# Patient Record
Sex: Female | Born: 1955
Health system: Southern US, Community
[De-identification: ages and names within clinical notes are randomized; demographics above are authoritative.]

## PROBLEM LIST (undated history)

## (undated) DIAGNOSIS — F1910 Other psychoactive substance abuse, uncomplicated: Secondary | ICD-10-CM

## (undated) DIAGNOSIS — K449 Diaphragmatic hernia without obstruction or gangrene: Secondary | ICD-10-CM

## (undated) DIAGNOSIS — M797 Fibromyalgia: Secondary | ICD-10-CM

## (undated) DIAGNOSIS — K828 Other specified diseases of gallbladder: Secondary | ICD-10-CM

## (undated) DIAGNOSIS — B182 Chronic viral hepatitis C: Secondary | ICD-10-CM

## (undated) DIAGNOSIS — G43909 Migraine, unspecified, not intractable, without status migrainosus: Secondary | ICD-10-CM

## (undated) DIAGNOSIS — M1990 Unspecified osteoarthritis, unspecified site: Secondary | ICD-10-CM

## (undated) DIAGNOSIS — G119 Hereditary ataxia, unspecified: Secondary | ICD-10-CM

## (undated) DIAGNOSIS — K7460 Unspecified cirrhosis of liver: Secondary | ICD-10-CM

## (undated) DIAGNOSIS — D472 Monoclonal gammopathy: Secondary | ICD-10-CM

## (undated) DIAGNOSIS — F418 Other specified anxiety disorders: Secondary | ICD-10-CM

## (undated) DIAGNOSIS — R270 Ataxia, unspecified: Secondary | ICD-10-CM

## (undated) DIAGNOSIS — K766 Portal hypertension: Secondary | ICD-10-CM

## (undated) HISTORY — DX: Fibromyalgia: M79.7

## (undated) HISTORY — DX: Monoclonal gammopathy: D47.2

## (undated) HISTORY — DX: Other psychoactive substance abuse, uncomplicated: F19.10

## (undated) HISTORY — DX: Diaphragmatic hernia without obstruction or gangrene: K44.9

## (undated) HISTORY — DX: Unspecified cirrhosis of liver: K74.60

## (undated) HISTORY — DX: Chronic viral hepatitis C: B18.2

## (undated) HISTORY — DX: Other specified anxiety disorders: F41.8

## (undated) HISTORY — DX: Other specified diseases of gallbladder: K82.8

## (undated) HISTORY — DX: Portal hypertension: K76.6

## (undated) HISTORY — DX: Migraine, unspecified, not intractable, without status migrainosus: G43.909

## (undated) HISTORY — DX: Hereditary ataxia, unspecified: G11.9

## (undated) HISTORY — DX: Unspecified osteoarthritis, unspecified site: M19.90

---

## 1977-10-06 HISTORY — PX: TUBAL LIGATION: SHX77

## 1994-10-06 HISTORY — PX: BREAST ENHANCEMENT SURGERY: SHX7

## 2009-10-06 HISTORY — PX: ANKLE SURGERY: SHX546

## 2014-10-06 HISTORY — PX: WRIST SURGERY: SHX841

## 2019-08-06 ENCOUNTER — Emergency Department (HOSPITAL_COMMUNITY)
Admission: EM | Admit: 2019-08-06 | Discharge: 2019-08-06 | Disposition: A | Discharge status: Home / Self Care | Payer: Medicare HMO | Attending: Emergency Medicine | Admitting: Emergency Medicine

## 2019-08-06 ENCOUNTER — Emergency Department (HOSPITAL_COMMUNITY): Payer: Medicare HMO

## 2019-08-06 ENCOUNTER — Encounter (HOSPITAL_COMMUNITY): Payer: Self-pay | Admitting: Emergency Medicine

## 2019-08-06 ENCOUNTER — Other Ambulatory Visit: Payer: Self-pay

## 2019-08-06 DIAGNOSIS — Y9389 Activity, other specified: Secondary | ICD-10-CM | POA: Insufficient documentation

## 2019-08-06 DIAGNOSIS — R519 Headache, unspecified: Secondary | ICD-10-CM | POA: Insufficient documentation

## 2019-08-06 DIAGNOSIS — R260 Ataxic gait: Secondary | ICD-10-CM | POA: Diagnosis not present

## 2019-08-06 DIAGNOSIS — R42 Dizziness and giddiness: Secondary | ICD-10-CM | POA: Insufficient documentation

## 2019-08-06 DIAGNOSIS — Z79899 Other long term (current) drug therapy: Secondary | ICD-10-CM | POA: Diagnosis not present

## 2019-08-06 DIAGNOSIS — Z91040 Latex allergy status: Secondary | ICD-10-CM | POA: Insufficient documentation

## 2019-08-06 DIAGNOSIS — R0789 Other chest pain: Secondary | ICD-10-CM | POA: Insufficient documentation

## 2019-08-06 DIAGNOSIS — Z043 Encounter for examination and observation following other accident: Secondary | ICD-10-CM | POA: Diagnosis not present

## 2019-08-06 DIAGNOSIS — Y999 Unspecified external cause status: Secondary | ICD-10-CM | POA: Insufficient documentation

## 2019-08-06 DIAGNOSIS — W010XXA Fall on same level from slipping, tripping and stumbling without subsequent striking against object, initial encounter: Secondary | ICD-10-CM | POA: Diagnosis not present

## 2019-08-06 DIAGNOSIS — Y92039 Unspecified place in apartment as the place of occurrence of the external cause: Secondary | ICD-10-CM | POA: Diagnosis not present

## 2019-08-06 DIAGNOSIS — W19XXXA Unspecified fall, initial encounter: Secondary | ICD-10-CM

## 2019-08-06 DIAGNOSIS — T148XXA Other injury of unspecified body region, initial encounter: Secondary | ICD-10-CM | POA: Insufficient documentation

## 2019-08-06 HISTORY — DX: Ataxia, unspecified: R27.0

## 2019-08-06 NOTE — ED Triage Notes (Signed)
Pt was carrying a laundry basket in the rain, tripped and fell 10/29. Denies LOC, dizziness, blurry vision. Pt has facial edema, redness, and lac to R face and forehead. Pt also has bruising on R rib cage. Endorses headache and neck pain. Pt says she had 1 emesis episode on same day she fell, has not had another since.

## 2019-08-06 NOTE — ED Provider Notes (Signed)
[facility] EMERGENCY DEPARTMENT Provider Note   CSN: [id number] Arrival date & time: 08/06/19  1639     History   Chief Complaint Chief Complaint  Patient presents with   Fall    HPI Kelly Singh is a 63 y.o. female with past medical history significant for ataxia who presents to the ED after sustaining an unwitnessed mechanical fall on 08/04/2019.  She reports that she was carrying a laundry basket into her condo 2 days ago when she tripped and fell.  She admits that she frequently sustains these mechanical falls due to her diagnosed ataxic neurologic condition.  She had been seeing a neurologist in [place], but has not been established with a neurologist since moving to [place] earlier this year.  She also does not have a PCP at the moment.  She endorses worsening dizziness, one episode of nausea and vomiting, and an unrelenting headache, but denies any visual changes, loss of consciousness, memory disturbance, seizure activity, incontinence, tongue biting, confusion, altered mental status, or any new or worsening neurologic deficits.  She sustained abrasions to her right cheek, right forehead, and right forearm.  She also complains of right-sided rib discomfort.  She denies any difficulty breathing or pain with inspiration.  She is accompanied by her daughter who is most concerned for a brain bleed due to her worsening dizziness and persistent headache.  Daughter insisted that her mother be evaluated on the day of her injury, but she has been reluctant.  Patient reports that her pain is a 5 out of 10 and she has not taken anything for her symptoms.  She reports that she had irrigated and even debrided her abrasions.     HPI  Past Medical History:  Diagnosis Date   Ataxia     There are no active problems to display for this patient.   History reviewed. No pertinent surgical history.   OB History   No obstetric history on file.      Home  Medications    Prior to Admission medications   Medication Sig Start Date End Date Taking? Authorizing Provider  Apoaequorin (PREVAGEN PO) Take 1 tablet by mouth daily.   Yes [provider]  HYDROcodone-acetaminophen (NORCO) 10-325 MG tablet Take 1 tablet by mouth every 4 (four) hours as needed (pain).   Yes [provider]  Multiple Vitamin (MULTIVITAMIN WITH MINERALS) TABS tablet Take 1 tablet by mouth daily.   Yes [provider]  sertraline (ZOLOFT) 50 MG tablet Take 50 mg by mouth every morning.   Yes [provider]  vitamin E 1000 UNIT capsule Take 1,000 Units by mouth daily.   Yes [provider]    Family History History reviewed. No pertinent family history.  Social History Social History   Tobacco Use   Smoking status: Never Smoker   Smokeless tobacco: Never Used  Substance Use Topics   Alcohol use: Never    Frequency: Never   Drug use: Not on file     Allergies   Adhesive [tape] and Latex   Review of Systems Review of Systems  All other systems reviewed and are negative.    Physical Exam Updated Vital Signs BP 122/72    Pulse 73    Temp (!) 97.5 F (36.4 C) (Oral)    Resp 16    Ht 5\' 6"  (1.676 m)    Wt 61.2 kg    SpO2 97%    BMI 21.79 kg/m   Physical Exam Vitals signs  and nursing note reviewed. Exam conducted with a chaperone present.  Constitutional:      Appearance: Normal appearance.  HENT:     Head: Normocephalic.     Comments: No palpable skull defect, battle sign, hemotympanum, or other evidence of basilar skull fracture.  No CSF otorrhea or rhinorrhea.    Nose: Nose normal.     Mouth/Throat:     Pharynx: Oropharynx is clear.  Eyes:     Comments: Right eye: Significant ecchymoses and surrounding abrasions.  No nystagmus.  EOMs intact.  PERRL.  Neck:     Comments: Range of motion fully intact.  No significant midline tenderness to palpation, but mild TTP over trapezial regions  bilaterally. Pulmonary:     Effort: Pulmonary effort is normal.  Musculoskeletal:     Comments: Torso: Tenderness to palpation focally over lateral aspect 6 through 8 ribs on right side of chest wall.  Right forearm: 10 cm well-healing abrasion.  No evidence of surrounding erythema, swelling, induration, or other evidence of infection.  Skin:    General: Skin is dry.  Neurological:     General: No focal deficit present.     Mental Status: She is alert.     GCS: GCS eye subscore is 4. GCS verbal subscore is 5. GCS motor subscore is 6.     Cranial Nerves: No cranial nerve deficit.     Sensory: No sensory deficit.     Motor: No weakness.     Coordination: Coordination normal.     Comments: Ataxic gait.  Psychiatric:        Mood and Affect: Mood normal.        Behavior: Behavior normal.        Thought Content: Thought content normal.      ED Treatments / Results  Labs (all labs ordered are listed, but only abnormal results are displayed) Labs Reviewed - No data to display  EKG None  Radiology Ct Head Wo Contrast  Result Date: 08/06/2019 CLINICAL DATA:  Acute pain due to trauma. Facial edema with laceration to right face and forehead. EXAM: CT HEAD WITHOUT CONTRAST CT MAXILLOFACIAL WITHOUT CONTRAST CT CERVICAL SPINE WITHOUT CONTRAST TECHNIQUE: Multidetector CT imaging of the head, cervical spine, and maxillofacial structures were performed using the standard protocol without intravenous contrast. Multiplanar CT image reconstructions of the cervical spine and maxillofacial structures were also generated. COMPARISON:  None. FINDINGS: CT HEAD FINDINGS Brain: No evidence of acute infarction, hemorrhage, hydrocephalus, extra-axial collection or mass lesion/mass effect. Vascular: No hyperdense vessel or unexpected calcification. Skull: Normal. Negative for fracture or focal lesion. Other: None. CT MAXILLOFACIAL FINDINGS Osseous: No fracture or mandibular dislocation. No destructive process.  Orbits: Negative. No traumatic or inflammatory finding. Sinuses: Clear. Soft tissues: There is right facial soft tissue swelling without evidence for a radiopaque foreign body. There is right periorbital soft tissue swelling without evidence for an underlying fracture. CT CERVICAL SPINE FINDINGS Alignment: Normal. Skull base and vertebrae: No acute fracture. No primary bone lesion or focal pathologic process. Soft tissues and spinal canal: No prevertebral fluid or swelling. No visible canal hematoma. Disc levels: Multilevel disc height loss is noted throughout the cervical spine. Upper chest: Negative. Other: None IMPRESSION: 1. No acute intracranial abnormality detected. 2. No acute cervical spine fracture. 3. Right facial soft tissue swelling without evidence for an associated fracture or radiopaque foreign body. Electronically Signed   By: Constance Holster M.D.   On: 08/06/2019 20:48   Ct Chest Wo Contrast  Result  Date: 08/06/2019 CLINICAL DATA:  Pain after fall.  Suspected rib fractures. EXAM: CT CHEST WITHOUT CONTRAST TECHNIQUE: Multidetector CT imaging of the chest was performed following the standard protocol without IV contrast. COMPARISON:  None. FINDINGS: Cardiovascular: The thoracic aorta is nonaneurysmal without significant atherosclerotic change. The heart size is normal. No obvious coronary artery calcifications. Central pulmonary arteries are normal in caliber. Mediastinum/Nodes: The thyroid and esophagus are normal. No adenopathy seen in the chest. No effusions. The patient is status post bilateral implant placements. Lungs/Pleura: Central airways are normal. No pneumothorax. Several ground-glass nodules are seen in the lungs. A representative ground-glass nodule in the left apex measures 6 mm on series 4, image 23. A few other smaller ground-glass nodules are seen in the right upper lobe. No solid nodules are seen on today's study. No focal infiltrates are noted. Upper Abdomen: A cyst in the  upper left abdomen is probably in the superior pole of the left kidney is seen on series 3, image 143, incompletely characterized. No other abnormalities in the upper abdomen identified. Musculoskeletal: There appears to be a healed posterior eleventh left rib fracture as seen on series 4, image 114. There may also be a healed left posterior tenth rib fracture with callus formation. No acute fractures are seen. Degenerative changes are seen in the thoracic spine without an identified fracture. IMPRESSION: 1. No acute rib fractures are noted. There appear to be healed rib fractures with callus formation in the posterior left tenth and eleventh ribs. No pneumothorax. 2. Several ground-glass nodules are seen in the lung apices with a representative nodule in the left measuring 6 mm. Non-contrast chest CT at 3-6 months is recommended. If nodules persist, subsequent management will be based upon the most suspicious nodule(s). This recommendation follows the consensus statement: Guidelines for Management of Incidental Pulmonary Nodules Detected on CT Images: From the Fleischner Society 2017; Radiology 2017; 284:228-243. 3. Probable cyst in the upper pole the left kidney, incompletely characterized. 4. No other acute abnormalities. Electronically Signed   By: Dorise Bullion III M.D   On: 08/06/2019 20:48   Ct Cervical Spine Wo Contrast  Result Date: 08/06/2019 CLINICAL DATA:  Acute pain due to trauma. Facial edema with laceration to right face and forehead. EXAM: CT HEAD WITHOUT CONTRAST CT MAXILLOFACIAL WITHOUT CONTRAST CT CERVICAL SPINE WITHOUT CONTRAST TECHNIQUE: Multidetector CT imaging of the head, cervical spine, and maxillofacial structures were performed using the standard protocol without intravenous contrast. Multiplanar CT image reconstructions of the cervical spine and maxillofacial structures were also generated. COMPARISON:  None. FINDINGS: CT HEAD FINDINGS Brain: No evidence of acute infarction,  hemorrhage, hydrocephalus, extra-axial collection or mass lesion/mass effect. Vascular: No hyperdense vessel or unexpected calcification. Skull: Normal. Negative for fracture or focal lesion. Other: None. CT MAXILLOFACIAL FINDINGS Osseous: No fracture or mandibular dislocation. No destructive process. Orbits: Negative. No traumatic or inflammatory finding. Sinuses: Clear. Soft tissues: There is right facial soft tissue swelling without evidence for a radiopaque foreign body. There is right periorbital soft tissue swelling without evidence for an underlying fracture. CT CERVICAL SPINE FINDINGS Alignment: Normal. Skull base and vertebrae: No acute fracture. No primary bone lesion or focal pathologic process. Soft tissues and spinal canal: No prevertebral fluid or swelling. No visible canal hematoma. Disc levels: Multilevel disc height loss is noted throughout the cervical spine. Upper chest: Negative. Other: None IMPRESSION: 1. No acute intracranial abnormality detected. 2. No acute cervical spine fracture. 3. Right facial soft tissue swelling without evidence for an associated fracture  or radiopaque foreign body. Electronically Signed   By: Constance Holster M.D.   On: 08/06/2019 20:48   Ct Maxillofacial Wo Contrast  Result Date: 08/06/2019 CLINICAL DATA:  Acute pain due to trauma. Facial edema with laceration to right face and forehead. EXAM: CT HEAD WITHOUT CONTRAST CT MAXILLOFACIAL WITHOUT CONTRAST CT CERVICAL SPINE WITHOUT CONTRAST TECHNIQUE: Multidetector CT imaging of the head, cervical spine, and maxillofacial structures were performed using the standard protocol without intravenous contrast. Multiplanar CT image reconstructions of the cervical spine and maxillofacial structures were also generated. COMPARISON:  None. FINDINGS: CT HEAD FINDINGS Brain: No evidence of acute infarction, hemorrhage, hydrocephalus, extra-axial collection or mass lesion/mass effect. Vascular: No hyperdense vessel or unexpected  calcification. Skull: Normal. Negative for fracture or focal lesion. Other: None. CT MAXILLOFACIAL FINDINGS Osseous: No fracture or mandibular dislocation. No destructive process. Orbits: Negative. No traumatic or inflammatory finding. Sinuses: Clear. Soft tissues: There is right facial soft tissue swelling without evidence for a radiopaque foreign body. There is right periorbital soft tissue swelling without evidence for an underlying fracture. CT CERVICAL SPINE FINDINGS Alignment: Normal. Skull base and vertebrae: No acute fracture. No primary bone lesion or focal pathologic process. Soft tissues and spinal canal: No prevertebral fluid or swelling. No visible canal hematoma. Disc levels: Multilevel disc height loss is noted throughout the cervical spine. Upper chest: Negative. Other: None IMPRESSION: 1. No acute intracranial abnormality detected. 2. No acute cervical spine fracture. 3. Right facial soft tissue swelling without evidence for an associated fracture or radiopaque foreign body. Electronically Signed   By: Constance Holster M.D.   On: 08/06/2019 20:48    Procedures Procedures (including critical care time)  Medications Ordered in ED Medications - No data to display   Initial Impression / Assessment and Plan / ED Course  I have reviewed the triage vital signs and the nursing notes.  Pertinent labs & imaging results that were available during my care of the patient were reviewed by me and considered in my medical decision making (see chart for details).        While a muscle relaxer such as Robaxin would help her with her trapezial discomfort, discussed why I did not feel would be a wise decision based on her frequent falls and ataxia.  Also would like to avoid narcotics given their sedating nature.  Instead, will prescribe Tylenol 500 mg as needed for any continued pain and discomfort.  Patient had not yet taken anything for her pain and was agreeable to this plan.  Patient's  abrasions are healing well and demonstrate no evidence of erythema, swelling, induration, or other signs of infections.  Will provide information for continued wound management and provided them with return precautions.  Do not feel as though prophylactic antibiotics are warranted at this time.  Reviewed and interpreted interpreted CT imaging obtained at the head, maxillofacial, C-spine, and chest which were reviewed no acute bleeds, fractures, subluxation, or dislocation. Her CT chest did however-year-old several groundglass nodules in the lung apices with a nodule measuring 6 mm and that radiology recommends repeat noncontrast CT chest in 3 to 6 months.  Informed the patient and her daughter and they voiced understanding and agreement to the plan.  Her daughter plans to get the patient established with a neurologist for her ongoing ataxic neurologic disorder and patient agrees to get established with a PCP here in [place].  Patient was relieved that there were no acute abnormalities.  She declined any PO pain medications for discharge.  Instructed patient to return to the ED or seek medical attention for any new or worsening symptoms.  Final Clinical Impressions(s) / ED Diagnoses   Final diagnoses:  Fall, initial encounter    ED Discharge Orders    None       Reita Chard 08/06/19 2113    Milton Ferguson, MD 08/07/19 1253

## 2019-08-06 NOTE — Discharge Instructions (Addendum)
Return to ED or seek medical attention for any new or worsening symptoms.  Please have a repeat noncontrast chest CT in 3 to 6 months to reevaluate the incidental 6 mm lung nodule.  Be sure to get established with a PCP as well as a neurologist for your ongoing ataxic neurologic disorder as soon as possible.

## 2019-08-29 ENCOUNTER — Other Ambulatory Visit: Payer: Self-pay

## 2019-08-29 DIAGNOSIS — Z20822 Contact with and (suspected) exposure to covid-19: Secondary | ICD-10-CM

## 2019-08-30 LAB — NOVEL CORONAVIRUS, NAA: SARS-CoV-2, NAA: NOT DETECTED

## 2019-09-02 ENCOUNTER — Telehealth: Payer: Self-pay | Admitting: *Deleted

## 2019-09-02 NOTE — Telephone Encounter (Signed)
Patient called and given negative covid results .

## 2019-09-23 ENCOUNTER — Encounter: Payer: Self-pay | Admitting: Oncology

## 2019-09-23 ENCOUNTER — Telehealth: Payer: Self-pay | Admitting: Oncology

## 2019-09-23 NOTE — Telephone Encounter (Signed)
A new hem appt has been scheduled for Kelly Singh to see Dr. Alen Blew on 1/7 at 11am. Pt aware to arrive 15 minutes early. Letter mailed.

## 2019-10-13 ENCOUNTER — Other Ambulatory Visit: Payer: Self-pay

## 2019-10-13 ENCOUNTER — Inpatient Hospital Stay: Payer: Medicare Other | Attending: Oncology | Admitting: Oncology

## 2019-10-13 ENCOUNTER — Inpatient Hospital Stay: Payer: Medicare Other

## 2019-10-13 VITALS — BP 152/79 | HR 70 | Temp 97.7°F | Resp 16 | Ht 66.0 in | Wt 135.1 lb

## 2019-10-13 DIAGNOSIS — F329 Major depressive disorder, single episode, unspecified: Secondary | ICD-10-CM | POA: Insufficient documentation

## 2019-10-13 DIAGNOSIS — D472 Monoclonal gammopathy: Secondary | ICD-10-CM | POA: Diagnosis not present

## 2019-10-13 DIAGNOSIS — Z79899 Other long term (current) drug therapy: Secondary | ICD-10-CM | POA: Diagnosis not present

## 2019-10-13 DIAGNOSIS — F419 Anxiety disorder, unspecified: Secondary | ICD-10-CM | POA: Diagnosis not present

## 2019-10-13 DIAGNOSIS — R270 Ataxia, unspecified: Secondary | ICD-10-CM | POA: Diagnosis not present

## 2019-10-13 DIAGNOSIS — R911 Solitary pulmonary nodule: Secondary | ICD-10-CM | POA: Diagnosis not present

## 2019-10-13 LAB — CBC WITH DIFFERENTIAL (CANCER CENTER ONLY)
Abs Immature Granulocytes: 0.01 10*3/uL (ref 0.00–0.07)
Basophils Absolute: 0 10*3/uL (ref 0.0–0.1)
Basophils Relative: 1 %
Eosinophils Absolute: 0.1 10*3/uL (ref 0.0–0.5)
Eosinophils Relative: 3 %
HCT: 43.7 % (ref 36.0–46.0)
Hemoglobin: 14.7 g/dL (ref 12.0–15.0)
Immature Granulocytes: 0 %
Lymphocytes Relative: 37 %
Lymphs Abs: 1.1 10*3/uL (ref 0.7–4.0)
MCH: 32.8 pg (ref 26.0–34.0)
MCHC: 33.6 g/dL (ref 30.0–36.0)
MCV: 97.5 fL (ref 80.0–100.0)
Monocytes Absolute: 0.2 10*3/uL (ref 0.1–1.0)
Monocytes Relative: 7 %
Neutro Abs: 1.5 10*3/uL — ABNORMAL LOW (ref 1.7–7.7)
Neutrophils Relative %: 52 %
Platelet Count: 99 10*3/uL — ABNORMAL LOW (ref 150–400)
RBC: 4.48 MIL/uL (ref 3.87–5.11)
RDW: 13.8 % (ref 11.5–15.5)
WBC Count: 3 10*3/uL — ABNORMAL LOW (ref 4.0–10.5)
nRBC: 0 % (ref 0.0–0.2)

## 2019-10-13 LAB — CMP (CANCER CENTER ONLY)
ALT: 63 U/L — ABNORMAL HIGH (ref 0–44)
AST: 68 U/L — ABNORMAL HIGH (ref 15–41)
Albumin: 3.3 g/dL — ABNORMAL LOW (ref 3.5–5.0)
Alkaline Phosphatase: 146 U/L — ABNORMAL HIGH (ref 38–126)
Anion gap: 7 (ref 5–15)
BUN: 11 mg/dL (ref 8–23)
CO2: 28 mmol/L (ref 22–32)
Calcium: 8 mg/dL — ABNORMAL LOW (ref 8.9–10.3)
Chloride: 104 mmol/L (ref 98–111)
Creatinine: 0.73 mg/dL (ref 0.44–1.00)
GFR, Est AFR Am: >60 mL/min (ref >60)
GFR, Estimated: >60 mL/min (ref >60)
Glucose, Bld: 98 mg/dL (ref 70–99)
Potassium: 4 mmol/L (ref 3.5–5.1)
Sodium: 139 mmol/L (ref 135–145)
Total Bilirubin: 2.1 mg/dL — ABNORMAL HIGH (ref 0.3–1.2)
Total Protein: 7.1 g/dL (ref 6.5–8.1)

## 2019-10-13 NOTE — Progress Notes (Signed)
Reason for the request:   Plasma cell disorder  HPI: I was asked by Dr. Dr. Arline Asp to evaluate Ms. Hansley for the evaluation of MGUS.  She is 64 year old woman with history of anxiety and depression as well as ataxia who established care here recently.  She was also with seen in the emergency department on August 06, 2019 after she sustained a fall related to her ataxia.  Imaging studies including a CT scan of the cervical spine, chest, and  maxillofacial CT scan showed no evidence of fractures.  She did have 6 mm nodule in the left lung associated with groundglass opacities.  No evidence of any malignancy noted at this time.  She reports has a history of fall MGUS that is a biclonal dating back to 2011.  She has relocated from [place] but also lived in [place] where she was diagnosed with this condition.  She has not required any treatment and has been on observation and surveillance.  She does not have all her records at this time but does indicate that she has had a biclonal gammopathy at that time.  She does not report any bone pain or pathological fractures at this time.  She denies any recurrent infections or worsening neuropathy.  She still have chronic instability related to her ataxia and neurological condition.   She does not report any headaches, blurry vision, syncope or seizures. Does not report any fevers, chills or sweats.  Does not report any cough, wheezing or hemoptysis.  Does not report any chest pain, palpitation, orthopnea or leg edema.  Does not report any nausea, vomiting or abdominal pain.  Does not report any constipation or diarrhea.  Does not report any skeletal complaints.    Does not report frequency, urgency or hematuria.  Does not report any skin rashes or lesions. Does not report any heat or cold intolerance.  Does not report any lymphadenopathy or petechiae.  Does not report any anxiety or depression.  Remaining review of systems is negative.    Past Medical History:   Diagnosis Date  . Ataxia   :  No past surgical history on file.:   Current Outpatient Medications:  .  Apoaequorin (PREVAGEN PO), Take 1 tablet by mouth daily., Disp: , Rfl:  .  HYDROcodone-acetaminophen (NORCO) 10-325 MG tablet, Take 1 tablet by mouth every 4 (four) hours as needed (pain)., Disp: , Rfl:  .  Multiple Vitamin (MULTIVITAMIN WITH MINERALS) TABS tablet, Take 1 tablet by mouth daily., Disp: , Rfl:  .  sertraline (ZOLOFT) 50 MG tablet, Take 50 mg by mouth every morning., Disp: , Rfl:  .  vitamin E 1000 UNIT capsule, Take 1,000 Units by mouth daily., Disp: , Rfl: :  Allergies  Allergen Reactions  . Adhesive [Tape] Other (See Comments)    Plastic tape eats through skin - pls use paper tape  . Latex Other (See Comments)    Eats through skin  :  No family history on file.:  Social History   Socioeconomic History  . Marital status: Single    Spouse name: Not on file  . Number of children: Not on file  . Years of education: Not on file  . Highest education level: Not on file  Occupational History  . Not on file  Tobacco Use  . Smoking status: Never Smoker  . Smokeless tobacco: Never Used  Substance and Sexual Activity  . Alcohol use: Never  . Drug use: Not on file  . Sexual activity: Not on  file  Other Topics Concern  . Not on file  Social History Narrative  . Not on file   Social Determinants of Health   Financial Resource Strain:   . Difficulty of Paying Living Expenses: Not on file  Food Insecurity:   . Worried About Charity fundraiser in the Last Year: Not on file  . Ran Out of Food in the Last Year: Not on file  Transportation Needs:   . Lack of Transportation (Medical): Not on file  . Lack of Transportation (Non-Medical): Not on file  Physical Activity:   . Days of Exercise per Week: Not on file  . Minutes of Exercise per Session: Not on file  Stress:   . Feeling of Stress : Not on file  Social Connections:   . Frequency of Communication  with Friends and Family: Not on file  . Frequency of Social Gatherings with Friends and Family: Not on file  . Attends Religious Services: Not on file  . Active Member of Clubs or Organizations: Not on file  . Attends Archivist Meetings: Not on file  . Marital Status: Not on file  Intimate Partner Violence:   . Fear of Current or Ex-Partner: Not on file  . Emotionally Abused: Not on file  . Physically Abused: Not on file  . Sexually Abused: Not on file  :  Pertinent items are noted in HPI.  Exam: Blood pressure (!) 152/79, pulse 70, temperature 97.7 F (36.5 C), temperature source Temporal, resp. rate 16, height _0  (1.676 m), weight 135 lb 1.6 oz (61.3 kg), SpO2 99 %.  ECOG 1  General appearance: alert and cooperative appeared without distress. Head: atraumatic without any abnormalities. Eyes: conjunctivae/corneas clear. PERRL.  Sclera anicteric. Throat: lips, mucosa, and tongue normal; without oral thrush or ulcers. Resp: clear to auscultation bilaterally without rhonchi, wheezes or dullness to percussion. Cardio: regular rate and rhythm, S1, S2 normal, no murmur, click, rub or gallop GI: soft, non-tender; bowel sounds normal; no masses,  no organomegaly Skin: Skin color, texture, turgor normal. No rashes or lesions Lymph nodes: Cervical, supraclavicular, and axillary nodes normal. Neurologic: Grossly normal without any motor, sensory or deep tendon reflexes. Musculoskeletal: No joint deformity or effusion.  IMPRESSION: 1. No acute rib fractures are noted. There appear to be healed rib fractures with callus formation in the posterior left tenth and eleventh ribs. No pneumothorax. 2. Several ground-glass nodules are seen in the lung apices with a representative nodule in the left measuring 6 mm. Non-contrast chest CT at 3-6 months is recommended. If nodules persist, subsequent management will be based upon the most suspicious nodule(s). This recommendation  follows the consensus statement: Guidelines for Management of Incidental Pulmonary Nodules Detected on CT Images: From the Fleischner Society 2017; Radiology 2017; 284:228-243. 3. Probable cyst in the upper pole the left kidney, incompletely characterized. 4. No other acute abnormalities.   Assessment and Plan:   64 year old woman with:  1.   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  She has been on active surveillance and has not required any treatment or any indication that she has developed multiple myeloma or symptomatic plasma cell disorder.  She relocated from [facility] and currently here to establish care.  The natural course of this disease as well as differential diagnosis was reviewed today with the patient and her daughter.  Biclonal gammopathy of undetermined significance is a possibility.  Reactive elevation in her immunoglobulins related to autoimmune disease  could also be a consideration.  Multiple myeloma as well as amyloidosis are considered less likely at this time.  I have recommended continued active surveillance with repeat serum protein electrophoresis as well as quantitative immunoglobulins and serum light chains in addition to a CBC and chemistries.  The interventional surveillance can range between 3 to 12 months.  We will start with 3 months and increase the frequency pending the stability of her counts.  She reports that she had a bone marrow biopsy in 2016 I see no indication to repeat it at this time.  2.  Lung nodules noted on a CT scan in October 2020.  She had a 6 mm left lung which could represent malignancy at this time.  The plan is to repeat imaging studies in 3 months from now which would be 6 months from her previous one to ensure stability.  3.  Follow-up: Will be in 3 months for repeat evaluation.  45  minutes was spent with on this encounter.  The time was dedicated to reviewing imaging studies, discussing  differential diagnosis and management options for the future.   Thank you for the referral. A copy of this consult has been forwarded to the requesting physician.

## 2019-10-14 ENCOUNTER — Telehealth: Payer: Self-pay | Admitting: Oncology

## 2019-10-14 LAB — KAPPA/LAMBDA LIGHT CHAINS
Kappa free light chain: 29.7 mg/L — ABNORMAL HIGH (ref 3.3–19.4)
Kappa, lambda light chain ratio: 1.87 — ABNORMAL HIGH (ref 0.26–1.65)
Lambda free light chains: 15.9 mg/L (ref 5.7–26.3)

## 2019-10-14 NOTE — Telephone Encounter (Signed)
Scheduled appt per 1/7 los.  Sent a message to HIM pool to get a calendar mailed out. 

## 2019-10-17 LAB — MULTIPLE MYELOMA PANEL, SERUM
Albumin SerPl Elph-Mcnc: 3.2 g/dL (ref 2.9–4.4)
Albumin/Glob SerPl: 0.9 (ref 0.7–1.7)
Alpha 1: 0.2 g/dL (ref 0.0–0.4)
Alpha2 Glob SerPl Elph-Mcnc: 0.5 g/dL (ref 0.4–1.0)
B-Globulin SerPl Elph-Mcnc: 0.6 g/dL — ABNORMAL LOW (ref 0.7–1.3)
Gamma Glob SerPl Elph-Mcnc: 2.5 g/dL — ABNORMAL HIGH (ref 0.4–1.8)
Globulin, Total: 3.8 g/dL (ref 2.2–3.9)
IgA: 194 mg/dL (ref 87–352)
IgG (Immunoglobin G), Serum: 2459 mg/dL — ABNORMAL HIGH (ref 586–1602)
IgM (Immunoglobulin M), Srm: 166 mg/dL (ref 26–217)
M Protein SerPl Elph-Mcnc: 1 g/dL — ABNORMAL HIGH
Total Protein ELP: 7 g/dL (ref 6.0–8.5)

## 2019-10-18 ENCOUNTER — Other Ambulatory Visit: Payer: Self-pay | Admitting: Internal Medicine

## 2019-10-18 ENCOUNTER — Other Ambulatory Visit (HOSPITAL_COMMUNITY): Payer: Self-pay | Admitting: Internal Medicine

## 2019-10-18 DIAGNOSIS — R1310 Dysphagia, unspecified: Secondary | ICD-10-CM

## 2019-10-25 ENCOUNTER — Other Ambulatory Visit: Payer: Self-pay

## 2019-10-25 ENCOUNTER — Ambulatory Visit (HOSPITAL_COMMUNITY)
Admission: RE | Admit: 2019-10-25 | Discharge: 2019-10-25 | Disposition: A | Discharge status: Home / Self Care | Payer: Medicare Other | Source: Ambulatory Visit | Attending: Internal Medicine | Admitting: Internal Medicine

## 2019-10-25 DIAGNOSIS — R1310 Dysphagia, unspecified: Secondary | ICD-10-CM | POA: Insufficient documentation

## 2019-11-01 ENCOUNTER — Encounter: Payer: Self-pay | Admitting: Family

## 2019-11-01 ENCOUNTER — Other Ambulatory Visit: Payer: Self-pay

## 2019-11-01 ENCOUNTER — Telehealth: Payer: Self-pay | Admitting: Pharmacy Technician

## 2019-11-01 ENCOUNTER — Ambulatory Visit (INDEPENDENT_AMBULATORY_CARE_PROVIDER_SITE_OTHER): Payer: Medicare Other | Admitting: Family

## 2019-11-01 VITALS — BP 130/87 | HR 68 | Temp 98.2°F | Ht 66.0 in | Wt 134.0 lb

## 2019-11-01 DIAGNOSIS — B182 Chronic viral hepatitis C: Secondary | ICD-10-CM | POA: Diagnosis not present

## 2019-11-01 NOTE — Assessment & Plan Note (Signed)
Kelly Singh is a 64 y/o female with chronic hepatitis C without hepatic coma with initial viral load of 2 million. She is treatment naive and is currently asymptomatic with no previous personal or family history of liver disease. Largest risk factor appears to be age although does have tattoos and has shared razors in the past. Discussed the pathophysiology, transmission, prevention, risks if left untreated and treatment options for Hepatitis C. We discussed the plan of care with time for questions. Will check blood work today. She met with our pharmacy staff for medication assistance. Will plan for treatment with Epclusa or Mavyret. Plan for follow up in 1 month after starting medication.

## 2019-11-01 NOTE — Progress Notes (Signed)
Subjective:    Patient ID: Kelly Singh, female    DOB: 07-27-1956, 64 y.o.   MRN: [id number]  Chief Complaint  Patient presents with  . Hepatitis C    HPI:  Kelly Singh is a 64 y.o. female with previous medical history significant for anxiety, depression, cerebellar ataxia, lung nodule, and monoclonal gammopathy presenting today for evaluation and treatment of hepatitis C.  Kelly Singh was recently seen by her primary care team for routine medical care with screening for hepatitis C as she was born between [birth year] and 1965.  Her hepatitis C antibody returned positive with additional testing showing a viral load of 2 million.  This was the first that she has been diagnosed with hepatitis C.  Risk factors include tattoos and sharing of razors in the past.  Denies any previous IV drug use, blood transfusion prior to 1992, or sexual contact with a known positive partner. She has had botox injections in the past as well.  She has not received treatment for hepatitis C since initial diagnosis.  No personal or family history of liver disease.  Currently asymptomatic and denies any previous jaundice, scleral icterus, or other gastrointestinal symptoms.  No recreational or illicit drug use or tobacco use.  Does drink alcohol on occasion.  Her current hepatitis B status is unknown.  Allergies  Allergen Reactions  . Adhesive [Tape] Other (See Comments)    Plastic tape eats through skin - pls use paper tape  . Latex Other (See Comments)    Eats through skin      Outpatient Medications Prior to Visit  Medication Sig Dispense Refill  . baclofen (LIORESAL) 20 MG tablet Take 20 mg by mouth 3 (three) times daily.    Marland Kitchen HYDROcodone-acetaminophen (NORCO) 10-325 MG tablet Take 1 tablet by mouth every 4 (four) hours as needed (pain).    . Multiple Vitamin (MULTIVITAMIN WITH MINERALS) TABS tablet Take 1 tablet by mouth daily.    . sertraline (ZOLOFT) 50 MG tablet Take 50 mg by mouth every morning.     Marland Kitchen Apoaequorin (PREVAGEN PO) Take 1 tablet by mouth daily.    . vitamin E 1000 UNIT capsule Take 1,000 Units by mouth daily.     No facility-administered medications prior to visit.     Past Medical History:  Diagnosis Date  . Ataxia       History reviewed. No pertinent surgical history.    Family History  Problem Relation Age of Onset  . Cancer Mother   . Cancer Father       Social History   Socioeconomic History  . Marital status: Single    Spouse name: Not on file  . Number of children: Not on file  . Years of education: Not on file  . Highest education level: Not on file  Occupational History  . Not on file  Tobacco Use  . Smoking status: Never Smoker  . Smokeless tobacco: Never Used  Substance and Sexual Activity  . Alcohol use: Yes    Comment: occasion  . Drug use: Never  . Sexual activity: Not on file  Other Topics Concern  . Not on file  Social History Narrative  . Not on file   Social Determinants of Health   Financial Resource Strain:   . Difficulty of Paying Living Expenses: Not on file  Food Insecurity:   . Worried About Charity fundraiser in the Last Year: Not on file  . Ran Out of Food in the Last  Year: Not on file  Transportation Needs:   . Lack of Transportation (Medical): Not on file  . Lack of Transportation (Non-Medical): Not on file  Physical Activity:   . Days of Exercise per Week: Not on file  . Minutes of Exercise per Session: Not on file  Stress:   . Feeling of Stress : Not on file  Social Connections:   . Frequency of Communication with Friends and Family: Not on file  . Frequency of Social Gatherings with Friends and Family: Not on file  . Attends Religious Services: Not on file  . Active Member of Clubs or Organizations: Not on file  . Attends Archivist Meetings: Not on file  . Marital Status: Not on file  Intimate Partner Violence:   . Fear of Current or Ex-Partner: Not on file  . Emotionally Abused:  Not on file  . Physically Abused: Not on file  . Sexually Abused: Not on file    Review of Systems  Constitutional: Negative for chills, fatigue, fever and unexpected weight change.  Respiratory: Negative for cough, chest tightness, shortness of breath and wheezing.   Cardiovascular: Negative for chest pain and leg swelling.  Gastrointestinal: Negative for abdominal distention, constipation, diarrhea, nausea and vomiting.  Neurological: Negative for dizziness, weakness, light-headedness and headaches.  Hematological: Does not bruise/bleed easily.       Objective:    BP 130/87   Pulse 68   Temp 98.2 F (36.8 C)   Ht 5' 6"  (1.676 m)   Wt 134 lb (60.8 kg)   SpO2 98%   BMI 21.63 kg/m  Nursing note and vital signs reviewed.  Physical Exam Constitutional:      General: She is not in acute distress.    Appearance: She is well-developed.  Cardiovascular:     Rate and Rhythm: Normal rate and regular rhythm.     Heart sounds: Normal heart sounds. No murmur. No friction rub. No gallop.   Pulmonary:     Effort: Pulmonary effort is normal. No respiratory distress.     Breath sounds: Normal breath sounds. No wheezing or rales.  Chest:     Chest wall: No tenderness.  Abdominal:     General: Bowel sounds are normal. There is no distension.     Palpations: Abdomen is soft. There is no mass.     Tenderness: There is no abdominal tenderness. There is no guarding or rebound.  Skin:    General: Skin is warm and dry.  Neurological:     Mental Status: She is alert and oriented to person, place, and time.  Psychiatric:        Behavior: Behavior normal.        Thought Content: Thought content normal.        Judgment: Judgment normal.         Assessment & Plan:   Patient Active Problem List   Diagnosis Date Noted  . Chronic hepatitis C without hepatic coma ([place]) 11/01/2019     Problem List Items Addressed This Visit      Digestive   Chronic hepatitis C without hepatic coma  ([place]) - Primary    Kelly Singh is a 64 y/o female with chronic hepatitis C without hepatic coma with initial viral load of 2 million. She is treatment naive and is currently asymptomatic with no previous personal or family history of liver disease. Largest risk factor appears to be age although does have tattoos and has shared razors in the past. Discussed  the pathophysiology, transmission, prevention, risks if left untreated and treatment options for Hepatitis C. We discussed the plan of care with time for questions. Will check blood work today. She met with our pharmacy staff for medication assistance. Will plan for treatment with Epclusa or Mavyret. Plan for follow up in 1 month after starting medication.       Relevant Orders   Hepatitis B surface antibody,qualitative   Hepatitis B surface antigen   Liver Fibrosis, FibroTest-ActiTest   Hepatitis C genotype   CBC       I am having [name] "[name]" maintain her sertraline, HYDROcodone-acetaminophen, vitamin E, multivitamin with minerals, Apoaequorin (PREVAGEN PO), and baclofen.   Follow-up: Return in about 1 month (around 12/02/2019), or if symptoms worsen or fail to improve.    [name], MSN, FNP-C Nurse Practitioner [facility] [place] number: [phone]

## 2019-11-01 NOTE — Patient Instructions (Signed)
Nice to meet you!  We will check your blood work today and call you when the results are available.   We will plan to follow up 1 month after you start medication.  Limit acetaminophen (Tylenol) usage to no more than 2 grams (2,000 mg) per day.  Avoid alcohol.  Do not share toothbrushes or razors.  Practice safe sex to protect against transmission as well as sexually transmitted disease.    Hepatitis C Hepatitis C is a viral infection of the liver. It can lead to scarring of the liver (cirrhosis), liver failure, or liver cancer. Hepatitis C may go undetected for months or years because people with the infection may not have symptoms, or they may have only mild symptoms. What are the causes? This condition is caused by the hepatitis C virus (HCV). The virus can spread from person to person (is contagious) through:  Blood.  Childbirth. A woman who has hepatitis C can pass it to her baby during birth.  Bodily fluids, such as breast milk, tears, semen, vaginal fluids, and saliva.  Blood transfusions or organ transplants done in the [place] before 1992.  What increases the risk? The following factors may make you more likely to develop this condition:  Having contact with unclean (contaminated) needles or syringes. This may result from: ? Acupuncture. ? Tattoing. ? Body piercing. ? Injecting drugs.  Having unprotected sex with someone who is infected.  Needing treatment to filter your blood (kidney dialysis).  Having HIV (human immunodeficiency virus) or AIDS (acquired immunodeficiency syndrome).  Working in a job that involves contact with blood or bodily fluids, such as health care.  What are the signs or symptoms? Symptoms of this condition include:  Fatigue.  Loss of appetite.  Nausea.  Vomiting.  Abdominal pain.  Dark yellow urine.  Yellowish skin and eyes (jaundice).  Itchy skin.  Clay-colored bowel movements.  Joint pain.  Bleeding and  bruising easily.  Fluid building up in your stomach (ascites).  In some cases, you may not have any symptoms. How is this diagnosed? This condition is diagnosed with:  Blood tests.  Other tests to check how well your liver is functioning. They may include: ? Magnetic resonance elastography (MRE). This imaging test uses MRIs and sound waves to measure liver stiffness. ? Transient elastography. This imaging test uses ultrasounds to measure liver stiffness. ? Liver biopsy. This test requires taking a small tissue sample from your liver to examine it under a microscope.  How is this treated? Your health care provider may perform noninvasive tests or a liver biopsy to help decide the best course of treatment. Treatment may include:  Antiviral medicines and other medicines.  Follow-up treatments every 6-12 months for infections or other liver conditions.  Receiving a donated liver (liver transplant).  Follow these instructions at home: Medicines  Take over-the-counter and prescription medicines only as told by your health care provider.  Take your antiviral medicine as told by your health care provider. Do not stop taking the antiviral even if you start to feel better.  Do not take any medicines unless approved by your health care provider, including over-the-counter medicines and birth control pills. Activity  Rest as needed.  Do not have sex unless approved by your health care provider.  Ask your health care provider when you may return to school or work. Eating and drinking  Eat a balanced diet with plenty of fruits and vegetables, whole grains, and lowfat (lean) meats or non-meat proteins (such as  beans or tofu).  Drink enough fluids to keep your urine clear or pale yellow.  Do not drink alcohol. General instructions  Do not share toothbrushes, nail clippers, or razors.  Wash your hands frequently with soap and water. If soap and water are not available, use hand  sanitizer.  Cover any cuts or open sores on your skin to prevent spreading the virus.  Keep all follow-up visits as told by your health care provider. This is important. You may need follow-up visits every 6-12 months. How is this prevented? There is no vaccine for hepatitis C. The only way to prevent the disease is to reduce the risk of exposure to the virus. Make sure you:  Wash your hands frequently with soap and water. If soap and water are not available, use hand sanitizer.  Do not share needles or syringes.  Practice safe sex and use condoms.  Avoid handling blood or bodily fluids without gloves or other protection.  Avoid getting tattoos or piercings in shops or other locations that are not clean.  Contact a health care provider if:  You have a fever.  You develop abdominal pain.  You pass dark urine.  You pass clay-colored stools.  You develop joint pain. Get help right away if:  You have increasing fatigue or weakness.  You lose your appetite.  You cannot eat or drink without vomiting.  You develop jaundice or your jaundice gets worse.  You bruise or bleed easily. Summary  Hepatitis C is a viral infection of the liver. It can lead to scarring of the liver (cirrhosis), liver failure, or liver cancer.  The hepatitis C virus (HCV) causes this condition. The virus can pass from person to person (is contagious).  You should not take any medicines unless approved by your health care provider. This includes over-the-counter medicines and birth control pills. This information is not intended to replace advice given to you by your health care provider. Make sure you discuss any questions you have with your health care provider. Document Released: 09/19/2000 Document Revised: 10/28/2016 Document Reviewed: 10/28/2016 Elsevier Interactive Patient Education  [employer].

## 2019-11-01 NOTE — Telephone Encounter (Signed)
[facility] Patient Advocate Encounter    Findings of the benefits investigation:   Insurance: AARP- active medicare  Prior Authorization: will begin insurance process once medication is prescribed  I obtained her income information and household size so we can apply with [employer] to get her copay amount covered. She is household size 2 (her and 64 year old granddaughter) and income from ssi is $1100 per month. She would like to pick up the medication in person at [facility].   Bartholomew Crews, CPhT Specialty Pharmacy Patient [facility] for Infectious Disease Phone: [phone] Fax: [phone] 11/01/2019 10:00 AM

## 2019-11-03 ENCOUNTER — Other Ambulatory Visit: Payer: Self-pay

## 2019-11-03 ENCOUNTER — Encounter: Payer: Self-pay | Admitting: Neurology

## 2019-11-03 ENCOUNTER — Ambulatory Visit: Payer: Medicare Other | Admitting: Neurology

## 2019-11-03 VITALS — BP 132/74 | HR 70 | Temp 97.8°F | Ht 66.0 in | Wt 135.5 lb

## 2019-11-03 DIAGNOSIS — G3281 Cerebellar ataxia in diseases classified elsewhere: Secondary | ICD-10-CM | POA: Diagnosis not present

## 2019-11-03 DIAGNOSIS — R269 Unspecified abnormalities of gait and mobility: Secondary | ICD-10-CM | POA: Diagnosis not present

## 2019-11-03 NOTE — Progress Notes (Signed)
PATIENT: Kelly Singh DOB: Oct 05, 1956  Chief Complaint  Patient presents with  . Cerebellar Ataxia    Kelly Singh previously saw Dr. Charise Carwin at [facility]. Kelly Singh is here to establish new neurology care. Reports difficulty with speech, voice, gait and neuropathy. Kelly Singh uses a walking stick to assist with ambulation.  Marland Kitchen PCP    Rocco Serene, MD     HISTORICAL  Kelly Singh is a 64 year old female, seen in request by her primary care physician Dr. Arline Asp, Mitzi Hansen for evaluation of speech difficulty, gait abnormality.  Initial evaluation was on November 03, 2019.  I have reviewed and summarized the referring note from the referring physician.  Kelly Singh has past medical history of depression, migraine headache, chronic low back pain  Around 2001, Kelly Singh first noticed problem with walking, Kelly Singh did not previously have any balance problem, was still riding a motorcycle, then began to notice unsteady gait, getting off for path while walking, then began to stumble, and fall, Kelly Singh often tripped on obstacles, in 2000 01, Kelly Singh had a fall, resulting ankle fracture, her balance then progressively worsened, then noticed difficulty even standing still,  Since 2018, Kelly Singh noticed mild slurred speech, denies trouble swallowing, mild trouble with hand coordination, Kelly Singh denies tremor  Kelly Singh previously lived in [place], had extensive evaluation at that time, then moved to [place] for couple years, was evaluated by movement disorder center Dr, Thermon Leyland on November 02, 2017, Kelly Singh was giving the 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, and slow progression of motor symptoms, Kelly Singh was giving a trial of carbidopa-levodopa 25/100 mg to 1 tablet 3 times a day, Kelly Singh developed nausea, there was no significant improvement, Kelly Singh was later referred to physical therapy,  Kelly Singh moved to [facility] since October 2020, is referred here to establish neurological care,  Kelly Singh denied a family history of gait problem,   Laboratory evaluations in January 2021 showed mild elevated alkaline phosphate 146, total bilirubin of 2.1, albumin was decreased 3.3, mild elevated AST 68, ALT 63, calcium was decreased 8.0  Nonreactive hepatitis B surface antibody, surface antigen, negative hepatitis C, elevated kappa free light chain 29.7, lambda light chain ratio 1.87   REVIEW OF SYSTEMS: Full 14 system review of systems performed and notable only for as above All other review of systems were negative.  ALLERGIES: Allergies  Allergen Reactions  . Adhesive [Tape] Other (See Comments)    Plastic tape eats through skin - pls use paper tape  . Latex Other (See Comments)    Eats through skin    HOME MEDICATIONS: Current Outpatient Medications  Medication Sig Dispense Refill  . Apoaequorin (PREVAGEN PO) Take 1 tablet by mouth daily.    . baclofen (LIORESAL) 20 MG tablet Take 20 mg by mouth 3 (three) times daily.    Marland Kitchen HYDROcodone-acetaminophen (NORCO) 10-325 MG tablet Take 1 tablet by mouth every 4 (four) hours as needed (pain).    . Multiple Vitamin (MULTIVITAMIN WITH MINERALS) TABS tablet Take 1 tablet by mouth daily.    . sertraline (ZOLOFT) 50 MG tablet Take 50 mg by mouth every morning.    . Vitamin D, Cholecalciferol, 25 MCG (1000 UT) TABS Take by mouth.    . vitamin E 1000 UNIT capsule Take 1,000 Units by mouth daily.     No current facility-administered medications for this visit.    PAST MEDICAL HISTORY: Past Medical History:  Diagnosis Date  . Ataxia   . Depression with anxiety   . Fibromyalgia   .  Migraine     PAST SURGICAL HISTORY: Past Surgical History:  Procedure Laterality Date  . ANKLE SURGERY Right 2011  . BREAST ENHANCEMENT SURGERY  1996  . TUBAL LIGATION  1979  . WRIST SURGERY Right 2016    FAMILY HISTORY: Family History  Problem Relation Age of Onset  . Bone cancer Mother   . Esophageal cancer Father     SOCIAL HISTORY: Social History   Socioeconomic History  . Marital  status: Single    Spouse name: Not on file  . Number of children: 1  . Years of education: college  . Highest education level: Associate degree: occupational, Hotel manager, or vocational program  Occupational History  . Occupation: prep food  Tobacco Use  . Smoking status: Former Smoker    Quit date: 1998    Years since quitting: 23.0  . Smokeless tobacco: Never Used  Substance and Sexual Activity  . Alcohol use: Yes    Comment: occasional  . Drug use: Never  . Sexual activity: Not on file  Other Topics Concern  . Not on file  Social History Narrative   Lives at home with her granddaughter.   Left-handed.   Caffeine use:c 1-2 cups per day.   Social Determinants of Health   Financial Resource Strain:   . Difficulty of Paying Living Expenses: Not on file  Food Insecurity:   . Worried About Charity fundraiser in the Last Year: Not on file  . Ran Out of Food in the Last Year: Not on file  Transportation Needs:   . Lack of Transportation (Medical): Not on file  . Lack of Transportation (Non-Medical): Not on file  Physical Activity:   . Days of Exercise per Week: Not on file  . Minutes of Exercise per Session: Not on file  Stress:   . Feeling of Stress : Not on file  Social Connections:   . Frequency of Communication with Friends and Family: Not on file  . Frequency of Social Gatherings with Friends and Family: Not on file  . Attends Religious Services: Not on file  . Active Member of Clubs or Organizations: Not on file  . Attends Archivist Meetings: Not on file  . Marital Status: Not on file  Intimate Partner Violence:   . Fear of Current or Ex-Partner: Not on file  . Emotionally Abused: Not on file  . Physically Abused: Not on file  . Sexually Abused: Not on file     PHYSICAL EXAM   Vitals:   11/03/19 1116  BP: 132/74  Pulse: 70  Temp: 97.8 F (36.6 C)  Weight: 135 lb 8 oz (61.5 kg)  Height: 5\' 6"  (1.676 m)    Not recorded      Body mass index  is 21.87 kg/m.  PHYSICAL EXAMNIATION:  Gen: NAD, conversant, well nourised, well groomed                     Cardiovascular: Regular rate rhythm, no peripheral edema, warm, nontender. Eyes: Conjunctivae clear without exudates or hemorrhage Neck: Supple, no carotid bruits. Pulmonary: Clear to auscultation bilaterally   NEUROLOGICAL EXAM:  MENTAL STATUS: Speech:    Mildly slurred scanned speech; fluent and spontaneous with normal comprehension.  Cognition:     Orientation to time, place and person     Normal recent and remote memory     Normal Attention span and concentration     Normal Language, naming, repeating,spontaneous speech     [employer]  of knowledge   CRANIAL NERVES: CN II: Visual fields are full to confrontation. Pupils are round equal and briskly reactive to light. CN III, IV, VI: extraocular movement are normal. No ptosis. CN V: Facial sensation is intact to light touch CN VII: Face is symmetric with normal eye closure  CN VIII: Hearing is normal to causal conversation. CN IX, X: Phonation is normal. CN XI: Head turning and shoulder shrug are intact  MOTOR: There is no pronator drift of out-stretched arms. Muscle bulk and tone are normal. Muscle strength is normal.  REFLEXES: Reflexes are 2+ and symmetric at the biceps, triceps, knees, and ankles. Plantar responses are flexor.  SENSORY: Intact to light touch, pinprick and vibratory sensation are intact in fingers and toes.  COORDINATION: Kelly Singh has mild heel-to-shin dysmetria, mild truncal ataxia, no significant finger-to-nose dysmetria noted,  GAIT/STANCE: Kelly Singh needs push-up to get up from seated position, mildly truncal ataxia, wide-based, unsteady gait,   DIAGNOSTIC DATA (LABS, IMAGING, TESTING) - I reviewed patient records, labs, notes, testing and imaging myself where available.   ASSESSMENT AND PLAN  Silver Willems is a 64 y.o. 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 Ph.D.  [facility] [address] [place], [place] [address] Ph: [phone] Fax: [phone]  CC: Rocco Serene, MD

## 2019-11-04 LAB — HEPATITIS B SURFACE ANTIBODY,QUALITATIVE: Hep B S Ab: NONREACTIVE

## 2019-11-04 LAB — LIVER FIBROSIS, FIBROTEST-ACTITEST
ALT: 63 U/L — ABNORMAL HIGH (ref 6–29)
Alpha-2-Macroglobulin: 234 mg/dL (ref 106–279)
Apolipoprotein A1: 121 mg/dL (ref 101–198)
Bilirubin: 0.9 mg/dL (ref 0.2–1.2)
Fibrosis Score: 0.8
GGT: 83 U/L — ABNORMAL HIGH (ref 3–65)
Haptoglobin: 22 mg/dL — ABNORMAL LOW (ref 43–212)
Necroinflammat ACT Score: 0.56
Reference ID: 3247032

## 2019-11-04 LAB — CBC
HCT: 41.4 % (ref 35.0–45.0)
Hemoglobin: 14.2 g/dL (ref 11.7–15.5)
MCH: 32.6 pg (ref 27.0–33.0)
MCHC: 34.3 g/dL (ref 32.0–36.0)
MCV: 95 fL (ref 80.0–100.0)
MPV: 12.9 fL — ABNORMAL HIGH (ref 7.5–12.5)
Platelets: 94 10*3/uL — ABNORMAL LOW (ref 140–400)
RBC: 4.36 10*6/uL (ref 3.80–5.10)
RDW: 13 % (ref 11.0–15.0)
WBC: 2.5 10*3/uL — ABNORMAL LOW (ref 3.8–10.8)

## 2019-11-04 LAB — ANA W/REFLEX IF POSITIVE
Anti JO-1: <0.2 AI (ref 0.0–0.9)
Anti Nuclear Antibody (ANA): POSITIVE — AB
Centromere Ab Screen: <0.2 AI (ref 0.0–0.9)
Chromatin Ab SerPl-aCnc: 0.2 AI (ref 0.0–0.9)
ENA RNP Ab: <0.2 AI (ref 0.0–0.9)
ENA SM Ab Ser-aCnc: <0.2 AI (ref 0.0–0.9)
ENA SSA (RO) Ab: <0.2 AI (ref 0.0–0.9)
ENA SSB (LA) Ab: <0.2 AI (ref 0.0–0.9)
Scleroderma (Scl-70) (ENA) Antibody, IgG: <0.2 AI (ref 0.0–0.9)
dsDNA Ab: 12 IU/mL — ABNORMAL HIGH (ref 0–9)

## 2019-11-04 LAB — HIV ANTIBODY (ROUTINE TESTING W REFLEX): HIV Screen 4th Generation wRfx: NONREACTIVE

## 2019-11-04 LAB — HEPATITIS C GENOTYPE

## 2019-11-04 LAB — THYROID PANEL WITH TSH
Free Thyroxine Index: 1.3 (ref 1.2–4.9)
T3 Uptake Ratio: 21 % — ABNORMAL LOW (ref 24–39)
T4, Total: 6 ug/dL (ref 4.5–12.0)
TSH: 2.3 u[IU]/mL (ref 0.450–4.500)

## 2019-11-04 LAB — SEDIMENTATION RATE: Sed Rate: 2 mm/hr (ref 0–40)

## 2019-11-04 LAB — FOLATE: Folate: >20 ng/mL (ref >3.0)

## 2019-11-04 LAB — VITAMIN D 25 HYDROXY (VIT D DEFICIENCY, FRACTURES): Vit D, 25-Hydroxy: 48.6 ng/mL (ref 30.0–100.0)

## 2019-11-04 LAB — CK: Total CK: 96 U/L (ref 32–182)

## 2019-11-04 LAB — RPR: RPR Ser Ql: NONREACTIVE

## 2019-11-04 LAB — HEPATITIS B SURFACE ANTIGEN: Hepatitis B Surface Ag: NONREACTIVE

## 2019-11-04 LAB — C-REACTIVE PROTEIN: CRP: <1 mg/L (ref 0–10)

## 2019-11-04 LAB — VITAMIN B12: Vitamin B-12: 896 pg/mL (ref 232–1245)

## 2019-11-07 ENCOUNTER — Telehealth: Payer: Self-pay | Admitting: Neurology

## 2019-11-07 NOTE — Telephone Encounter (Signed)
I was able to speak to the patient and she verbalized understanding of the results and the follow up plan.

## 2019-11-07 NOTE — Telephone Encounter (Signed)
Left message requesting a return call.

## 2019-11-07 NOTE — Telephone Encounter (Signed)
Please call patient, laboratory evaluations showed positive ANA, with mild elevated double-stranded DNA antibody, which has unknown clinical significance, above findings would not explain her progressive gait abnormality,  There are also continued evidence of decreased WBC, with decreased platelet, she should continue to work with her primary care/hematologist,  Rest of the laboratory evaluation showed no significant abnormalities

## 2019-11-08 ENCOUNTER — Other Ambulatory Visit: Payer: Self-pay

## 2019-11-08 ENCOUNTER — Observation Stay (HOSPITAL_COMMUNITY)
Admission: EM | Admit: 2019-11-08 | Discharge: 2019-11-09 | Disposition: A | Discharge status: Home / Self Care | Payer: Medicare Other | Attending: Family Medicine | Admitting: Family Medicine

## 2019-11-08 ENCOUNTER — Emergency Department (HOSPITAL_COMMUNITY): Payer: Medicare Other

## 2019-11-08 DIAGNOSIS — Z87891 Personal history of nicotine dependence: Secondary | ICD-10-CM | POA: Diagnosis not present

## 2019-11-08 DIAGNOSIS — Z9181 History of falling: Secondary | ICD-10-CM | POA: Diagnosis not present

## 2019-11-08 DIAGNOSIS — Z20822 Contact with and (suspected) exposure to covid-19: Secondary | ICD-10-CM | POA: Diagnosis not present

## 2019-11-08 DIAGNOSIS — M797 Fibromyalgia: Secondary | ICD-10-CM | POA: Insufficient documentation

## 2019-11-08 DIAGNOSIS — R2681 Unsteadiness on feet: Secondary | ICD-10-CM | POA: Insufficient documentation

## 2019-11-08 DIAGNOSIS — E876 Hypokalemia: Secondary | ICD-10-CM | POA: Diagnosis present

## 2019-11-08 DIAGNOSIS — Z79899 Other long term (current) drug therapy: Secondary | ICD-10-CM | POA: Insufficient documentation

## 2019-11-08 DIAGNOSIS — F10129 Alcohol abuse with intoxication, unspecified: Secondary | ICD-10-CM | POA: Insufficient documentation

## 2019-11-08 DIAGNOSIS — Z91040 Latex allergy status: Secondary | ICD-10-CM | POA: Diagnosis not present

## 2019-11-08 DIAGNOSIS — Z888 Allergy status to other drugs, medicaments and biological substances status: Secondary | ICD-10-CM | POA: Insufficient documentation

## 2019-11-08 DIAGNOSIS — R2689 Other abnormalities of gait and mobility: Secondary | ICD-10-CM | POA: Diagnosis not present

## 2019-11-08 DIAGNOSIS — F419 Anxiety disorder, unspecified: Secondary | ICD-10-CM | POA: Diagnosis not present

## 2019-11-08 DIAGNOSIS — F10929 Alcohol use, unspecified with intoxication, unspecified: Secondary | ICD-10-CM

## 2019-11-08 DIAGNOSIS — R42 Dizziness and giddiness: Secondary | ICD-10-CM | POA: Diagnosis present

## 2019-11-08 DIAGNOSIS — F329 Major depressive disorder, single episode, unspecified: Secondary | ICD-10-CM | POA: Diagnosis not present

## 2019-11-08 DIAGNOSIS — W06XXXA Fall from bed, initial encounter: Secondary | ICD-10-CM | POA: Insufficient documentation

## 2019-11-08 DIAGNOSIS — S0101XA Laceration without foreign body of scalp, initial encounter: Secondary | ICD-10-CM

## 2019-11-08 DIAGNOSIS — R269 Unspecified abnormalities of gait and mobility: Secondary | ICD-10-CM

## 2019-11-08 DIAGNOSIS — W19XXXA Unspecified fall, initial encounter: Secondary | ICD-10-CM | POA: Diagnosis present

## 2019-11-08 DIAGNOSIS — Z23 Encounter for immunization: Secondary | ICD-10-CM | POA: Insufficient documentation

## 2019-11-08 DIAGNOSIS — B182 Chronic viral hepatitis C: Secondary | ICD-10-CM | POA: Diagnosis not present

## 2019-11-08 DIAGNOSIS — S0990XA Unspecified injury of head, initial encounter: Secondary | ICD-10-CM

## 2019-11-08 LAB — CBC WITH DIFFERENTIAL/PLATELET
Abs Immature Granulocytes: 0.01 10*3/uL (ref 0.00–0.07)
Basophils Absolute: 0.1 10*3/uL (ref 0.0–0.1)
Basophils Relative: 1 %
Eosinophils Absolute: 0.1 10*3/uL (ref 0.0–0.5)
Eosinophils Relative: 2 %
HCT: 34.6 % — ABNORMAL LOW (ref 36.0–46.0)
Hemoglobin: 11.3 g/dL — ABNORMAL LOW (ref 12.0–15.0)
Immature Granulocytes: 0 %
Lymphocytes Relative: 37 %
Lymphs Abs: 3 10*3/uL (ref 0.7–4.0)
MCH: 32.8 pg (ref 26.0–34.0)
MCHC: 32.7 g/dL (ref 30.0–36.0)
MCV: 100.3 fL — ABNORMAL HIGH (ref 80.0–100.0)
Monocytes Absolute: 0.5 10*3/uL (ref 0.1–1.0)
Monocytes Relative: 6 %
Neutro Abs: 4.4 10*3/uL (ref 1.7–7.7)
Neutrophils Relative %: 54 %
Platelets: 139 10*3/uL — ABNORMAL LOW (ref 150–400)
RBC: 3.45 MIL/uL — ABNORMAL LOW (ref 3.87–5.11)
RDW: 13.8 % (ref 11.5–15.5)
WBC: 8 10*3/uL (ref 4.0–10.5)
nRBC: 0 % (ref 0.0–0.2)

## 2019-11-08 LAB — BASIC METABOLIC PANEL
Anion gap: 10 (ref 5–15)
BUN: 8 mg/dL (ref 8–23)
CO2: 23 mmol/L (ref 22–32)
Calcium: 7.6 mg/dL — ABNORMAL LOW (ref 8.9–10.3)
Chloride: 107 mmol/L (ref 98–111)
Creatinine, Ser: 0.81 mg/dL (ref 0.44–1.00)
GFR calc Af Amer: >60 mL/min (ref >60)
GFR calc non Af Amer: >60 mL/min (ref >60)
Glucose, Bld: 164 mg/dL — ABNORMAL HIGH (ref 70–99)
Potassium: 2.7 mmol/L — CL (ref 3.5–5.1)
Sodium: 140 mmol/L (ref 135–145)

## 2019-11-08 MED ORDER — POTASSIUM CHLORIDE 10 MEQ/100ML IV SOLN
10.0000 meq | Freq: Once | INTRAVENOUS | Status: DC
  Filled 2019-11-08: qty 100

## 2019-11-08 MED ORDER — LIDOCAINE-EPINEPHRINE (PF) 2 %-1:200000 IJ SOLN
20.0000 mL | Freq: Once | INTRAMUSCULAR | Status: AC
  Administered 2019-11-08: 20 mL
  Filled 2019-11-08: qty 20

## 2019-11-08 MED ORDER — ONDANSETRON HCL 4 MG/2ML IJ SOLN
4.0000 mg | Freq: Once | INTRAMUSCULAR | Status: AC
  Administered 2019-11-08: 4 mg via INTRAVENOUS
  Filled 2019-11-08: qty 2

## 2019-11-08 MED ORDER — POTASSIUM CHLORIDE CRYS ER 20 MEQ PO TBCR
40.0000 meq | EXTENDED_RELEASE_TABLET | Freq: Once | ORAL | Status: AC
  Administered 2019-11-08: 40 meq via ORAL
  Filled 2019-11-08: qty 2

## 2019-11-08 MED ORDER — SODIUM CHLORIDE 0.9 % IV BOLUS
1000.0000 mL | Freq: Once | INTRAVENOUS | Status: AC
  Administered 2019-11-08: 1000 mL via INTRAVENOUS

## 2019-11-08 MED ORDER — POTASSIUM CHLORIDE 10 MEQ/100ML IV SOLN
10.0000 meq | INTRAVENOUS | Status: AC
  Administered 2019-11-08 – 2019-11-09 (×2): 10 meq via INTRAVENOUS

## 2019-11-08 NOTE — ED Notes (Signed)
Kelly Singh  [phone] daughter

## 2019-11-08 NOTE — ED Notes (Signed)
Suture Cart at bedside.  

## 2019-11-08 NOTE — ED Triage Notes (Signed)
Pt bib ems has a hxt of  Spinal ataxia. Slurred speech is her baseline. Pt has been falling more lately. Pt fell and hit head on a dresser drawer knob. Large head lac on the top left side of the head. Pt aox4. No blood thinners.  90/48 98hr 98%ra 118cbg

## 2019-11-08 NOTE — ED Provider Notes (Signed)
Kelly Singh   CSN: [id number] Arrival date & time: 11/08/19  2118     History Chief Complaint  Patient presents with  . Head Injury    Kelly Singh is a 64 y.o. female.  HPI      Kelly Singh is a 64 y.o. female, with a history of ataxia,, presenting to the ED with head injury that occurred today around 6:30 PM. Patient states she tripped getting out of bed, fell, and hit her head on a dresser drawer handle. Complains of nausea. Admits to alcohol use tonight, but will not detail how much. Denies anticoagulation. Denies LOC, neck/back pain, chest pain, abdominal pain, vomiting, shortness of breath, recent illness, extremity injuries, or any other injuries or complaints.    Past Medical History:  Diagnosis Date  . Ataxia   . Depression with anxiety   . Fibromyalgia   . Migraine     Patient Active Problem List   Diagnosis Date Noted  . Gait abnormality 11/03/2019  . Chronic hepatitis C without hepatic coma ([place]) 11/01/2019    Past Surgical History:  Procedure Laterality Date  . ANKLE SURGERY Right 2011  . BREAST ENHANCEMENT SURGERY  1996  . TUBAL LIGATION  1979  . WRIST SURGERY Right 2016     OB History   No obstetric history on file.     Family History  Problem Relation Age of Onset  . Bone cancer Mother   . Esophageal cancer Father     Social History   Tobacco Use  . Smoking status: Former Smoker    Quit date: 1998    Years since quitting: 23.1  . Smokeless tobacco: Never Used  Substance Use Topics  . Alcohol use: Yes    Comment: occasional  . Drug use: Never    Home Medications Prior to Admission medications   Medication Sig Start Date End Date Taking? Authorizing Provider  Apoaequorin (PREVAGEN PO) Take 1 tablet by mouth daily.    [provider]  baclofen (LIORESAL) 20 MG tablet Take 20 mg by mouth 3 (three) times daily.    [provider]    HYDROcodone-acetaminophen (NORCO) 10-325 MG tablet Take 1 tablet by mouth every 4 (four) hours as needed (pain).    [provider]  Multiple Vitamin (MULTIVITAMIN WITH MINERALS) TABS tablet Take 1 tablet by mouth daily.    [provider]  sertraline (ZOLOFT) 50 MG tablet Take 50 mg by mouth every morning.    [provider]  Vitamin D, Cholecalciferol, 25 MCG (1000 UT) TABS Take by mouth.    [provider]  vitamin E 1000 UNIT capsule Take 1,000 Units by mouth daily.    [provider]    Allergies    Adhesive [tape] and Latex  Review of Systems   Review of Systems  Constitutional: Negative for chills, diaphoresis and fever.  HENT:       Scalp laceration  Respiratory: Negative for cough and shortness of breath.   Cardiovascular: Negative for chest pain.  Gastrointestinal: Positive for nausea. Negative for abdominal pain and vomiting.  Neurological: Negative for dizziness, syncope, weakness, numbness and headaches.  All other systems reviewed and are negative.   Physical Exam Updated Vital Signs BP 118/83   Pulse (!) 104   Temp 97.7 F (36.5 C) (Oral)   Resp 16   Ht 5\' 6"  (1.676 m)   Wt 61.2 kg   SpO2 99%   BMI 21.79 kg/m  Physical Exam Vitals and nursing Singh reviewed.  Constitutional:      General: She is not in acute distress.    Appearance: She is well-developed. She is not diaphoretic.  HENT:     Head: Normocephalic.     Comments: Approximately 14 cm laceration to the left temporal and parietal scalp.  Steady, oozing hemorrhage.  Large amount of clot within the wound. No noted deformity or instability.  No other noted wounds to the scalp or face.    Mouth/Throat:     Mouth: Mucous membranes are moist.     Pharynx: Oropharynx is clear.  Eyes:     Conjunctiva/sclera: Conjunctivae normal.  Cardiovascular:     Rate and Rhythm: Normal rate and regular rhythm.     Pulses: Normal pulses.          Radial pulses are 2+  on the right side and 2+ on the left side.       Posterior tibial pulses are 2+ on the right side and 2+ on the left side.     Heart sounds: Normal heart sounds.     Comments: Tactile temperature in the extremities appropriate and equal bilaterally. Pulmonary:     Effort: Pulmonary effort is normal. No respiratory distress.     Breath sounds: Normal breath sounds.  Abdominal:     Palpations: Abdomen is soft.     Tenderness: There is no abdominal tenderness. There is no guarding.  Musculoskeletal:     Cervical back: Neck supple.     Right lower leg: No edema.     Left lower leg: No edema.     Comments: Normal motor function intact in all extremities. No midline spinal tenderness.   Overall trauma exam performed without any abnormalities noted other than those mentioned.  Skin:    General: Skin is warm and dry.  Neurological:     Mental Status: She is alert and oriented to person, place, and time.     Comments: No noted acute cognitive deficit. Sensation grossly intact to light touch in the extremities.   Grip strengths equal bilaterally.   Strength 5/5 in all extremities.  Cranial nerves III-XII grossly intact.  Handles oral secretions without noted difficulty.  Some slurred speech noted.  Patient states this is baseline for her. No facial droop.   Patient was quite insistent about trying to stand up in order to get to the commode to urinate.  When she stood up and took a few steps, she was noted to have a wide gait, which is described as her baseline.  She was assisted by 2 persons. She then began to feel lightheaded, stop walking, and slowly had a syncopal episode.  She did not fall.  She was supported the entire time.  She was carried back to the bed and shortly later regained consciousness.  No seizure activity.  No vomiting.  No incontinence.  Psychiatric:        Mood and Affect: Mood and affect normal.        Speech: Speech normal.        Behavior: Behavior normal.     ED  Results / Procedures / Treatments   Labs (all labs ordered are listed, but only abnormal results are displayed) Labs Reviewed  BASIC METABOLIC PANEL - Abnormal; Notable for the following components:      Result Value   Potassium 2.7 (*)    Glucose, Bld 164 (*)    Calcium 7.6 (*)    All other  components within normal limits  CBC WITH DIFFERENTIAL/PLATELET - Abnormal; Notable for the following components:   RBC 3.45 (*)    Hemoglobin 11.3 (*)    HCT 34.6 (*)    MCV 100.3 (*)    Platelets 139 (*)    All other components within normal limits  ETHANOL   Hemoglobin  Date Value Ref Range Status  11/08/2019 11.3 (L) 12.0 - 15.0 g/dL Final  11/01/2019 14.2 11.7 - 15.5 g/dL Final  10/13/2019 14.7 12.0 - 15.0 g/dL Final    EKG None  Radiology No results found.  Procedures .Marland KitchenLaceration Repair  Date/Time: 11/08/2019 11:00 PM Performed by: Lorayne Bender, PA-C Authorized by: Lorayne Bender, PA-C   Consent:    Consent obtained:  Verbal   Consent given by:  Patient   Risks discussed:  Infection, pain, poor cosmetic result, poor wound healing and need for additional repair Anesthesia (see MAR for exact dosages):    Anesthesia method:  Local infiltration   Local anesthetic:  Lidocaine 2% WITH epi Laceration details:    Location:  Scalp   Scalp location:  L parietal   Length (cm):  14 Repair type:    Repair type:  Intermediate Pre-procedure details:    Preparation:  Patient was prepped and draped in usual sterile fashion and imaging obtained to evaluate for foreign bodies Exploration:    Hemostasis achieved with:  Epinephrine   Wound exploration: wound explored through full range of motion and entire depth of wound probed and visualized   Treatment:    Area cleansed with:  Betadine and saline   Amount of cleaning:  Extensive   Irrigation method:  Syringe Skin repair:    Repair method:  Staples   Number of staples:  13 Approximation:    Approximation:  Close Post-procedure  details:    Dressing:  Sterile dressing   Patient tolerance of procedure:  Tolerated well, no immediate complications   (including critical care time)  Medications Ordered in ED Medications  potassium chloride 10 mEq in 100 mL IVPB (10 mEq Intravenous New Bag/Given 11/08/19 2349)  Tdap (BOOSTRIX) injection 0.5 mL (has no administration in time range)  ondansetron (ZOFRAN) injection 4 mg (4 mg Intravenous Given 11/08/19 2342)  sodium chloride 0.9 % bolus 1,000 mL (0 mLs Intravenous Stopped 11/08/19 2324)  lidocaine-EPINEPHrine (XYLOCAINE W/EPI) 2 %-1:200000 (PF) injection 20 mL (20 mLs Infiltration Given 11/08/19 2344)  potassium chloride SA (KLOR-CON) CR tablet 40 mEq (40 mEq Oral Given 11/08/19 2353)  sodium chloride 0.9 % bolus 1,000 mL (1,000 mLs Intravenous New Bag/Given 11/08/19 2342)    ED Course  I have reviewed the triage vital signs and the nursing notes.  Pertinent labs & imaging results that were available during my care of the patient were reviewed by me and considered in my medical decision making (see chart for details).    MDM Rules/Calculators/A&P                      Patient presents after what she describes as a mechanical fall resulting in head injury. Chart review notes patient has a long history of ataxia, previously managed in [place], [place], and she has just established care with a neurologist here in [place].  She was noted to have drop in her hemoglobin from 14.2 a week ago to 11.3 today.  She denies any other bleeding, therefore, we must entertain the possibility that this hemoglobin drop may be from her head wound.  Further evidence  for this is her orthostatic syncopal episode here in the ED.  Hypokalemia was noted and addressed.  Findings and plan of care discussed with Trish Mage, MD. Dr. Stark Jock personally evaluated and examined this patient.  End of shift patient care handoff report given to Joline Maxcy, PA-C. Plan: Review CTs.  Consider retesting hemoglobin.  Ambulate patient.   Vitals:   11/08/19 2136 11/08/19 2245 11/08/19 2250 11/08/19 2300  BP:  95/61 98/62 (!) 108/58  Pulse: (!) 104 70 68 75  Resp:  18 17 (!) 21  Temp:      TempSrc:      SpO2: 99% 99% 99% 100%  Weight:      Height:         Final Clinical Impression(s) / ED Diagnoses Final diagnoses:  Injury of head, initial encounter  Fall, initial encounter  Laceration of scalp, initial encounter  Hypokalemia    Rx / DC Orders ED Discharge Orders    None       Layla Maw 11/09/19 0020    Veryl Speak, MD 11/09/19 1623

## 2019-11-08 NOTE — ED Provider Notes (Signed)
64 year old female received at signout from [facility] pending imaging, fluids, and reassessment. Per his HPI:   "Kelly Singh is a 64 y.o. female, with a history of ataxia,, presenting to the ED with head injury that occurred today around 6:30 PM. Patient states she tripped getting out of bed, fell, and hit her head on a dresser drawer handle. Complains of nausea. Admits to alcohol use tonight, but will not detail how much."  PMH includes MGUS, chronic hepatitis C, dysphonia, depression, migraines, chronic low back pain, and spinocerebellar ataxia.   The patient reports that she was wearing socks and slipped and fell.  Reports that she had one still a beer earlier today.  She denies any recent fever, chills, nausea, vomiting, diarrhea, abdominal pain.  Reports that multiple towels were soaked in blood at home after the fall.   Spoke at length with the patient's daughter [name].  She reports that the patient and herself are strange for many years.  The patient previously lived in [place] in [place].  She was followed by neurology in one of the 2 previous cities and was diagnosed with "spinal ataxia".  Amber reports that her mother was told that she would die from spinal ataxia in less than 2 years.  Amber reports that her mother sold all of her belongings with anticipation that she was going to die.  The patient recently relocated to [facility] in October to be closer to family.  Amber's daughter currently lives with the patient.   She reports that the patient seemed to only use to have a few falls every few months, but this has been increasing over the last few months.  She has had at least 5-10 falls this month.  Amber spoke with the patient's ex-husband earlier today who was on the phone with the patient when she had another fall earlier today.  She also recently started a new job 3 weeks ago and has had at least 3 falls at work since that time.  The patient's daughter does report she has  infrequent difficulty with word finding.  She has oriented x4 at baseline.  She does have a walking stick at home that she will sometimes use with ambulation.  The patient's daughter reports that she has been trying to get the patient established with primary care providers in [place], and the patient has been to multiple appointments over the last month.  Physical Exam  BP 99/64   Pulse 76   Temp 97.7 F (36.5 C) (Oral)   Resp 19   Ht 5\' 6"  (1.676 m)   Wt 61.2 kg   SpO2 98%   BMI 21.79 kg/m   Physical Exam Vitals and nursing note reviewed.  Constitutional:      General: She is not in acute distress. HENT:     Head:     Comments: Large amount of dried blood covering the patient's neck, face, and hair.  Large laceration closed with staples along the left side of the scalp.  Wound is hemostatic. Eyes:     Conjunctiva/sclera: Conjunctivae normal.  Cardiovascular:     Rate and Rhythm: Normal rate and regular rhythm.     Heart sounds: No murmur. No friction rub. No gallop.   Pulmonary:     Effort: Pulmonary effort is normal. No respiratory distress.  Abdominal:     General: There is no distension.     Palpations: Abdomen is soft. There is no mass.     Tenderness: There is no abdominal  tenderness. There is no right CVA tenderness, left CVA tenderness, guarding or rebound.     Hernia: No hernia is present.  Musculoskeletal:     Cervical back: Neck supple.     Right lower leg: No edema.     Left lower leg: No edema.  Skin:    General: Skin is warm.     Capillary Refill: Capillary refill takes less than 2 seconds.     Findings: No rash.  Neurological:     Mental Status: She is alert.     Comments: Alert and oriented to name, year, city, state, and place.  She intermittently has difficulty with word finding.  She does answer questions appropriately and follows simple commands.  She intermittently seems confused.  Broad-based gait with mild ataxia noted.  Good strength against  resistance of the upper and lower extremities.  Sensation is intact and equal throughout.  Cranial nerves II through XII are grossly intact.  Psychiatric:        Behavior: Behavior normal.     ED Course/Procedures     Procedures  MDM   64 year old female received at signout from [facility] pending imaging and reassessment.  CT head and cervical spine are unremarkable.  C-collar removed.  Patient had acute hypokalemia from labs several weeks ago, 2.7 today. Hemoglobin is 11.3, down from 14.2.  I suspect there is a large component of this from the patient's a fall tonight.  No other recent bleeding.  Scalp laceration was closed by PA Joy.  Please see his note for further work-up and medical decision making.  Following 2 L of fluids and potassium administration, I ambulated the patient with nursing staff.  She was able to ambulate with one-person assistance.  After approximately 15 to 20 feet, she endorsed feeling very lightheaded and wanted to sit down.  She was able to make it back to the bed.  Orthostatic vital signs were performed and blood pressure stayed stable, but heart rate increased from the 80s to 110s.   Given persistent lightheadedness after multi bolus fluid resuscitation, the hospitalist team was consulted for admission.  Dr. Shanon Brow has accepted the patient for admission.  On reevaluation, patient continues to be alert and oriented, but does still appear to have some confusion.  I spoke with the patient's daughter, and there does appear to be a slight change from her baseline reviewed the patient's chart and did see that her hepatic function panel test was elevated earlier this month.  She does have alcohol use and only reported drinking 1 beer today, but will check an ammonia level given mild confusion.  We will repeat H&H.  We will also order magnesium since patient was hypokalemic today.  The patient appears reasonably stabilized for admission considering the current resources, flow, and  capabilities available in the ED at this time, and I doubt any other [facility] requiring further screening and/or treatment in the ED prior to admission.     Joline Maxcy A, PA-C 11/09/19 [id number]    Ripley Fraise, MD 11/10/19 [phone]

## 2019-11-09 DIAGNOSIS — S0101XA Laceration without foreign body of scalp, initial encounter: Secondary | ICD-10-CM

## 2019-11-09 DIAGNOSIS — R42 Dizziness and giddiness: Secondary | ICD-10-CM | POA: Diagnosis not present

## 2019-11-09 DIAGNOSIS — F10929 Alcohol use, unspecified with intoxication, unspecified: Secondary | ICD-10-CM

## 2019-11-09 DIAGNOSIS — E876 Hypokalemia: Secondary | ICD-10-CM | POA: Diagnosis not present

## 2019-11-09 DIAGNOSIS — W19XXXA Unspecified fall, initial encounter: Secondary | ICD-10-CM | POA: Diagnosis present

## 2019-11-09 LAB — COMPREHENSIVE METABOLIC PANEL
ALT: 46 U/L — ABNORMAL HIGH (ref 0–44)
AST: 50 U/L — ABNORMAL HIGH (ref 15–41)
Albumin: 2.3 g/dL — ABNORMAL LOW (ref 3.5–5.0)
Alkaline Phosphatase: 82 U/L (ref 38–126)
Anion gap: 3 — ABNORMAL LOW (ref 5–15)
BUN: 8 mg/dL (ref 8–23)
CO2: 24 mmol/L (ref 22–32)
Calcium: 7.5 mg/dL — ABNORMAL LOW (ref 8.9–10.3)
Chloride: 110 mmol/L (ref 98–111)
Creatinine, Ser: 0.68 mg/dL (ref 0.44–1.00)
GFR calc Af Amer: >60 mL/min (ref >60)
GFR calc non Af Amer: >60 mL/min (ref >60)
Glucose, Bld: 114 mg/dL — ABNORMAL HIGH (ref 70–99)
Potassium: 4.2 mmol/L (ref 3.5–5.1)
Sodium: 137 mmol/L (ref 135–145)
Total Bilirubin: 1.5 mg/dL — ABNORMAL HIGH (ref 0.3–1.2)
Total Protein: 5.1 g/dL — ABNORMAL LOW (ref 6.5–8.1)

## 2019-11-09 LAB — CBC
HCT: 28.6 % — ABNORMAL LOW (ref 36.0–46.0)
Hemoglobin: 9 g/dL — ABNORMAL LOW (ref 12.0–15.0)
MCH: 32 pg (ref 26.0–34.0)
MCHC: 31.5 g/dL (ref 30.0–36.0)
MCV: 101.8 fL — ABNORMAL HIGH (ref 80.0–100.0)
Platelets: 114 10*3/uL — ABNORMAL LOW (ref 150–400)
RBC: 2.81 MIL/uL — ABNORMAL LOW (ref 3.87–5.11)
RDW: 14.1 % (ref 11.5–15.5)
WBC: 6.6 10*3/uL (ref 4.0–10.5)
nRBC: 0 % (ref 0.0–0.2)

## 2019-11-09 LAB — AMMONIA: Ammonia: 14 umol/L (ref 9–35)

## 2019-11-09 LAB — URINALYSIS, ROUTINE W REFLEX MICROSCOPIC
Bilirubin Urine: NEGATIVE
Glucose, UA: NEGATIVE mg/dL
Hgb urine dipstick: NEGATIVE
Ketones, ur: NEGATIVE mg/dL
Leukocytes,Ua: NEGATIVE
Nitrite: NEGATIVE
Protein, ur: NEGATIVE mg/dL
Specific Gravity, Urine: 1.014 (ref 1.005–1.030)
pH: 5 (ref 5.0–8.0)

## 2019-11-09 LAB — HEPATIC FUNCTION PANEL
ALT: 49 U/L — ABNORMAL HIGH (ref 0–44)
AST: 54 U/L — ABNORMAL HIGH (ref 15–41)
Albumin: 2.4 g/dL — ABNORMAL LOW (ref 3.5–5.0)
Alkaline Phosphatase: 87 U/L (ref 38–126)
Bilirubin, Direct: 0.5 mg/dL — ABNORMAL HIGH (ref 0.0–0.2)
Indirect Bilirubin: 0.8 mg/dL (ref 0.3–0.9)
Total Bilirubin: 1.3 mg/dL — ABNORMAL HIGH (ref 0.3–1.2)
Total Protein: 5.5 g/dL — ABNORMAL LOW (ref 6.5–8.1)

## 2019-11-09 LAB — SARS CORONAVIRUS 2 (TAT 6-24 HRS): SARS Coronavirus 2: NEGATIVE

## 2019-11-09 LAB — HEMOGLOBIN AND HEMATOCRIT, BLOOD
HCT: 26.7 % — ABNORMAL LOW (ref 36.0–46.0)
Hemoglobin: 8.7 g/dL — ABNORMAL LOW (ref 12.0–15.0)

## 2019-11-09 LAB — MAGNESIUM
Magnesium: 1.5 mg/dL — ABNORMAL LOW (ref 1.7–2.4)
Magnesium: 2.8 mg/dL — ABNORMAL HIGH (ref 1.7–2.4)

## 2019-11-09 LAB — ETHANOL: Alcohol, Ethyl (B): 72 mg/dL — ABNORMAL HIGH (ref <10)

## 2019-11-09 MED ORDER — POTASSIUM CHLORIDE CRYS ER 20 MEQ PO TBCR
40.0000 meq | EXTENDED_RELEASE_TABLET | Freq: Once | ORAL | Status: AC
  Administered 2019-11-09: 40 meq via ORAL
  Filled 2019-11-09: qty 2

## 2019-11-09 MED ORDER — MAGNESIUM SULFATE 4 GM/100ML IV SOLN
4.0000 g | Freq: Once | INTRAVENOUS | Status: AC
  Administered 2019-11-09: 4 g via INTRAVENOUS
  Filled 2019-11-09: qty 100

## 2019-11-09 MED ORDER — THIAMINE HCL 100 MG/ML IJ SOLN
Freq: Once | INTRAVENOUS | Status: AC
  Filled 2019-11-09: qty 1000

## 2019-11-09 MED ORDER — TETANUS-DIPHTH-ACELL PERTUSSIS 5-2.5-18.5 LF-MCG/0.5 IM SUSP: 0.5000 mL | Freq: Once | INTRAMUSCULAR | Status: DC

## 2019-11-09 NOTE — H&P (Signed)
History and Physical    Kelly Singh [id number] DOB: 04/18/1956 DOA: 11/08/2019  PCP: Rocco Serene, MD  Patient coming from: Home  Chief Complaint: Fall  HPI: Kelly Singh is a 64 y.o. female with medical history significant of cerebellum ataxia with frequent falls, fibromyalgia fell at home was bleeding from her head sent to emergency department by her daughter.  She denies any previous illnesses.  She has been dizzy however.  Her head laceration has been repaired in the emergency department.  Patient noted however to have a potassium level 2.6 and had persistent dizziness upon standing although her blood pressures were stable referred for admission for persistent dizziness in the setting of electrolyte abnormalities.  She also is intoxicated with alcohol.  She denies frequently drinking.  Review of Systems: As per HPI otherwise 10 point review of systems negative.   Past Medical History:  Diagnosis Date  . Ataxia   . Depression with anxiety   . Fibromyalgia   . Migraine     Past Surgical History:  Procedure Laterality Date  . ANKLE SURGERY Right 2011  . BREAST ENHANCEMENT SURGERY  1996  . TUBAL LIGATION  1979  . WRIST SURGERY Right 2016     reports that she quit smoking about 23 years ago. She has never used smokeless tobacco. She reports current alcohol use. She reports that she does not use drugs.  Allergies  Allergen Reactions  . Adhesive [Tape] Other (See Comments)    Plastic tape eats through skin - pls use paper tape  . Latex Other (See Comments)    Eats through skin    Family History  Problem Relation Age of Onset  . Bone cancer Mother   . Esophageal cancer Father     Prior to Admission medications   Medication Sig Start Date End Date Taking? Authorizing Provider  Apoaequorin (PREVAGEN PO) Take 1 tablet by mouth daily.    [provider]  baclofen (LIORESAL) 20 MG tablet Take 20 mg by mouth 3 (three) times daily.    [provider]  HYDROcodone-acetaminophen (NORCO) 10-325 MG tablet Take 1 tablet by mouth every 4 (four) hours as needed (pain).    [provider]  Multiple Vitamin (MULTIVITAMIN WITH MINERALS) TABS tablet Take 1 tablet by mouth daily.    [provider]  sertraline (ZOLOFT) 50 MG tablet Take 50 mg by mouth every morning.    [provider]  Vitamin D, Cholecalciferol, 25 MCG (1000 UT) TABS Take by mouth.    [provider]  vitamin E 1000 UNIT capsule Take 1,000 Units by mouth daily.    [provider]    Physical Exam: Vitals:   11/09/19 0045 11/09/19 0115 11/09/19 0130 11/09/19 0300  BP: 110/71 95/61 105/66 123/83  Pulse: (!) 115 81 84 83  Resp: (!) 23 16 12 19   Temp:      TempSrc:      SpO2: 99% 99% 98% 99%  Weight:      Height:          Constitutional: NAD, calm, comfortable Vitals:   11/09/19 0045 11/09/19 0115 11/09/19 0130 11/09/19 0300  BP: 110/71 95/61 105/66 123/83  Pulse: (!) 115 81 84 83  Resp: (!) 23 16 12 19   Temp:      TempSrc:      SpO2: 99% 99% 98% 99%  Weight:      Height:       Eyes: PERRL, lids and conjunctivae normal ENMT: Mucous  membranes are moist. Posterior pharynx clear of any exudate or lesions.Normal dentition.  Laceration to scalp Neck: normal, supple, no masses, no thyromegaly Respiratory: clear to auscultation bilaterally, no wheezing, no crackles. Normal respiratory effort. No accessory muscle use.  Cardiovascular: Regular rate and rhythm, no murmurs / rubs / gallops. No extremity edema. 2+ pedal pulses. No carotid bruits.  Abdomen: no tenderness, no masses palpated. No hepatosplenomegaly. Bowel sounds positive.  Musculoskeletal: no clubbing / cyanosis. No joint deformity upper and lower extremities. Good ROM, no contractures. Normal muscle tone.  Skin: no rashes, lesions, ulcers. No induration Neurologic: CN 2-12 grossly intact. Sensation intact, DTR normal. Strength 5/5 in all 4.  Psychiatric: Normal  judgment and insight. Alert and oriented x 3. Normal mood.    Labs on Admission: I have personally reviewed following labs and imaging studies  CBC: Recent Labs  Lab 11/08/19 2230  WBC 8.0  NEUTROABS 4.4  HGB 11.3*  HCT 34.6*  MCV 100.3*  PLT [id number]*   Basic Metabolic Panel: Recent Labs  Lab 11/08/19 2230  NA 140  K 2.7*  CL 107  CO2 23  GLUCOSE 164*  BUN 8  CREATININE 0.81  CALCIUM 7.6*   GFR: Estimated Creatinine Clearance: 66.5 mL/min (by C-G formula based on SCr of 0.81 mg/dL). Liver Function Tests: No results for input(s): AST, ALT, ALKPHOS, BILITOT, PROT, ALBUMIN in the last 168 hours. No results for input(s): LIPASE, AMYLASE in the last 168 hours. No results for input(s): AMMONIA in the last 168 hours. Coagulation Profile: No results for input(s): INR, PROTIME in the last 168 hours. Cardiac Enzymes: Recent Labs  Lab 11/03/19 1219  CKTOTAL 96   BNP (last 3 results) No results for input(s): PROBNP in the last 8760 hours. HbA1C: No results for input(s): HGBA1C in the last 72 hours. CBG: No results for input(s): GLUCAP in the last 168 hours. Lipid Profile: No results for input(s): CHOL, HDL, LDLCALC, TRIG, CHOLHDL, LDLDIRECT in the last 72 hours. Thyroid Function Tests: No results for input(s): TSH, T4TOTAL, FREET4, T3FREE, THYROIDAB in the last 72 hours. Anemia Panel: No results for input(s): VITAMINB12, FOLATE, FERRITIN, TIBC, IRON, RETICCTPCT in the last 72 hours. Urine analysis: No results found for: COLORURINE, APPEARANCEUR, LABSPEC, PHURINE, GLUCOSEU, HGBUR, BILIRUBINUR, KETONESUR, PROTEINUR, UROBILINOGEN, NITRITE, LEUKOCYTESUR Sepsis Labs: !!!!!!!!!!!!!!!!!!!!!!!!!!!!!!!!!!!!!!!!!!!! @LABRCNTIP (procalcitonin:4,lacticidven:4) )No results found for this or any previous visit (from the past 240 hour(s)).   Radiological Exams on Admission: CT Head Wo Contrast  Result Date: 11/09/2019 CLINICAL DATA:  Recent fall with head injury, initial encounter  EXAM: CT HEAD WITHOUT CONTRAST CT CERVICAL SPINE WITHOUT CONTRAST TECHNIQUE: Multidetector CT imaging of the head and cervical spine was performed following the standard protocol without intravenous contrast. Multiplanar CT image reconstructions of the cervical spine were also generated. COMPARISON:  08/06/2019 FINDINGS: CT HEAD FINDINGS Brain: No evidence of acute infarction, hemorrhage, hydrocephalus, extra-axial collection or mass lesion/mass effect. Vascular: No hyperdense vessel or unexpected calcification. Skull: Normal. Negative for fracture or focal lesion. Sinuses/Orbits: No acute finding. Other: Scalp laceration is noted on the left with mild hematoma. Multiple surgical staples seen. CT CERVICAL SPINE FINDINGS Alignment: Within normal limits. Skull base and vertebrae: 7 cervical segments are well visualized. Vertebral body height is well maintained. Multilevel osteophytic changes are seen. Endplate degenerative changes are seen. No acute fracture or acute facet abnormality is noted. Facet hypertrophic changes are seen. Soft tissues and spinal canal: Surrounding soft tissue structures are within limits. Upper chest: Visualized lung apices are within normal limits. Other: None  IMPRESSION: CT of the head: No acute intracranial abnormality noted. Left scalp laceration with surgical staples. CT of the cervical spine: Multilevel change without acute abnormality Electronically Signed   By: Inez Catalina M.D.   On: 11/09/2019 00:23   CT Cervical Spine Wo Contrast  Result Date: 11/09/2019 CLINICAL DATA:  Recent fall with head injury, initial encounter EXAM: CT HEAD WITHOUT CONTRAST CT CERVICAL SPINE WITHOUT CONTRAST TECHNIQUE: Multidetector CT imaging of the head and cervical spine was performed following the standard protocol without intravenous contrast. Multiplanar CT image reconstructions of the cervical spine were also generated. COMPARISON:  08/06/2019 FINDINGS: CT HEAD FINDINGS Brain: No evidence of acute  infarction, hemorrhage, hydrocephalus, extra-axial collection or mass lesion/mass effect. Vascular: No hyperdense vessel or unexpected calcification. Skull: Normal. Negative for fracture or focal lesion. Sinuses/Orbits: No acute finding. Other: Scalp laceration is noted on the left with mild hematoma. Multiple surgical staples seen. CT CERVICAL SPINE FINDINGS Alignment: Within normal limits. Skull base and vertebrae: 7 cervical segments are well visualized. Vertebral body height is well maintained. Multilevel osteophytic changes are seen. Endplate degenerative changes are seen. No acute fracture or acute facet abnormality is noted. Facet hypertrophic changes are seen. Soft tissues and spinal canal: Surrounding soft tissue structures are within limits. Upper chest: Visualized lung apices are within normal limits. Other: None IMPRESSION: CT of the head: No acute intracranial abnormality noted. Left scalp laceration with surgical staples. CT of the cervical spine: Multilevel change without acute abnormality Electronically Signed   By: Inez Catalina M.D.   On: 11/09/2019 00:23   Old chart reviewed Case discussed with EDP  Assessment/Plan 64 year old female with alcohol intoxication and cerebellum ataxia comes in with a fall and laceration to head with persistent dizziness Principal Problem:   Dizzy-CT head is negative except for the laceration.  Her potassium level is low we will replete and also check a magnesium level.  Orthostatics only positive for tachycardia blood pressure was stable.  Bleeding has stopped in the ED.  Ops for the day likely discharge later today after repeat labs are back and have stabilized.  Active Problems:   Gait abnormality-noted    Hypokalemia-replete p.o. and IV check mag level    Chronic hepatitis C without hepatic coma (HCC)-noted   etoh intox-noted give banana bag  Head laceration -repaired    DVT prophylaxis: SCDs Code Status: Full Family Communication:  None Disposition Plan: 24 hours or less Consults called: None Admission status: Observation   Azura Tufaro A MD [facility]  If 7PM-7AM, please contact night-coverage [email or web address] Password [facility]  11/09/2019, 4:19 AM

## 2019-11-09 NOTE — ED Notes (Signed)
Admitting Provider at bedside. 

## 2019-11-09 NOTE — Evaluation (Signed)
Physical Therapy Evaluation Patient Details Name: Kelly Singh MRN: [id number] DOB: 1956-08-23 Today's Date: 11/09/2019   History of Present Illness  Pt is a 64 y/o female admitted secondary to fall and dizziness. CT of head negative at baseline. Pt with cerebellar ataxia at baseline. PMH includes hep C and fibromyalgia.   Clinical Impression  Pt admitted secondary to problem above with deficits below. Pt with unsteadiness at baseline, and reports her gait was close to baseline. Required min guard to min A for short distance gait within the room in the ER. Pt reports she plans to return home with assist from granddaughter. Will continue to follow acutely to maximize functional mobility independence and safety.     Follow Up Recommendations Home health PT;Supervision for mobility/OOB    Equipment Recommendations  Rolling walker with 5" wheels    Recommendations for Other Services       Precautions / Restrictions Precautions Precautions: Fall Restrictions Weight Bearing Restrictions: No      Mobility  Bed Mobility Overal bed mobility: Modified Independent                Transfers Overall transfer level: Needs assistance Equipment used: None Transfers: Sit to/from Stand Sit to Stand: Min guard         General transfer comment: Min guard for steadying assist  Ambulation/Gait Ambulation/Gait assistance: Min assist;Min guard Gait Distance (Feet): 10 Feet Assistive device: 1 person hand held assist Gait Pattern/deviations: Step-through pattern;Decreased stride length;Ataxic Gait velocity: Decreased   General Gait Details: Decreased coordination and mild unsteadiness, however, pt reports this is baseline. Min guard to min A for steadying.   Stairs            Wheelchair Mobility    Modified Rankin (Stroke Patients Only)       Balance Overall balance assessment: Needs assistance Sitting-balance support: No upper extremity supported;Feet  supported Sitting balance-Leahy Scale: Good     Standing balance support: Single extremity supported;During functional activity Standing balance-Leahy Scale: Poor Standing balance comment: Reliant on at least 1 UE support                              Pertinent Vitals/Pain Pain Assessment: No/denies pain    Home Living Family/patient expects to be discharged to:: Private residence Living Arrangements: Other relatives(granddaughter) Available Help at Discharge: Family;Available 24 hours/day Type of Home: Apartment Home Access: Level entry     Home Layout: One level Home Equipment: Grab bars - tub/shower;Other (comment)(walking stick)      Prior Function Level of Independence: Independent with assistive device(s)         Comments: Pt reports she uses walking stick at baseline. Gait instability at baseline     Hand Dominance        Extremity/Trunk Assessment   Upper Extremity Assessment Upper Extremity Assessment: Generalized weakness    Lower Extremity Assessment Lower Extremity Assessment: Generalized weakness(decreased coordination at baseline)    Cervical / Trunk Assessment Cervical / Trunk Assessment: Normal  Communication   Communication: No difficulties  Cognition Arousal/Alertness: Awake/alert Behavior During Therapy: WFL for tasks assessed/performed Overall Cognitive Status: Within Functional Limits for tasks assessed                                        General Comments      Exercises  Assessment/Plan    PT Assessment Patient needs continued PT services  PT Problem List Decreased strength;Decreased balance;Decreased mobility;Decreased coordination;Decreased knowledge of use of DME;Decreased knowledge of precautions       PT Treatment Interventions Gait training;DME instruction;Therapeutic activities;Functional mobility training;Therapeutic exercise;Balance training;Patient/family education    PT Goals  (Current goals can be found in the Care Plan section)  Acute Rehab PT Goals Patient Stated Goal: to go home PT Goal Formulation: With patient Time For Goal Achievement: 11/23/19 Potential to Achieve Goals: Good    Frequency Min 3X/week   Barriers to discharge        Co-evaluation               AM-PAC PT "6 Clicks" Mobility  Outcome Measure Help needed turning from your back to your side while in a flat bed without using bedrails?: None Help needed moving from lying on your back to sitting on the side of a flat bed without using bedrails?: None Help needed moving to and from a bed to a chair (including a wheelchair)?: A Little Help needed standing up from a chair using your arms (e.g., wheelchair or bedside chair)?: A Little Help needed to walk in hospital room?: A Little Help needed climbing 3-5 steps with a railing? : A Lot 6 Click Score: 19    End of Session Equipment Utilized During Treatment: Gait belt Activity Tolerance: Patient tolerated treatment well Patient left: in bed;with call bell/phone within reach Nurse Communication: Mobility status PT Visit Diagnosis: Unsteadiness on feet (R26.81);Muscle weakness (generalized) (M62.81);History of falling (Z91.81)    Time: [id number] PT Time Calculation (min) (ACUTE ONLY): 15 min   Charges:   PT Evaluation $PT Eval Moderate Complexity: 1 Mod          Reuel Derby, PT, DPT  Acute Rehabilitation Services  Pager: [phone] Office: [phone]   Rudean Hitt 11/09/2019, 1:19 PM

## 2019-11-09 NOTE — Discharge Instructions (Signed)
Laceration Care, Adult  A laceration is a cut that may go through all layers of the skin and into the tissue that is right under the skin. Some lacerations heal on their own. Others need to be closed with stitches (sutures), staples, skin adhesive strips, or skin glue. Proper care of a laceration reduces the risk for infection, helps the laceration heal better, and may prevent scarring.  How to care for your laceration  Wash your hands with soap and water before touching your wound or changing your bandage (dressing). If soap and water are not available, use hand sanitizer.  Keep the wound clean and dry.  If you were given a dressing, you should change it at least once a day, or as told by your health care provider. You should also change it if it becomes wet or dirty.  If sutures or staples were used:  · Keep the wound completely dry for the first 24 hours, or as told by your health care provider. After that time, you may shower or bathe. However, make sure that the wound is not soaked in water until after the sutures or staples have been removed.  · Clean the wound once each day, or as told by your health care provider:  ? Wash the wound with soap and water.  ? Rinse the wound with water to remove all soap.  ? Pat the wound dry with a clean towel. Do not rub the wound.  · After cleaning the wound, apply a thin layer of antibiotic ointment as told by your health care provider. This will help prevent infection and keep the dressing from sticking to the wound.  · Have the sutures or staples removed as told by your health care provider.  If skin adhesive strips were used:  · Do not get the skin adhesive strips wet. You may shower or bathe, but be careful to keep the wound dry.  · If the wound gets wet, pat it dry with a clean towel. Do not rub the wound.  · Skin adhesive strips fall off on their own. You may trim the strips as the wound heals. Do not remove skin adhesive strips that are still stuck to the wound. They  will fall off in time.  If skin glue was used:  · Try to keep the wound dry, but you may briefly wet it in the shower or bath. Do not soak the wound in water, such as by swimming.  · After you have showered or bathed, gently pat the wound dry with a clean towel. Do not rub the wound.  · Do not do any activities that will make you sweat heavily until the skin glue has fallen off on its own.  · Do not apply liquid, cream, or ointment medicine to the wound while the skin glue is in place. Using those may loosen the film before the wound has healed.  · If a dressing is placed over the wound, be careful not to apply tape directly over the skin glue. Doing that may cause the glue to be pulled off before the wound has healed.  · Do not pick at the glue. Skin glue usually remains in place for 5-10 days and then falls off the skin.  General instructions    · Take over-the-counter and prescription medicines only as told by your health care provider.  · If you were prescribed an antibiotic medicine or ointment, take or apply it as told by your health care provider.   infection. Watch for: ? Redness, swelling, or pain. ? Fluid, blood, or pus.  Raise (elevate) the injured area above the level of your heart while you are sitting or lying down for the first 24-48 hours after the laceration is repaired.  If directed, put ice on the affected area: ? Put ice in a plastic bag. ? Place a towel between your skin and the bag. ? Leave the ice on for 20 minutes, 2-3 times a day.  Keep all follow-up visits as told by your health care provider. This is important. Contact a health care provider if:  You received a tetanus shot and you have swelling, severe pain, redness, or bleeding at the injection site.  You have a fever.  A wound that was closed breaks open.  You notice a bad smell  coming from your wound or your dressing.  You notice something coming out of the wound, such as wood or glass.  Your pain is not controlled with medicine.  You have increased redness, swelling, or pain at the site of your wound.  You have fluid, blood, or pus coming from your wound.  You need to change the dressing often due to fluid, blood, or pus that is draining from the wound.  You develop a new rash.  You develop numbness around the wound. Get help right away if:  You develop severe swelling around the wound.  Your pain suddenly increases and is severe.  You develop painful lumps near the wound or on skin anywhere else on your body.  You have a red streak going away from your wound.  The wound is on your hand or foot and you cannot properly move a finger or toe.  The wound is on your hand or foot, and you notice that your fingers or toes look pale or bluish. Summary  A laceration is a cut that may go through all layers of the skin and into the tissue that is right under the skin.  Some lacerations heal on their own. Others need to be closed with stitches (sutures), staples, skin adhesive strips, or skin glue.  Proper care of a laceration reduces the risk of infection, helps the laceration heal better, and prevents scarring. This information is not intended to replace advice given to you by your health care provider. Make sure you discuss any questions you have with your health care provider. Document Revised: 11/20/2017 Document Reviewed: 10/12/2017 Elsevier Patient Education  [place].

## 2019-11-09 NOTE — Discharge Summary (Signed)
Physician Discharge Summary  Kelly Singh [id number] DOB: 09-13-1956 DOA: 11/08/2019  PCP: Rocco Serene, MD  Admit date: 11/08/2019 Discharge date: 11/09/2019  Admitted From: Home Disposition: Home with home health  Recommendations for Outpatient Follow-up:  1. Follow up with PCP in 1-2 weeks 2. Please obtain BMP/CBC in one week 3. Please follow up on the following pending results:  Home Health: Yes Equipment/Devices: Rolling walker  Discharge Condition: Stable CODE STATUS: Full code Diet recommendation: Cardiac  Subjective: Seen and examined.  Feels much better.  Tells me that she has been falling for several months due to having problem with her balance.  Denies any burning pain in the legs.   HPI: Kelly Singh is a 64 y.o. female with medical history significant of cerebellum ataxia with frequent falls, fibromyalgia fell at home was bleeding from her head sent to emergency department by her daughter.  She denies any previous illnesses.  She has been dizzy however.  Her head laceration has been repaired in the emergency department.  Patient noted however to have a potassium level 2.6 and had persistent dizziness upon standing although her blood pressures were stable referred for admission for persistent dizziness in the setting of electrolyte abnormalities.  She also is intoxicated with alcohol.  She denies frequently drinking.  Brief/Interim Summary: Patient was brought into the emergency department due to fall.  She was also found to have hypomagnesemia and hypokalemia.  She was found to have head laceration which was stapled in the emergency department by ED physician.  She was briefly admitted by hospitalist service for observation.  Provided with IV fluids.  Due to her complaint of balance problems, she was seen by PT OT and they recommended home health PT as well as a rolling walker for which social worker has been consulted and orders have been placed.  Magnesium and  potassium was replaced and they both are within normal range now.  She is a stable for discharge.  I also called her daughter [name] and I spoke to her about discharge plan and she was in agreement.  Discharge Diagnoses:  Principal Problem:   Dizzy Active Problems:   Chronic hepatitis C without hepatic coma (HCC)   Gait abnormality   Hypokalemia   Fall   Hypomagnesemia   Scalp laceration    Discharge Instructions  Discharge Instructions    Discharge patient   Complete by: As directed    Discharge disposition: 06-Home-Health Care Svc   Discharge patient date: 11/09/2019     Allergies as of 11/09/2019      Reactions   Adhesive [tape] Other (See Comments)   Plastic tape eats through skin - pls use paper tape   Latex Other (See Comments)   Eats through skin      Medication List    TAKE these medications   baclofen 20 MG tablet Commonly known as: LIORESAL Take 20 mg by mouth 3 (three) times daily.   BIOTIN PLUS KERATIN PO Take 1 tablet by mouth daily.   HYDROcodone-acetaminophen 10-325 MG tablet Commonly known as: NORCO Take 1 tablet by mouth every 4 (four) hours as needed (pain).   multivitamin with minerals Tabs tablet Take 1 tablet by mouth daily.   OVER THE COUNTER MEDICATION Take 1 tablet by mouth daily. Keto   sertraline 50 MG tablet Commonly known as: ZOLOFT Take 50 mg by mouth every morning.   Vitamin D (Cholecalciferol) 25 MCG (1000 UT) Tabs Take by mouth.  Durable Medical Equipment  (From admission, onward)         Start     Ordered   11/09/19 1503  For home use only DME Walker rolling  Once    Question Answer Comment  Walker: With 5 Inch Wheels   Patient needs a walker to treat with the following condition Balance problem      11/09/19 1502         Follow-up Information    Rocco Serene, MD Follow up in 1 week(s).   Specialty: Internal Medicine Contact information: [place] [address] [phone]           Allergies  Allergen Reactions  . Adhesive [Tape] Other (See Comments)    Plastic tape eats through skin - pls use paper tape  . Latex Other (See Comments)    Eats through skin    Consultations: None   Procedures/Studies: CT Head Wo Contrast  Result Date: 11/09/2019 CLINICAL DATA:  Recent fall with head injury, initial encounter EXAM: CT HEAD WITHOUT CONTRAST CT CERVICAL SPINE WITHOUT CONTRAST TECHNIQUE: Multidetector CT imaging of the head and cervical spine was performed following the standard protocol without intravenous contrast. Multiplanar CT image reconstructions of the cervical spine were also generated. COMPARISON:  08/06/2019 FINDINGS: CT HEAD FINDINGS Brain: No evidence of acute infarction, hemorrhage, hydrocephalus, extra-axial collection or mass lesion/mass effect. Vascular: No hyperdense vessel or unexpected calcification. Skull: Normal. Negative for fracture or focal lesion. Sinuses/Orbits: No acute finding. Other: Scalp laceration is noted on the left with mild hematoma. Multiple surgical staples seen. CT CERVICAL SPINE FINDINGS Alignment: Within normal limits. Skull base and vertebrae: 7 cervical segments are well visualized. Vertebral body height is well maintained. Multilevel osteophytic changes are seen. Endplate degenerative changes are seen. No acute fracture or acute facet abnormality is noted. Facet hypertrophic changes are seen. Soft tissues and spinal canal: Surrounding soft tissue structures are within limits. Upper chest: Visualized lung apices are within normal limits. Other: None IMPRESSION: CT of the head: No acute intracranial abnormality noted. Left scalp laceration with surgical staples. CT of the cervical spine: Multilevel change without acute abnormality Electronically Signed   By: Inez Catalina M.D.   On: 11/09/2019 00:23   CT Cervical Spine Wo Contrast  Result Date: 11/09/2019 CLINICAL DATA:  Recent fall with head injury, initial encounter EXAM: CT HEAD  WITHOUT CONTRAST CT CERVICAL SPINE WITHOUT CONTRAST TECHNIQUE: Multidetector CT imaging of the head and cervical spine was performed following the standard protocol without intravenous contrast. Multiplanar CT image reconstructions of the cervical spine were also generated. COMPARISON:  08/06/2019 FINDINGS: CT HEAD FINDINGS Brain: No evidence of acute infarction, hemorrhage, hydrocephalus, extra-axial collection or mass lesion/mass effect. Vascular: No hyperdense vessel or unexpected calcification. Skull: Normal. Negative for fracture or focal lesion. Sinuses/Orbits: No acute finding. Other: Scalp laceration is noted on the left with mild hematoma. Multiple surgical staples seen. CT CERVICAL SPINE FINDINGS Alignment: Within normal limits. Skull base and vertebrae: 7 cervical segments are well visualized. Vertebral body height is well maintained. Multilevel osteophytic changes are seen. Endplate degenerative changes are seen. No acute fracture or acute facet abnormality is noted. Facet hypertrophic changes are seen. Soft tissues and spinal canal: Surrounding soft tissue structures are within limits. Upper chest: Visualized lung apices are within normal limits. Other: None IMPRESSION: CT of the head: No acute intracranial abnormality noted. Left scalp laceration with surgical staples. CT of the cervical spine: Multilevel change without acute abnormality Electronically Signed  By: Inez Catalina M.D.   On: 11/09/2019 00:23   DG ESOPHAGUS W DOUBLE CM (HD)  Result Date: 10/25/2019 CLINICAL DATA:  64 year old female with history of dysphagia. EXAM: ESOPHOGRAM / BARIUM SWALLOW / BARIUM TABLET STUDY TECHNIQUE: Combined double contrast and single contrast examination performed using effervescent crystals, thick barium liquid, and thin barium liquid. The patient was observed with fluoroscopy swallowing a 13 mm barium sulphate tablet. FLUOROSCOPY TIME:  Fluoroscopy Time:  2 minutes Radiation Exposure Index (if provided by  the fluoroscopic device): 11.1 mGy COMPARISON:  None. FINDINGS: Double contrast images demonstrated a normal appearance of the esophageal mucosa. Multiple single swallow attempts were observed, which demonstrated normal esophageal motility. Full column esophagram demonstrated no esophageal mass, stricture or esophageal ring. Tiny hiatal hernia. Water siphon test demonstrated no gastroesophageal reflux. A barium tablet was administered, which passed readily into the stomach. IMPRESSION: 1. Tiny hiatal hernia.  Otherwise, structurally normal esophagus. 2. Normal esophageal motility. Electronically Signed   By: Vinnie Langton M.D.   On: 10/25/2019 12:03      Discharge Exam: Vitals:   11/09/19 1415 11/09/19 1446  BP: (!) 99/56 (!) 119/56  Pulse: 83 84  Resp: 18 17  Temp:  97.7 F (36.5 C)  SpO2: 96% 100%   Vitals:   11/09/19 1245 11/09/19 1330 11/09/19 1415 11/09/19 1446  BP: (!) 99/55 109/63 (!) 99/56 (!) 119/56  Pulse: 74 91 83 84  Resp: 16 (!) 21 18 17   Temp:    97.7 F (36.5 C)  TempSrc:    Oral  SpO2: 99% 98% 96% 100%  Weight:      Height:        General: Pt is alert, awake, not in acute distress Cardiovascular: RRR, S1/S2 +, no rubs, no gallops Respiratory: CTA bilaterally, no wheezing, no rhonchi Abdominal: Soft, NT, ND, bowel sounds + Extremities: no edema, no cyanosis    The results of significant diagnostics from this hospitalization (including imaging, microbiology, ancillary and laboratory) are listed below for reference.     Microbiology: Recent Results (from the past 240 hour(s))  SARS CORONAVIRUS 2 (TAT 6-24 HRS) Nasopharyngeal Nasopharyngeal Swab     Status: None   Collection Time: 11/09/19  3:37 AM   Specimen: Nasopharyngeal Swab  Result Value Ref Range Status   SARS Coronavirus 2 NEGATIVE NEGATIVE Final    Comment: (NOTE) SARS-CoV-2 target nucleic acids are NOT DETECTED. The SARS-CoV-2 RNA is generally detectable in upper and lower respiratory  specimens during the acute phase of infection. Negative results do not preclude SARS-CoV-2 infection, do not rule out co-infections with other pathogens, and should not be used as the sole basis for treatment or other patient management decisions. Negative results must be combined with clinical observations, patient history, and epidemiological information. The expected result is Negative. Fact Sheet for Patients: SugarRoll.be Fact Sheet for Healthcare Providers: https://www.woods-mathews.com/ This test is not yet approved or cleared by the [place] FDA and  has been authorized for detection and/or diagnosis of SARS-CoV-2 by FDA under an Emergency Use Authorization (EUA). This EUA will remain  in effect (meaning this test can be used) for the duration of the COVID-19 declaration under Section 56 4(b)(1) of the Act, 21 U.S.C. section 360bbb-3(b)(1), unless the authorization is terminated or revoked sooner. Performed at [facility], [place] [address], [place], [place] [address]      Labs: BNP (last 3 results) No results for input(s): BNP in the last 8760 hours. Basic Metabolic Panel: Recent  Labs  Lab 11/08/19 2230 11/09/19 0400 11/09/19 1217  NA 140  --  137  K 2.7*  --  4.2  CL 107  --  110  CO2 23  --  24  GLUCOSE 164*  --  114*  BUN 8  --  8  CREATININE 0.81  --  0.68  CALCIUM 7.6*  --  7.5*  MG  --  1.5* 2.8*   Liver Function Tests: Recent Labs  Lab 11/09/19 0400 11/09/19 1217  AST 54* 50*  ALT 49* 46*  ALKPHOS 87 82  BILITOT 1.3* 1.5*  PROT 5.5* 5.1*  ALBUMIN 2.4* 2.3*   No results for input(s): LIPASE, AMYLASE in the last 168 hours. Recent Labs  Lab 11/09/19 0400  AMMONIA 14   CBC: Recent Labs  Lab 11/08/19 2230 11/09/19 0639 11/09/19 1217  WBC 8.0  --  6.6  NEUTROABS 4.4  --   --   HGB 11.3* 8.7* 9.0*  HCT 34.6* 26.7* 28.6*  MCV 100.3*  --  101.8*  PLT 139*  --  114*   Cardiac  Enzymes: Recent Labs  Lab 11/03/19 1219  CKTOTAL 96   BNP: Invalid input(s): POCBNP CBG: No results for input(s): GLUCAP in the last 168 hours. D-Dimer No results for input(s): DDIMER in the last 72 hours. Hgb A1c No results for input(s): HGBA1C in the last 72 hours. Lipid Profile No results for input(s): CHOL, HDL, LDLCALC, TRIG, CHOLHDL, LDLDIRECT in the last 72 hours. Thyroid function studies No results for input(s): TSH, T4TOTAL, T3FREE, THYROIDAB in the last 72 hours.  Invalid input(s): FREET3 Anemia work up No results for input(s): VITAMINB12, FOLATE, FERRITIN, TIBC, IRON, RETICCTPCT in the last 72 hours. Urinalysis    Component Value Date/Time   COLORURINE YELLOW 11/09/2019 0400   APPEARANCEUR CLEAR 11/09/2019 0400   LABSPEC 1.014 11/09/2019 0400   PHURINE 5.0 11/09/2019 0400   GLUCOSEU NEGATIVE 11/09/2019 0400   HGBUR NEGATIVE 11/09/2019 0400   BILIRUBINUR NEGATIVE 11/09/2019 0400   KETONESUR NEGATIVE 11/09/2019 0400   PROTEINUR NEGATIVE 11/09/2019 0400   NITRITE NEGATIVE 11/09/2019 0400   LEUKOCYTESUR NEGATIVE 11/09/2019 0400   Sepsis Labs Invalid input(s): PROCALCITONIN,  WBC,  LACTICIDVEN Microbiology Recent Results (from the past 240 hour(s))  SARS CORONAVIRUS 2 (TAT 6-24 HRS) Nasopharyngeal Nasopharyngeal Swab     Status: None   Collection Time: 11/09/19  3:37 AM   Specimen: Nasopharyngeal Swab  Result Value Ref Range Status   SARS Coronavirus 2 NEGATIVE NEGATIVE Final    Comment: (NOTE) SARS-CoV-2 target nucleic acids are NOT DETECTED. The SARS-CoV-2 RNA is generally detectable in upper and lower respiratory specimens during the acute phase of infection. Negative results do not preclude SARS-CoV-2 infection, do not rule out co-infections with other pathogens, and should not be used as the sole basis for treatment or other patient management decisions. Negative results must be combined with clinical observations, patient history, and epidemiological  information. The expected result is Negative. Fact Sheet for Patients: SugarRoll.be Fact Sheet for Healthcare Providers: https://www.woods-mathews.com/ This test is not yet approved or cleared by the [place] FDA and  has been authorized for detection and/or diagnosis of SARS-CoV-2 by FDA under an Emergency Use Authorization (EUA). This EUA will remain  in effect (meaning this test can be used) for the duration of the COVID-19 declaration under Section 56 4(b)(1) of the Act, 21 U.S.C. section 360bbb-3(b)(1), unless the authorization is terminated or revoked sooner. Performed at [facility], [place] [address]  [place], [place] [address]      Time coordinating discharge: Over 30 minutes  SIGNED:   Darliss Cheney, MD  [facility] 11/09/2019, 3:04 PM  If 7PM-7AM, please contact night-coverage [email or web address]

## 2019-11-09 NOTE — TOC Initial Note (Signed)
Transition of Care [facility]) - Initial/Assessment Note    Patient Details  Name: Kelly Singh MRN: [id number] Date of Birth: 1956/03/02  Transition of Care [facility]) CM/SW Contact:    Marilu Favre, RN Phone Number: 11/09/2019, 3:49 PM  Clinical Narrative:                 Patient from home with grand daughter. Discussed home health provided choice, no preference , referral given to and accepted by Cassie.   Ordered walker with Zack with [facility].  Expected Discharge Plan: [place] Barriers to Discharge: No Barriers Identified   Patient Goals and CMS Choice Patient states their goals for this hospitalization and ongoing recovery are:: to return to home CMS Medicare.gov Compare Post Acute Care list provided to:: Patient Choice offered to / list presented to : Patient  Expected Discharge Plan and Services Expected Discharge Plan: [place]   Discharge Planning Services: CM Consult Post Acute Care Choice: Home Health, Durable Medical Equipment Living arrangements for the past 2 months: Apartment Expected Discharge Date: 11/09/19               DME Arranged: Gilford Rile rolling DME Agency: [employer] Date DME Agency Contacted: 11/09/19 Time DME Agency Contacted: [phone] Representative spoke with at DME Agency: Zack HH Arranged: PT, OT [place] Date [place]: 11/09/19 Time [place]: 1548 Representative spoke with at [place] Arrangements/Services Living arrangements for the past 2 months: [place] with:: Other (Comment)(grand daughter) Patient language and need for interpreter reviewed:: Yes Do you feel safe going back to the place where you live?: Yes      Need for Family Participation in Patient Care: Yes (Comment) Care giver support system in place?: Yes (comment)   Criminal Activity/Legal Involvement Pertinent to Current Situation/Hospitalization: No - Comment  as needed  Activities of Daily Living      Permission Sought/Granted   Permission granted to share information with : No              Emotional Assessment Appearance:: Appears stated age Attitude/Demeanor/Rapport: Engaged Affect (typically observed): Accepting Orientation: : Oriented to Place, Oriented to  Time, Oriented to Situation, Oriented to Self Alcohol / Substance Use: Not Applicable Psych Involvement: No (comment)  Admission diagnosis:  Hypokalemia [E87.6] Fall [W19.XXXA] Dizzy [R42] Injury of head, initial encounter [id number] Fall, initial encounter [W19.XXXA] Laceration of scalp, initial encounter [S01.01XA] Patient Active Problem List   Diagnosis Date Noted  . Dizzy 11/09/2019  . Fall 11/09/2019  . Hypomagnesemia 11/09/2019  . Scalp laceration 11/09/2019  . Alcohol intoxication ([place]) 11/09/2019  . Hypokalemia   . Gait abnormality 11/03/2019  . Chronic hepatitis C without hepatic coma ([place]) 11/01/2019   PCP:  Rocco Serene, MD Pharmacy:   [facility], [place] [address] [place] [address] Phone: [phone] Fax: [phone]     Social Determinants of Health (SDOH) Interventions    Readmission Risk Interventions No flowsheet data found.

## 2019-11-21 ENCOUNTER — Other Ambulatory Visit: Payer: Medicare Other

## 2019-12-02 ENCOUNTER — Telehealth: Payer: Self-pay

## 2019-12-02 NOTE — Telephone Encounter (Signed)
COVID-19 Pre-Screening Questions:12/02/19  Do you currently have a fever (>100 F), chills or unexplained body aches?NO   Are you currently experiencing new cough, shortness of breath, sore throat, runny nose? NO .  Have you recently travelled outside the state of [place] in the last 14 days?NO .  Have you been in contact with someone that is currently pending confirmation of Covid19 testing or has been confirmed to have the [place] virus? NO   **If the patient answers NO to ALL questions -  advise the patient to please call the clinic before coming to the office should any symptoms develop.

## 2019-12-05 ENCOUNTER — Ambulatory Visit (INDEPENDENT_AMBULATORY_CARE_PROVIDER_SITE_OTHER): Payer: Medicare Other | Admitting: Family

## 2019-12-05 ENCOUNTER — Encounter: Payer: Self-pay | Admitting: Physician Assistant

## 2019-12-05 ENCOUNTER — Encounter: Payer: Self-pay | Admitting: Family

## 2019-12-05 ENCOUNTER — Other Ambulatory Visit: Payer: Self-pay

## 2019-12-05 VITALS — BP 145/78 | HR 69 | Wt 140.0 lb

## 2019-12-05 DIAGNOSIS — B182 Chronic viral hepatitis C: Secondary | ICD-10-CM | POA: Diagnosis not present

## 2019-12-05 DIAGNOSIS — K7469 Other cirrhosis of liver: Secondary | ICD-10-CM | POA: Insufficient documentation

## 2019-12-05 NOTE — Progress Notes (Signed)
Subjective:    Patient ID: Kelly Singh, female    DOB: November 18, 1955, 64 y.o.   MRN: [id number]  Chief Complaint  Patient presents with  . Follow-up    no complaints     HPI:  Kelly Singh is a 64 y.o. female with previous history of alcohol abuse and chronic hepatitis C who was last seen in the office on 11/01/2019 and found to have genotype 1a with viral load of 2 million and fibrosis scores of F4 with elevated APRI and FIB4 scores concerning for cirrhosis.  Kelly Singh returns today for follow up and review of her lab work. Overall feeling well with no symptoms currently. She has been recently seen in the ED for head injury and is scheduled for chest CT due to a nodule.    Allergies  Allergen Reactions  . Adhesive [Tape] Other (See Comments)    Plastic tape eats through skin - pls use paper tape  . Latex Other (See Comments)    Eats through skin      Outpatient Medications Prior to Visit  Medication Sig Dispense Refill  . baclofen (LIORESAL) 20 MG tablet Take 20 mg by mouth 3 (three) times daily.    Marland Kitchen HYDROcodone-acetaminophen (NORCO) 10-325 MG tablet Take 1 tablet by mouth every 4 (four) hours as needed (pain).    Marland Kitchen sertraline (ZOLOFT) 50 MG tablet Take 50 mg by mouth every morning.    . Multiple Vitamin (MULTIVITAMIN WITH MINERALS) TABS tablet Take 1 tablet by mouth daily.    Marland Kitchen OVER THE COUNTER MEDICATION Take 1 tablet by mouth daily. Keto    . Specialty Vitamins Products (BIOTIN PLUS KERATIN PO) Take 1 tablet by mouth daily.    . Vitamin D, Cholecalciferol, 25 MCG (1000 UT) TABS Take by mouth.     No facility-administered medications prior to visit.     Past Medical History:  Diagnosis Date  . Ataxia   . Depression with anxiety   . Fibromyalgia   . Migraine      Past Surgical History:  Procedure Laterality Date  . ANKLE SURGERY Right 2011  . BREAST ENHANCEMENT SURGERY  1996  . TUBAL LIGATION  1979  . WRIST SURGERY Right 2016       Review of  Systems  Constitutional: Negative for chills, fatigue, fever and unexpected weight change.  Respiratory: Negative for cough, chest tightness, shortness of breath and wheezing.   Cardiovascular: Negative for chest pain and leg swelling.  Gastrointestinal: Negative for abdominal distention, constipation, diarrhea, nausea and vomiting.  Neurological: Negative for dizziness, weakness, light-headedness and headaches.  Hematological: Does not bruise/bleed easily.      Objective:    BP (!) 145/78   Pulse 69   Wt 140 lb (63.5 kg)   BMI 22.60 kg/m  Nursing note and vital signs reviewed.  Physical Exam Constitutional:      General: She is not in acute distress.    Appearance: She is well-developed.     Comments: Seated in the chair; pleasant; appears/sounds intoxicated.   Cardiovascular:     Rate and Rhythm: Normal rate and regular rhythm.     Heart sounds: Normal heart sounds. No murmur. No friction rub. No gallop.   Pulmonary:     Effort: Pulmonary effort is normal. No respiratory distress.     Breath sounds: Normal breath sounds. No wheezing or rales.  Chest:     Chest wall: No tenderness.  Abdominal:     General: Bowel sounds are normal.  There is no distension.     Palpations: Abdomen is soft. There is no mass.     Tenderness: There is no abdominal tenderness. There is no guarding or rebound.  Skin:    General: Skin is warm and dry.  Neurological:     Mental Status: She is alert and oriented to person, place, and time.  Psychiatric:        Behavior: Behavior normal.        Thought Content: Thought content normal.        Judgment: Judgment normal.      Depression screen PHQ 2/9 12/05/2019  Decreased Interest 0  Down, Depressed, Hopeless 0  PHQ - 2 Score 0       Assessment & Plan:    Patient Active Problem List   Diagnosis Date Noted  . Other cirrhosis of liver ([place]) 12/05/2019  . Dizzy 11/09/2019  . Fall 11/09/2019  . Hypomagnesemia 11/09/2019  . Scalp laceration  11/09/2019  . Alcohol intoxication ([place]) 11/09/2019  . Hypokalemia   . Gait abnormality 11/03/2019  . Chronic hepatitis C without hepatic coma ([place]) 11/01/2019     Problem List Items Addressed This Visit      Digestive   Chronic hepatitis C without hepatic coma ([place]) - Primary    Kelly Singh has genotype 1a hepatitis C with viral load of 2 million and fibrosis scores of F4 concerning for decompensated cirrhosis.  We discussed her lab work results and the plan of care.  Obtain abdominal ultrasound to rule out possibility of hepatocellular carcinoma and refer to gastroenterology for cirrhosis work-up.  Plan for 24 weeks with Epclusa for decompensated cirrhosis pending ultrasound results.  If concerns for hepatocellular carcinoma exist will hold treatment.      Relevant Orders   Ambulatory referral to Gastroenterology   US Abdomen Complete   Other cirrhosis of liver [facility])    Kelly Singh has a fibrosis score of F4 and elevated APRI and FIB4 concerning for cirrhosis. Review of lab work with Child-Pugh Class B concerning for decompensated cirrhosis. Obtain ultrasound and refer to gastroenterology.       Relevant Orders   Ambulatory referral to Gastroenterology   US Abdomen Complete       I am having Kelly Singh "Kelly Singh" maintain her sertraline, HYDROcodone-acetaminophen, multivitamin with minerals, baclofen, Vitamin D (Cholecalciferol), Specialty Vitamins Products (BIOTIN PLUS KERATIN PO), and OVER THE COUNTER MEDICATION.    Follow-up: Pending ultrasound results.   Kelly Piedra, MSN, FNP-C Nurse Practitioner [facility] for Infectious Disease [place] number: [phone]

## 2019-12-05 NOTE — Assessment & Plan Note (Signed)
Ms. Stroupe has genotype 1a hepatitis C with viral load of 2 million and fibrosis scores of F4 concerning for decompensated cirrhosis.  We discussed her lab work results and the plan of care.  Obtain abdominal ultrasound to rule out possibility of hepatocellular carcinoma and refer to gastroenterology for cirrhosis work-up.  Plan for 24 weeks with Epclusa for decompensated cirrhosis pending ultrasound results.  If concerns for hepatocellular carcinoma exist will hold treatment.

## 2019-12-05 NOTE — Assessment & Plan Note (Signed)
Kelly Singh has a fibrosis score of F4 and elevated APRI and FIB4 concerning for cirrhosis. Review of lab work with Child-Pugh Class B concerning for decompensated cirrhosis. Obtain ultrasound and refer to gastroenterology.

## 2019-12-05 NOTE — Patient Instructions (Signed)
Nice to see you.  We will get you an ultrasound and set up with gastroenterology.  If your ultrasound looks good we will treat you 24 weeks of Epclusa.  Let us know if you have any questions.  Have a great day and stay safe!

## 2019-12-12 ENCOUNTER — Other Ambulatory Visit: Payer: Self-pay

## 2019-12-12 ENCOUNTER — Ambulatory Visit
Admission: RE | Admit: 2019-12-12 | Discharge: 2019-12-12 | Disposition: A | Discharge status: Home / Self Care | Payer: Medicare Other | Source: Ambulatory Visit | Attending: Oncology | Admitting: Oncology

## 2019-12-12 ENCOUNTER — Ambulatory Visit (HOSPITAL_COMMUNITY)
Admission: RE | Admit: 2019-12-12 | Discharge: 2019-12-12 | Disposition: A | Discharge status: Home / Self Care | Payer: Medicare Other | Source: Ambulatory Visit | Attending: Family | Admitting: Family

## 2019-12-12 ENCOUNTER — Ambulatory Visit
Admission: RE | Admit: 2019-12-12 | Discharge: 2019-12-12 | Disposition: A | Discharge status: Home / Self Care | Payer: Medicare Other | Source: Ambulatory Visit | Attending: Neurology | Admitting: Neurology

## 2019-12-12 DIAGNOSIS — K7469 Other cirrhosis of liver: Secondary | ICD-10-CM | POA: Diagnosis not present

## 2019-12-12 DIAGNOSIS — G3281 Cerebellar ataxia in diseases classified elsewhere: Secondary | ICD-10-CM | POA: Diagnosis not present

## 2019-12-12 DIAGNOSIS — B182 Chronic viral hepatitis C: Secondary | ICD-10-CM | POA: Diagnosis present

## 2019-12-12 DIAGNOSIS — R911 Solitary pulmonary nodule: Secondary | ICD-10-CM

## 2019-12-15 ENCOUNTER — Ambulatory Visit (INDEPENDENT_AMBULATORY_CARE_PROVIDER_SITE_OTHER): Payer: Medicare Other | Admitting: Physician Assistant

## 2019-12-15 ENCOUNTER — Encounter: Payer: Self-pay | Admitting: Physician Assistant

## 2019-12-15 ENCOUNTER — Other Ambulatory Visit (INDEPENDENT_AMBULATORY_CARE_PROVIDER_SITE_OTHER): Payer: Medicare Other

## 2019-12-15 VITALS — BP 128/74 | HR 80 | Temp 98.7°F | Ht 66.0 in | Wt 137.0 lb

## 2019-12-15 DIAGNOSIS — Z01818 Encounter for other preprocedural examination: Secondary | ICD-10-CM

## 2019-12-15 DIAGNOSIS — K7031 Alcoholic cirrhosis of liver with ascites: Secondary | ICD-10-CM | POA: Diagnosis not present

## 2019-12-15 LAB — COMPREHENSIVE METABOLIC PANEL
ALT: 36 U/L — ABNORMAL HIGH (ref 0–35)
AST: 50 U/L — ABNORMAL HIGH (ref 0–37)
Albumin: 3.1 g/dL — ABNORMAL LOW (ref 3.5–5.2)
Alkaline Phosphatase: 143 U/L — ABNORMAL HIGH (ref 39–117)
BUN: 12 mg/dL (ref 6–23)
CO2: 28 mEq/L (ref 19–32)
Calcium: 8.4 mg/dL (ref 8.4–10.5)
Chloride: 106 mEq/L (ref 96–112)
Creatinine, Ser: 0.61 mg/dL (ref 0.40–1.20)
GFR: 98.74 mL/min (ref >60.00)
Glucose, Bld: 86 mg/dL (ref 70–99)
Potassium: 3.9 mEq/L (ref 3.5–5.1)
Sodium: 138 mEq/L (ref 135–145)
Total Bilirubin: 0.8 mg/dL (ref 0.2–1.2)
Total Protein: 7.2 g/dL (ref 6.0–8.3)

## 2019-12-15 LAB — CBC WITH DIFFERENTIAL/PLATELET
Basophils Absolute: 0 10*3/uL (ref 0.0–0.1)
Basophils Relative: 0.9 % (ref 0.0–3.0)
Eosinophils Absolute: 0.1 10*3/uL (ref 0.0–0.7)
Eosinophils Relative: 3.1 % (ref 0.0–5.0)
HCT: 34.2 % — ABNORMAL LOW (ref 36.0–46.0)
Hemoglobin: 11.2 g/dL — ABNORMAL LOW (ref 12.0–15.0)
Lymphocytes Relative: 49.1 % — ABNORMAL HIGH (ref 12.0–46.0)
Lymphs Abs: 1.3 10*3/uL (ref 0.7–4.0)
MCHC: 32.9 g/dL (ref 30.0–36.0)
MCV: 93 fl (ref 78.0–100.0)
Monocytes Absolute: 0.2 10*3/uL (ref 0.1–1.0)
Monocytes Relative: 6.8 % (ref 3.0–12.0)
Neutro Abs: 1 10*3/uL — ABNORMAL LOW (ref 1.4–7.7)
Neutrophils Relative %: 40.1 % — ABNORMAL LOW (ref 43.0–77.0)
Platelets: 103 10*3/uL — ABNORMAL LOW (ref 150.0–400.0)
RBC: 3.67 Mil/uL — ABNORMAL LOW (ref 3.87–5.11)
RDW: 14.4 % (ref 11.5–15.5)
WBC: 2.6 10*3/uL — ABNORMAL LOW (ref 4.0–10.5)

## 2019-12-15 LAB — IBC + FERRITIN
Ferritin: 18.7 ng/mL (ref 10.0–291.0)
Iron: 22 ug/dL — ABNORMAL LOW (ref 42–145)
Saturation Ratios: 5.7 % — ABNORMAL LOW (ref 20.0–50.0)
Transferrin: 278 mg/dL (ref 212.0–360.0)

## 2019-12-15 LAB — PROTIME-INR
INR: 1.3 ratio — ABNORMAL HIGH (ref 0.8–1.0)
Prothrombin Time: 14.2 s — ABNORMAL HIGH (ref 9.6–13.1)

## 2019-12-15 NOTE — Patient Instructions (Signed)
If you are age 64 or older, your body mass index should be between 23-30. Your Body mass index is 22.11 kg/m. If this is out of the aforementioned range listed, please consider follow up with your Primary Care Provider.  If you are age 16 or younger, your body mass index should be between 19-25. Your Body mass index is 22.11 kg/m. If this is out of the aformentioned range listed, please consider follow up with your Primary Care Provider.   Your provider has requested that you go to the basement level for lab work before leaving today. Press "B" on the elevator. The lab is located at the first door on the left as you exit the elevator.  You have been scheduled for an endoscopy. Please follow written instructions given to you at your visit today. If you use inhalers (even only as needed), please bring them with you on the day of your procedure.

## 2019-12-15 NOTE — Progress Notes (Signed)
Chief Complaint: Cirrhosis  HPI:    Kelly Singh is a 64 year old Caucasian female with a past medical history as listed below including hepatitis C, who was referred to me by Golden Circle, FNP for a complaint of cirrhosis.      11/26/2019 CMP with AST 50, ALT 46, albumin 2.3, total bilirubin 1.5, alk phos normal.  CBC with platelets low at 114.    12/05/2019 patient saw infectious disease for her chronic hepatitis C.  She was noted to have a fibrosis score of F4 with elevated APR 1 and AFIB for scores concerning for cirrhosis.    12/12/2019 ultrasound of the abdomen with nodular hepatic surface contour in keeping with provided history of cirrhosis.  No focal hepatic lesion.  Small volume ascites.    Today, the patient presents clinic and explains that she is not sure how she got the hepatitis C and definitely was not aware of any previous diagnosis of cirrhosis.  Explains that she has no real GI complaints.    Does have history of a tattoo years ago and tells me that she was "wild in my youth", alcohol abuse as listed in her history though she denies to me today, no history of IV drug use, blood transfusions or family history of liver problems.    Reports a colonoscopy 9 years ago which was completely clean in [place].    Denies fever, chills, weight loss, jaundice, abdominal pain or change in bowel habits.  Past Medical History:  Diagnosis Date  . Ataxia   . Depression with anxiety   . Fibromyalgia   . Migraine     Past Surgical History:  Procedure Laterality Date  . ANKLE SURGERY Right 2011  . BREAST ENHANCEMENT SURGERY  1996  . TUBAL LIGATION  1979  . WRIST SURGERY Right 2016    Current Outpatient Medications  Medication Sig Dispense Refill  . baclofen (LIORESAL) 20 MG tablet Take 20 mg by mouth 3 (three) times daily.    Marland Kitchen HYDROcodone-acetaminophen (NORCO) 10-325 MG tablet Take 1 tablet by mouth every 4 (four) hours as needed (pain).    . Multiple Vitamin (MULTIVITAMIN WITH  MINERALS) TABS tablet Take 1 tablet by mouth daily.    Marland Kitchen OVER THE COUNTER MEDICATION Take 1 tablet by mouth daily. Keto    . sertraline (ZOLOFT) 50 MG tablet Take 50 mg by mouth every morning.    Marland Kitchen Specialty Vitamins Products (BIOTIN PLUS KERATIN PO) Take 1 tablet by mouth daily.    . Vitamin D, Cholecalciferol, 25 MCG (1000 UT) TABS Take by mouth.     No current facility-administered medications for this visit.    Allergies as of 12/15/2019 - Review Complete 12/15/2019  Allergen Reaction Noted  . Adhesive [tape] Other (See Comments) 08/06/2019  . Latex Other (See Comments) 08/06/2019    Family History  Problem Relation Age of Onset  . Bone cancer Mother   . Esophageal cancer Father     Social History   Socioeconomic History  . Marital status: Single    Spouse name: Not on file  . Number of children: 1  . Years of education: college  . Highest education level: Associate degree: occupational, Hotel manager, or vocational program  Occupational History  . Occupation: prep food  Tobacco Use  . Smoking status: Former Smoker    Quit date: 1998    Years since quitting: 23.2  . Smokeless tobacco: Never Used  Substance and Sexual Activity  . Alcohol use: Yes  Comment: occasional  . Drug use: Never  . Sexual activity: Not on file  Other Topics Concern  . Not on file  Social History Narrative   Lives at home with her granddaughter.   Left-handed.   Caffeine use:c 1-2 cups per day.   Social Determinants of Health   Financial Resource Strain:   . Difficulty of Paying Living Expenses:   Food Insecurity:   . Worried About Charity fundraiser in the Last Year:   . Arboriculturist in the Last Year:   Transportation Needs:   . Film/video editor (Medical):   Marland Kitchen Lack of Transportation (Non-Medical):   Physical Activity:   . Days of Exercise per Week:   . Minutes of Exercise per Session:   Stress:   . Feeling of Stress :   Social Connections:   . Frequency of  Communication with Friends and Family:   . Frequency of Social Gatherings with Friends and Family:   . Attends Religious Services:   . Active Member of Clubs or Organizations:   . Attends Archivist Meetings:   Marland Kitchen Marital Status:   Intimate Partner Violence:   . Fear of Current or Ex-Partner:   . Emotionally Abused:   Marland Kitchen Physically Abused:   . Sexually Abused:     Review of Systems:    Constitutional: No weight loss, fever or chills Skin: No rash Cardiovascular: No chest pain Respiratory: No SOB  Gastrointestinal: See HPI and otherwise negative Genitourinary: No dysuria  Neurological: No headache, dizziness or syncope Musculoskeletal: No new muscle or joint pain Hematologic: No bleeding  Psychiatric: No history of depression or anxiety   Physical Exam:  Vital signs: BP 128/74 (BP Location: Left Arm, Patient Position: Sitting)   Pulse 80   Temp 98.7 F (37.1 C)   Ht 5' 6"  (1.676 m)   Wt 137 lb (62.1 kg)   SpO2 98%   BMI 22.11 kg/m   Constitutional:   Pleasant Caucasian female appears to be in NAD, Well developed, Well nourished, alert and cooperative Head:  Normocephalic and atraumatic. Eyes:   PEERL, EOMI. No icterus. Conjunctiva pink. Ears:  Normal auditory acuity. Neck:  Supple Throat: Oral cavity and pharynx without inflammation, swelling or lesion.  Respiratory: Respirations even and unlabored. Lungs clear to auscultation bilaterally.   No wheezes, crackles, or rhonchi.  Cardiovascular: Normal S1, S2. No MRG. Regular rate and rhythm. No peripheral edema, cyanosis or pallor.  Gastrointestinal:  Soft, nondistended, nontender. No rebound or guarding. Normal bowel sounds. No appreciable masses or hepatomegaly. Rectal:  Not performed.  Msk:  Symmetrical without gross deformities. Without edema, no deformity or joint abnormality.  Neurologic:  Alert and  oriented x4;  grossly normal neurologically. Ataxia Skin:   Dry and intact without significant lesions or  rashes. Psychiatric: Demonstrates good judgement and reason without abnormal affect or behaviors.  MOST RECENT LABS AND IMAGING: CBC    Component Value Date/Time   WBC 6.6 11/09/2019 1217   RBC 2.81 (L) 11/09/2019 1217   HGB 9.0 (L) 11/09/2019 1217   HGB 14.7 10/13/2019 1200   HCT 28.6 (L) 11/09/2019 1217   PLT 114 (L) 11/09/2019 1217   PLT 99 (L) 10/13/2019 1200   MCV 101.8 (H) 11/09/2019 1217   MCH 32.0 11/09/2019 1217   MCHC 31.5 11/09/2019 1217   RDW 14.1 11/09/2019 1217   LYMPHSABS 3.0 11/08/2019 2230   MONOABS 0.5 11/08/2019 2230   EOSABS 0.1 11/08/2019 2230   BASOSABS  0.1 11/08/2019 2230    CMP     Component Value Date/Time   NA 137 11/09/2019 1217   K 4.2 11/09/2019 1217   CL 110 11/09/2019 1217   CO2 24 11/09/2019 1217   GLUCOSE 114 (H) 11/09/2019 1217   BUN 8 11/09/2019 1217   CREATININE 0.68 11/09/2019 1217   CREATININE 0.73 10/13/2019 1200   CALCIUM 7.5 (L) 11/09/2019 1217   PROT 5.1 (L) 11/09/2019 1217   ALBUMIN 2.3 (L) 11/09/2019 1217   AST 50 (H) 11/09/2019 1217   AST 68 (H) 10/13/2019 1200   ALT 46 (H) 11/09/2019 1217   ALT 63 (H) 11/01/2019 0955   ALKPHOS 82 11/09/2019 1217   BILITOT 1.5 (H) 11/09/2019 1217   BILITOT 2.1 (H) 10/13/2019 1200   GFRNONAA >60 11/09/2019 1217   GFRNONAA >60 10/13/2019 1200   GFRAA >60 11/09/2019 1217   GFRAA >60 10/13/2019 1200    Assessment: 1.  Cirrhosis: History of hepatitis C which is likely the cause +/- history of alcohol abuse in the past per her chart, recent ultrasound with nodular appearance of liver but no focal abnormality  Plan: 1.  Scheduled patient for variceal screening with an EGD in the [place] with Dr. Hilarie Fredrickson.  Did discuss risks, benefits, limitations and alternatives and the patient agrees to proceed.  Patient will be Covid tested 2 days prior to time of procedure. 2.  Ordered labs today to include further work-up for cirrhosis.  Ordered CBC, CMP, PT/INR, iron studies with ferritin, AFP, ANA, ASMA,  AMA, ANA, ceruloplasmin and hepatitis serologies. 3.  Requesting records from [place] in regards to patient's last colonoscopy.  If she truly had no polyps then she would not be due until next year.  Will review. 4.  Patient to follow in clinic per recommendations after procedure and lab work-up as above.  Explained that she will need screening for [facility] every 6 months and a repeat EGD every 2 years.  Ellouise Newer, PA-C [facility] 12/15/2019, 4:08 PM  Cc: Golden Circle, FNP

## 2019-12-16 ENCOUNTER — Telehealth: Payer: Self-pay

## 2019-12-16 ENCOUNTER — Ambulatory Visit: Payer: Medicare Other | Admitting: Physician Assistant

## 2019-12-16 NOTE — Telephone Encounter (Signed)
See result note.  

## 2019-12-16 NOTE — Telephone Encounter (Signed)
3/12 - lmtcb Hazle Nordmann

## 2019-12-16 NOTE — Telephone Encounter (Signed)
Patient is returning your call she said you can leave a detailed message.

## 2019-12-21 LAB — MITOCHONDRIAL ANTIBODIES: Mitochondrial M2 Ab, IgG: <=20 U

## 2019-12-21 LAB — ANA: Anti Nuclear Antibody (ANA): POSITIVE — AB

## 2019-12-21 LAB — AFP TUMOR MARKER: AFP-Tumor Marker: 15.2 ng/mL — ABNORMAL HIGH

## 2019-12-21 LAB — HEPATITIS A ANTIBODY, TOTAL: Hepatitis A AB,Total: NONREACTIVE

## 2019-12-21 LAB — ANTI-SMOOTH MUSCLE ANTIBODY, IGG: Actin (Smooth Muscle) Antibody (IGG): 30 U — ABNORMAL HIGH (ref <20)

## 2019-12-21 LAB — HEPATITIS B SURFACE ANTIBODY,QUALITATIVE: Hep B S Ab: NONREACTIVE

## 2019-12-21 LAB — ANTI-NUCLEAR AB-TITER (ANA TITER): ANA Titer 1: 1:80 {titer} — ABNORMAL HIGH

## 2019-12-21 LAB — CERULOPLASMIN: Ceruloplasmin: 24 mg/dL (ref 18–53)

## 2019-12-21 LAB — HEPATITIS B SURFACE ANTIGEN: Hepatitis B Surface Ag: NONREACTIVE

## 2019-12-21 LAB — ALPHA-1-ANTITRYPSIN: A-1 Antitrypsin, Ser: 135 mg/dL (ref 83–199)

## 2019-12-21 NOTE — Progress Notes (Signed)
Addendum: Reviewed and agree with assessment and management plan. Antwione Picotte M, MD  

## 2019-12-22 ENCOUNTER — Other Ambulatory Visit: Payer: Self-pay

## 2019-12-22 ENCOUNTER — Telehealth: Payer: Self-pay

## 2019-12-22 DIAGNOSIS — K7469 Other cirrhosis of liver: Secondary | ICD-10-CM

## 2019-12-22 NOTE — Telephone Encounter (Signed)
Left message to please call back. °

## 2019-12-22 NOTE — Telephone Encounter (Signed)
patient returning your call °

## 2019-12-23 NOTE — Telephone Encounter (Signed)
Called patient and left message that I am trying to schedule a MRI-liver for her

## 2019-12-23 NOTE — Telephone Encounter (Signed)
Called patient and got voice mail. Left message to please call back again

## 2019-12-23 NOTE — Telephone Encounter (Signed)
Pt returned your call.  She stated "to just leave a message."

## 2020-01-04 ENCOUNTER — Ambulatory Visit (HOSPITAL_COMMUNITY)
Admission: RE | Admit: 2020-01-04 | Discharge: 2020-01-04 | Disposition: A | Discharge status: Home / Self Care | Payer: Medicare Other | Source: Ambulatory Visit | Attending: Internal Medicine | Admitting: Internal Medicine

## 2020-01-04 ENCOUNTER — Other Ambulatory Visit: Payer: Self-pay

## 2020-01-04 DIAGNOSIS — K7469 Other cirrhosis of liver: Secondary | ICD-10-CM | POA: Diagnosis not present

## 2020-01-04 MED ORDER — GADOBUTROL 1 MMOL/ML IV SOLN
6.5000 mL | Freq: Once | INTRAVENOUS | Status: AC | PRN
  Administered 2020-01-04: 6.5 mL via INTRAVENOUS

## 2020-01-09 ENCOUNTER — Other Ambulatory Visit: Payer: Self-pay

## 2020-01-09 DIAGNOSIS — K7469 Other cirrhosis of liver: Secondary | ICD-10-CM

## 2020-01-11 ENCOUNTER — Inpatient Hospital Stay: Payer: Medicare HMO | Attending: Oncology

## 2020-01-11 ENCOUNTER — Other Ambulatory Visit: Payer: Self-pay

## 2020-01-11 DIAGNOSIS — K7460 Unspecified cirrhosis of liver: Secondary | ICD-10-CM | POA: Diagnosis not present

## 2020-01-11 DIAGNOSIS — K7689 Other specified diseases of liver: Secondary | ICD-10-CM | POA: Diagnosis not present

## 2020-01-11 DIAGNOSIS — D472 Monoclonal gammopathy: Secondary | ICD-10-CM | POA: Diagnosis present

## 2020-01-11 DIAGNOSIS — K766 Portal hypertension: Secondary | ICD-10-CM | POA: Insufficient documentation

## 2020-01-11 DIAGNOSIS — R269 Unspecified abnormalities of gait and mobility: Secondary | ICD-10-CM | POA: Diagnosis not present

## 2020-01-11 DIAGNOSIS — D6959 Other secondary thrombocytopenia: Secondary | ICD-10-CM | POA: Insufficient documentation

## 2020-01-11 DIAGNOSIS — R911 Solitary pulmonary nodule: Secondary | ICD-10-CM

## 2020-01-11 DIAGNOSIS — R270 Ataxia, unspecified: Secondary | ICD-10-CM | POA: Diagnosis not present

## 2020-01-11 LAB — CMP (CANCER CENTER ONLY)
ALT: 37 U/L (ref 0–44)
AST: 49 U/L — ABNORMAL HIGH (ref 15–41)
Albumin: 2.7 g/dL — ABNORMAL LOW (ref 3.5–5.0)
Alkaline Phosphatase: 138 U/L — ABNORMAL HIGH (ref 38–126)
Anion gap: 4 — ABNORMAL LOW (ref 5–15)
BUN: 7 mg/dL — ABNORMAL LOW (ref 8–23)
CO2: 27 mmol/L (ref 22–32)
Calcium: 8 mg/dL — ABNORMAL LOW (ref 8.9–10.3)
Chloride: 110 mmol/L (ref 98–111)
Creatinine: 0.7 mg/dL (ref 0.44–1.00)
GFR, Est AFR Am: >60 mL/min (ref >60)
GFR, Estimated: >60 mL/min (ref >60)
Glucose, Bld: 106 mg/dL — ABNORMAL HIGH (ref 70–99)
Potassium: 4.3 mmol/L (ref 3.5–5.1)
Sodium: 141 mmol/L (ref 135–145)
Total Bilirubin: 1 mg/dL (ref 0.3–1.2)
Total Protein: 6.6 g/dL (ref 6.5–8.1)

## 2020-01-11 LAB — CBC WITH DIFFERENTIAL (CANCER CENTER ONLY)
Abs Immature Granulocytes: 0 10*3/uL (ref 0.00–0.07)
Basophils Absolute: 0 10*3/uL (ref 0.0–0.1)
Basophils Relative: 1 %
Eosinophils Absolute: 0.1 10*3/uL (ref 0.0–0.5)
Eosinophils Relative: 4 %
HCT: 37.8 % (ref 36.0–46.0)
Hemoglobin: 11.8 g/dL — ABNORMAL LOW (ref 12.0–15.0)
Immature Granulocytes: 0 %
Lymphocytes Relative: 45 %
Lymphs Abs: 1.1 10*3/uL (ref 0.7–4.0)
MCH: 29.4 pg (ref 26.0–34.0)
MCHC: 31.2 g/dL (ref 30.0–36.0)
MCV: 94.3 fL (ref 80.0–100.0)
Monocytes Absolute: 0.2 10*3/uL (ref 0.1–1.0)
Monocytes Relative: 9 %
Neutro Abs: 1 10*3/uL — ABNORMAL LOW (ref 1.7–7.7)
Neutrophils Relative %: 41 %
Platelet Count: 97 10*3/uL — ABNORMAL LOW (ref 150–400)
RBC: 4.01 MIL/uL (ref 3.87–5.11)
RDW: 15.3 % (ref 11.5–15.5)
WBC Count: 2.3 10*3/uL — ABNORMAL LOW (ref 4.0–10.5)
nRBC: 0 % (ref 0.0–0.2)

## 2020-01-12 LAB — KAPPA/LAMBDA LIGHT CHAINS
Kappa free light chain: 34.4 mg/L — ABNORMAL HIGH (ref 3.3–19.4)
Kappa, lambda light chain ratio: 1.3 (ref 0.26–1.65)
Lambda free light chains: 26.4 mg/L — ABNORMAL HIGH (ref 5.7–26.3)

## 2020-01-16 ENCOUNTER — Other Ambulatory Visit: Payer: Self-pay | Admitting: Internal Medicine

## 2020-01-16 ENCOUNTER — Other Ambulatory Visit: Payer: Self-pay

## 2020-01-16 ENCOUNTER — Ambulatory Visit (INDEPENDENT_AMBULATORY_CARE_PROVIDER_SITE_OTHER): Payer: Medicare HMO

## 2020-01-16 DIAGNOSIS — Z1159 Encounter for screening for other viral diseases: Secondary | ICD-10-CM

## 2020-01-16 LAB — MULTIPLE MYELOMA PANEL, SERUM
Albumin SerPl Elph-Mcnc: 2.9 g/dL (ref 2.9–4.4)
Albumin/Glob SerPl: 0.8 (ref 0.7–1.7)
Alpha 1: 0.2 g/dL (ref 0.0–0.4)
Alpha2 Glob SerPl Elph-Mcnc: 0.4 g/dL (ref 0.4–1.0)
B-Globulin SerPl Elph-Mcnc: 0.6 g/dL — ABNORMAL LOW (ref 0.7–1.3)
Gamma Glob SerPl Elph-Mcnc: 2.6 g/dL — ABNORMAL HIGH (ref 0.4–1.8)
Globulin, Total: 3.8 g/dL (ref 2.2–3.9)
IgA: 191 mg/dL (ref 87–352)
IgG (Immunoglobin G), Serum: 2381 mg/dL — ABNORMAL HIGH (ref 586–1602)
IgM (Immunoglobulin M), Srm: 150 mg/dL (ref 26–217)
M Protein SerPl Elph-Mcnc: 1.2 g/dL — ABNORMAL HIGH
Total Protein ELP: 6.7 g/dL (ref 6.0–8.5)

## 2020-01-17 LAB — SARS CORONAVIRUS 2 (TAT 6-24 HRS): SARS Coronavirus 2: NEGATIVE

## 2020-01-18 ENCOUNTER — Ambulatory Visit (AMBULATORY_SURGERY_CENTER): Payer: Medicare HMO | Admitting: Internal Medicine

## 2020-01-18 ENCOUNTER — Encounter: Payer: Self-pay | Admitting: Internal Medicine

## 2020-01-18 ENCOUNTER — Other Ambulatory Visit: Payer: Self-pay

## 2020-01-18 ENCOUNTER — Inpatient Hospital Stay (HOSPITAL_BASED_OUTPATIENT_CLINIC_OR_DEPARTMENT_OTHER): Payer: Medicare HMO | Admitting: Oncology

## 2020-01-18 VITALS — BP 143/75 | HR 73 | Temp 98.7°F | Resp 17 | Ht 66.0 in | Wt 136.4 lb

## 2020-01-18 VITALS — BP 131/77 | HR 68 | Temp 97.5°F | Resp 13 | Ht 66.0 in | Wt 137.0 lb

## 2020-01-18 DIAGNOSIS — K7460 Unspecified cirrhosis of liver: Secondary | ICD-10-CM | POA: Diagnosis not present

## 2020-01-18 DIAGNOSIS — D472 Monoclonal gammopathy: Secondary | ICD-10-CM | POA: Diagnosis not present

## 2020-01-18 MED ORDER — SODIUM CHLORIDE 0.9 % IV SOLN: 500.0000 mL | Freq: Once | INTRAVENOUS | Status: DC

## 2020-01-18 NOTE — Progress Notes (Signed)
Report to PACU, RN, vss, BBS= Clear.  

## 2020-01-18 NOTE — Patient Instructions (Signed)
Thank you for letting us take care of your healthcare needs today.    YOU HAD AN ENDOSCOPIC PROCEDURE TODAY AT [facility]:   Refer to the procedure report that was given to you for any specific questions about what was found during the examination.  If the procedure report does not answer your questions, please call your gastroenterologist to clarify.  If you requested that your care partner not be given the details of your procedure findings, then the procedure report has been included in a sealed envelope for you to review at your convenience later.  YOU SHOULD EXPECT: Some feelings of bloating in the abdomen. Passage of more gas than usual.  Walking can help get rid of the air that was put into your GI tract during the procedure and reduce the bloating. If you had a lower endoscopy (such as a colonoscopy or flexible sigmoidoscopy) you may notice spotting of blood in your stool or on the toilet paper. If you underwent a bowel prep for your procedure, you may not have a normal bowel movement for a few days.  Please Note:  You might notice some irritation and congestion in your nose or some drainage.  This is from the oxygen used during your procedure.  There is no need for concern and it should clear up in a day or so.  SYMPTOMS TO REPORT IMMEDIATELY:    Following upper endoscopy (EGD)  Vomiting of blood or coffee ground material  New chest pain or pain under the shoulder blades  Painful or persistently difficult swallowing  New shortness of breath  Fever of 100F or higher  Black, tarry-looking stools  For urgent or emergent issues, a gastroenterologist can be reached at any hour by calling [phone]. Do not use MyChart messaging for urgent concerns.    DIET:  We do recommend a small meal at first, but then you may proceed to your regular diet.  Drink plenty of fluids but you should avoid alcoholic beverages for 24 hours.  ACTIVITY:  You should plan to take it  easy for the rest of today and you should NOT DRIVE or use heavy machinery until tomorrow (because of the sedation medicines used during the test).    FOLLOW UP: Our staff will call the number listed on your records 48-72 hours following your procedure to check on you and address any questions or concerns that you may have regarding the information given to you following your procedure. If we do not reach you, we will leave a message.  We will attempt to reach you two times.  During this call, we will ask if you have developed any symptoms of COVID 19. If you develop any symptoms (ie: fever, flu-like symptoms, shortness of breath, cough etc.) before then, please call [phone].  If you test positive for Covid 19 in the 2 weeks post procedure, please call and report this information to [place].    If any biopsies were taken you will be contacted by phone or by letter within the next 1-3 weeks.  Please call us at [phone] if you have not heard about the biopsies in 3 weeks.    SIGNATURES/CONFIDENTIALITY: You and/or your care partner have signed paperwork which will be entered into your electronic medical record.  These signatures attest to the fact that that the information above on your After Visit Summary has been reviewed and is understood.  Full responsibility of the confidentiality of this discharge information lies with you and/or  your care-partner. 

## 2020-01-18 NOTE — Op Note (Signed)
[facility] Patient Name: Kelly Singh Procedure Date: 01/18/2020 4:08 PM MRN: [id number] Endoscopist: Jerene Bears , MD Age: 64 Referring MD:  Date of Birth: 1956/04/04 Gender: Female Account #: [id number] Procedure:                Upper GI endoscopy Indications:              Cirrhosis, screening for esophageal varices Medicines:                Monitored Anesthesia Care Procedure:                Pre-Anesthesia Assessment:                           - Prior to the procedure, a History and Physical                            was performed, and patient medications and                            allergies were reviewed. The patient's tolerance of                            previous anesthesia was also reviewed. The risks                            and benefits of the procedure and the sedation                            options and risks were discussed with the patient.                            All questions were answered, and informed consent                            was obtained. Prior Anticoagulants: The patient has                            taken no previous anticoagulant or antiplatelet                            agents. ASA Grade Assessment: III - A patient with                            severe systemic disease. After reviewing the risks                            and benefits, the patient was deemed in                            satisfactory condition to undergo the procedure.                           After obtaining informed consent, the endoscope was  passed under direct vision. Throughout the                            procedure, the patient's blood pressure, pulse, and                            oxygen saturations were monitored continuously. The                            Endoscope was introduced through the mouth, and                            advanced to the second part of duodenum. The upper                            GI  endoscopy was accomplished without difficulty.                            The patient tolerated the procedure well. Scope In: Scope Out: Findings:                 The esophagus was normal.                           The stomach was normal.                           The examined duodenum was normal. Complications:            No immediate complications. Estimated Blood Loss:     Estimated blood loss: none. Impression:               - Normal esophagus.                           - Normal stomach.                           - Normal examined duodenum.                           - No specimens collected. Recommendation:           - Patient has a contact number available for                            emergencies. The signs and symptoms of potential                            delayed complications were discussed with the                            patient. Return to normal activities tomorrow.                            Written discharge instructions were provided to the  patient.                           - Resume previous diet.                           - Continue present medications.                           - Office follow-up with me in June 2021.                           - Repeat upper endoscopy in 2 years for variceal                            screening purposes. Jerene Bears, MD 01/18/2020 4:26:05 PM This report has been signed electronically.

## 2020-01-18 NOTE — Progress Notes (Signed)
Hematology and Oncology Follow Up Visit  Kelly Singh [id number] November 08, 1955 64 y.o. 01/18/2020 1:38 PM Kelly Singh, MDLamb, Kelly Bulls, MD   Principle Diagnosis: 64 year old woman with biclonal MGUS diagnosed in 2011.  She was found to have IgG with elevation of both kappa and lambda with normal ratio.   Secondary diagnosis: Thrombocytopenia related to cirrhosis of the liver.   Current therapy: Active surveillance.  Interim History: Ms. Kelly Singh returns today for a follow-up visit.  Since the last visit, she was hospitalized briefly in February 2021 after presenting with a fall.  Since that time, she reports of feeling well without any complaints.  She continues to have issues with gait and ataxia which is chronic and unchanged at this time.  She continues to attempt activities of daily living without any decline.  Denies any bone pain or pathological fractures.  She is able to drive and 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[employer].      Medications: I have reviewed the patient's current medications.  Current Outpatient Medications  Medication Sig Dispense Refill  . baclofen (LIORESAL) 20 MG tablet Take 20 mg by mouth 3 (three) times daily.    Marland Kitchen HYDROcodone-acetaminophen (NORCO) 10-325 MG tablet Take 1 tablet by mouth every 4 (four) hours as needed (pain).    . Multiple Vitamin (MULTIVITAMIN WITH MINERALS) TABS tablet Take 1 tablet by mouth daily.    Marland Kitchen OVER THE COUNTER MEDICATION Take 1 tablet by mouth daily. Keto    . sertraline (ZOLOFT) 50 MG tablet Take 50 mg by mouth every morning.    Marland Kitchen Specialty Vitamins Products (BIOTIN PLUS KERATIN PO) Take 1 tablet by mouth daily.    . Vitamin D, Cholecalciferol, 25 MCG (1000 UT) TABS Take by mouth.     No current facility-administered medications for this visit.     Allergies:  Allergies  Allergen Reactions  . Adhesive [Tape] Other (See Comments)    Plastic tape eats through skin - pls use paper tape  . Latex Other (See Comments)    Eats through  skin     Physical Exam: Blood pressure (!) 143/75, pulse 73, temperature 98.7 F (37.1 C), temperature source Temporal, resp. rate 17, height 5' 6"  (1.676 m), weight 136 lb 6.4 oz (61.9 kg), SpO2 98 %.   ECOG: 1   General appearance: Comfortable appearing without any discomfort Head: Normocephalic without any trauma Oropharynx: Mucous membranes are moist and pink without any thrush or ulcers. Eyes: Pupils are equal and round reactive to light. Lymph nodes: No cervical, supraclavicular, inguinal or axillary lymphadenopathy.   Heart:regular rate and rhythm.  S1 and S2 without leg edema. Lung: Clear without any rhonchi or wheezes.  No dullness to percussion. Abdomin: Soft, nontender, nondistended with good bowel sounds.  No hepatosplenomegaly. Musculoskeletal: No joint deformity or effusion.  Full range of motion noted. Neurological: No deficits noted on motor, sensory and deep tendon reflex exam. Skin: No petechial rash or dryness.  Appeared moist.      Lab Results: Lab Results  Component Value Date   WBC 2.3 (L) 01/11/2020   HGB 11.8 (L) 01/11/2020   HCT 37.8 01/11/2020   MCV 94.3 01/11/2020   PLT 97 (L) 01/11/2020     Chemistry      Component Value Date/Time   NA 141 01/11/2020 0815   K 4.3 01/11/2020 0815   CL 110 01/11/2020 0815   CO2 27 01/11/2020 0815   BUN 7 (L) 01/11/2020 0815   CREATININE 0.70 01/11/2020 0815  Component Value Date/Time   CALCIUM 8.0 (L) 01/11/2020 0815   ALKPHOS 138 (H) 01/11/2020 0815   AST 49 (H) 01/11/2020 0815   ALT 37 01/11/2020 0815   ALT 63 (H) 11/01/2019 0955   BILITOT 1.0 01/11/2020 0815     Results for Singh, Kelly "PAT" (MRN [id number]) as of 01/18/2020 13:04  Ref. Range 10/13/2019 12:00 01/11/2020 08:15  M Protein SerPl Elph-Mcnc Latest Ref Range: Not Observed g/dL 1.0 (H) 1.2 (H)  IFE 1 Unknown Comment (A) Comment (A)  Globulin, Total Latest Ref Range: 2.2 - 3.9 g/dL 3.8 3.8  B-Globulin SerPl Elph-Mcnc Latest Ref  Range: 0.7 - 1.3 g/dL 0.6 (L) 0.6 (L)  IgG (Immunoglobin G), Serum Latest Ref Range: 586 - 1,602 mg/dL 2,459 (H) 2,381 (H)  IgM (Immunoglobulin M), Srm Latest Ref Range: 26 - 217 mg/dL 166 150  IgA Latest Ref Range: 87 - 352 mg/dL 194 191    IMPRESSION: 1. Signs of cirrhosis and portal venous hypertension including portosystemic collaterals and mild splenomegaly. 2. Focal hepatic lesion characterized as LI-RADS category 3. Follow-up imaging at 3-6 months is suggested though this may represent a benign hemangioma, exam confounded by respiratory variation. 3. Mild dilation of the extrahepatic biliary tree is suggested and may be segmental. If there is clinical concern for autoimmune hepatitis correlation with alkaline phosphatase may be helpful with follow-up imaging to include MRCP given the possibility of autoimmune/PSC overlap. GI consultation may be useful if not yet performed. 4. Sludge in the gallbladder.  Impression and Plan:  63 year old woman with:  1.  Biclonal IgG MGUS with kappa and lambda elevation and normal ratio diagnosed in 2011.  He is currently on active surveillance after relocating from [place].  Laboratory data obtained on January 11, 2020 showed an M spike of around 1 g/dL and a declining IgG level.  Her kappa and lambda are mildly elevated with a normal ratio.  The natural course of this disease was reviewed at this time and given the overall low M spike and relative stability, I do not see any evidence of symptomatic multiple myeloma at this time.  I have recommended annual surveillance at this time.  We will repeat protein studies in 9 months.   2.  Thrombocytopenia: Related to splenic sequestration from portal hypertension and cirrhosis of the liver.  A platelet count on April 7 was 97 without any active bleeding.  3.  Focal hepatic lesion detected on MRI on in March 2021.  Unclear if it is malignant versus a hemangioma.  Repeat imaging studies will be done in  the future.  She is followed by GI regarding this issue.   4.  Lung masses: CT scan on December 12, 2019 was reviewed and showed no evidence to suggest malignancy.  5. Follow-up: In 9 months and 12 months after that.  30  minutes were dedicated to this visit. The time was spent on reviewing her disease status, laboratory data, imaging studies, and organizing her future plan of care.   Zola Button, MD 4/14/20211:38 PM

## 2020-01-19 ENCOUNTER — Telehealth: Payer: Self-pay | Admitting: Oncology

## 2020-01-19 NOTE — Telephone Encounter (Signed)
Scheduled appt per 4/14 los.  Spoke with pt and she is aware of her appt date and time.

## 2020-01-20 ENCOUNTER — Telehealth: Payer: Self-pay

## 2020-01-20 NOTE — Telephone Encounter (Signed)
  Follow up Call-  Call back number 01/18/2020  Post procedure Call Back phone  # [phone]  Permission to leave phone message Yes     Left message

## 2020-01-20 NOTE — Telephone Encounter (Signed)
  Follow up Call-  Call back number 01/18/2020  Post procedure Call Back phone  # [phone]  Permission to leave phone message Yes     Patient questions:  Do you have a fever, pain , or abdominal swelling? No. Pain Score  0 *  Have you tolerated food without any problems? Yes.    Have you been able to return to your normal activities? Yes.    Do you have any questions about your discharge instructions: Diet   No. Medications  No. Follow up visit  No.  Do you have questions or concerns about your Care? No.  Actions: * If pain score is 4 or above: No action needed, pain <4.  1. Have you developed a fever since your procedure? No  2.   Have you had an respiratory symptoms (SOB or cough) since your procedure? No  3.   Have you tested positive for COVID 19 since your procedure No  4.   Have you had any family members/close contacts diagnosed with the COVID 19 since your procedure?  No  If yes to any of these questions please route to Joylene John, RN and Erenest Rasher, RN

## 2020-02-03 ENCOUNTER — Other Ambulatory Visit: Payer: Self-pay

## 2020-02-03 DIAGNOSIS — A048 Other specified bacterial intestinal infections: Secondary | ICD-10-CM

## 2020-02-21 ENCOUNTER — Telehealth: Payer: Self-pay | Admitting: Family

## 2020-02-21 MED ORDER — HARVONI 45-200 MG PO TABS: 1.0000 | ORAL_TABLET | Freq: Every day | ORAL | 2 refills | Status: DC

## 2020-02-21 NOTE — Telephone Encounter (Signed)
Kelly Singh is a 64 y/o female with genotype 1a chronic Hepatitis C with initial viral load of 2 million IU/ml and Fibrosis score of F4 with ultrasound showing cirrhosis and no evidence of lesions. She has been seen by GI. Will need to treat for compensated cirrhosis with plans of 12 weeks of Harvoni.

## 2020-02-23 ENCOUNTER — Telehealth: Payer: Self-pay

## 2020-02-23 ENCOUNTER — Other Ambulatory Visit: Payer: Self-pay | Admitting: Pharmacist

## 2020-02-23 DIAGNOSIS — B182 Chronic viral hepatitis C: Secondary | ICD-10-CM

## 2020-02-23 MED ORDER — LEDIPASVIR-SOFOSBUVIR 90-400 MG PO TABS: 1.0000 | ORAL_TABLET | Freq: Every day | ORAL | 5 refills | Status: DC

## 2020-02-23 NOTE — Progress Notes (Signed)
Patient appears to have decompensated cirrhosis based on child pugh score of B. Will resend Harvoni for 6 months. Platelets have been low recently so we want to avoid ribavirin. Discussed with Marya Amsler.

## 2020-02-23 NOTE — Telephone Encounter (Signed)
Patient has sent multiple message through [facility] appointment request regarding medication and follow up appointment.  Informed patient that medication was sent to [facility]. Will request patient to update CMA once she starts medication to schedule one month follow up.  [place]

## 2020-02-27 ENCOUNTER — Telehealth: Payer: Self-pay | Admitting: Pharmacy Technician

## 2020-02-27 NOTE — Telephone Encounter (Signed)
Prior Authorization has been submitted to [facility] for 24 weeks of Harvoni.

## 2020-02-27 NOTE — Telephone Encounter (Addendum)
[facility] Patient Advocate Encounter   Received notification from [facility] that prior authorization for Harvoni is required.   PA submitted on 02/27/2020 [place] Status is pending determination from [facility] will continue to follow.  Bartholomew Crews, CPhT Specialty Pharmacy Patient [facility] for Infectious Disease Phone: [phone] Fax: [phone] 02/27/2020 11:42 AM

## 2020-02-28 ENCOUNTER — Telehealth: Payer: Self-pay | Admitting: Pharmacist

## 2020-02-28 ENCOUNTER — Encounter: Payer: Self-pay | Admitting: Pharmacy Technician

## 2020-02-28 MED FILL — HARVONI 90-400 MG TABLET: 90-400 | 28 days supply | Qty: 28 | Fill #0

## 2020-02-28 NOTE — Telephone Encounter (Signed)
[facility] Patient Advocate Encounter  Prior Authorization for Kelly Singh has been approved.    PA# [id number] Effective dates: 02/27/2020 through 08/13/2020  Patients co-pay is $4.00.   [facility] Clinic will continue to follow.  Bartholomew Crews, CPhT Specialty Pharmacy Patient [facility] for Infectious Disease Phone: [phone] Fax: [phone] 02/28/2020 9:39 AM

## 2020-02-28 NOTE — Telephone Encounter (Cosign Needed)
I spoke with Kelly Singh this afternoon about her new medication Harvoni. She will start Harvoni 1 tablet once daily on 02/29/2020 and will continue for a total of 6 months. Discussed the importance of medication adherence and not missing any doses in order to achieve SVR. Advised her to take Harvoni around the same time everyday and not to take any extra doses (more than 1 tablet). Counseled patient on potential side-effects including fatigue, headache and nausea and advised her to take [place] with food to help with nausea. Explained that if she needs to take antacids/Tums in the future, she will need to separate Harvoni by 4 hours before or after. Also instructed patient to call Cassie if she starts any new medications. Patient verbalized understanding. Patient asked if the COVID vaccine will interact with Harvoni. Explained that she is safe to receive the COVID vaccine while taking Harvoni. All of her questions and concerns were addressed at this time. She will follow up with Cassie on 6/28 at [place] Shyloh Krinke, PharmD PGY1 Ambulatory Care Resident

## 2020-03-02 ENCOUNTER — Encounter: Payer: Self-pay | Admitting: *Deleted

## 2020-03-07 NOTE — Progress Notes (Signed)
PATIENT: Kelly Singh DOB: 06-05-1956  REASON FOR VISIT: follow up HISTORY FROM: patient  HISTORY OF PRESENT ILLNESS: Today 03/08/20  HISTORY  Kelly Singh is a 64 year old female, seen in request by her primary care physician Dr. Apolonio Schneiders for evaluation of speech difficulty, gait abnormality.  Initial evaluation was on November 03, 2019.  I have reviewed and summarized the referring note from the referring physician.  She has past medical history of depression, migraine headache, chronic low back pain  Around 2001, she first noticed problem with walking, she did not previously have any balance problem, was still riding a motorcycle, then began to notice unsteady gait, getting off for path while walking, then began to stumble, and fall, she often tripped on obstacles, in 2000 01, she had a fall, resulting ankle fracture, her balance then progressively worsened, then noticed difficulty even standing still,  Since 2018, she noticed mild slurred speech, denies trouble swallowing, mild trouble with hand coordination, she denies tremor  She previously lived in [place], had extensive evaluation at that time, then moved to [place] for couple years, was evaluated by movement disorder center Dr, Thermon Leyland on November 02, 2017, she was giving the 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, and slow progression of motor symptoms, she was giving a trial of carbidopa-levodopa 25/100 mg to 1 tablet 3 times a day, she developed nausea, there was no significant improvement, she was later referred to physical therapy,  She moved to [facility] since October 2020, is referred here to establish neurological care,  She denied a family history of gait problem,  Laboratory evaluations in January 2021 showed mild elevated alkaline phosphate 146, total bilirubin of 2.1, albumin was decreased 3.3, mild elevated AST 68, ALT 63, calcium was decreased 8.0  Nonreactive  hepatitis B surface antibody, surface antigen, negative hepatitis C, elevated kappa free light chain 29.7, lambda light chain ratio 1.87  Update March 08, 2020 SS: Here today for follow-up unaccompanied.  She completed physical therapy in her home, reports it was really good, and helpful for her walking, learned exercises (pilates, low grade things) she could do at home, she just has to do them.  Had a fall a few days ago, slipped on some water, bruise to right forearm, right hip.  Recently been diagnosed with hepatitis C, just started Harvoni.  She works at [employer], drives a car.  She has lived in [place] since October, moved down here to be closer to her daughter.  She is now wearing a wedge in her left shoe, left leg was found to be slightly shorter.  Has a walking stick, also walker at home, keeps her upright if needed.  Has follow-up with hematology, overall stable, active surveillance. Last few years, noticed her speech has been slurring, gets slower.  REVIEW OF SYSTEMS: Out of a complete 14 system review of symptoms, the patient complains only of the following symptoms, and all other reviewed systems are negative.  Walking difficulty.  ALLERGIES: Allergies  Allergen Reactions  . Adhesive [Tape] Other (See Comments)    Plastic tape eats through skin - pls use paper tape  . Latex Other (See Comments)    Eats through skin    HOME MEDICATIONS: Outpatient Medications Prior to Visit  Medication Sig Dispense Refill  . baclofen (LIORESAL) 20 MG tablet Take 20 mg by mouth 3 (three) times daily.    Marland Kitchen HYDROcodone-acetaminophen (NORCO) 10-325 MG tablet Take 1 tablet by mouth every 4 (four) hours as needed (  pain).    . Ledipasvir-Sofosbuvir (HARVONI) 90-400 MG TABS Take 1 tablet by mouth daily. 28 tablet 5  . Multiple Vitamin (MULTIVITAMIN WITH MINERALS) TABS tablet Take 1 tablet by mouth daily.    Marland Kitchen OVER THE COUNTER MEDICATION Take 1 tablet by mouth daily. Keto    . sertraline (ZOLOFT) 50 MG  tablet Take 50 mg by mouth every morning.    Marland Kitchen Specialty Vitamins Products (BIOTIN PLUS KERATIN PO) Take 1 tablet by mouth daily.    . Vitamin D, Cholecalciferol, 25 MCG (1000 UT) TABS Take by mouth.     No facility-administered medications prior to visit.    PAST MEDICAL HISTORY: Past Medical History:  Diagnosis Date  . Arthritis   . Ataxia   . Cirrhosis ([place])   . Depression with anxiety   . Fibromyalgia   . Gallbladder sludge   . Hiatal hernia   . Migraine   . Substance abuse ([place])    in 1970's    PAST SURGICAL HISTORY: Past Surgical History:  Procedure Laterality Date  . ANKLE SURGERY Right 2011  . BREAST ENHANCEMENT SURGERY  1996  . TUBAL LIGATION  1979  . WRIST SURGERY Right 2016    FAMILY HISTORY: Family History  Problem Relation Age of Onset  . Bone cancer Mother   . Esophageal cancer Father     SOCIAL HISTORY: Social History   Socioeconomic History  . Marital status: Single    Spouse name: Not on file  . Number of children: 1  . Years of education: college  . Highest education level: Associate degree: occupational, Hotel manager, or vocational program  Occupational History  . Occupation: prep food  Tobacco Use  . Smoking status: Former Smoker    Quit date: 1998    Years since quitting: 23.4  . Smokeless tobacco: Never Used  Substance and Sexual Activity  . Alcohol use: Yes    Comment: occasional  . Drug use: Never  . Sexual activity: Not on file  Other Topics Concern  . Not on file  Social History Narrative   Lives at home with her granddaughter.   Left-handed.   Caffeine use:c 1-2 cups per day.   Social Determinants of Health   Financial Resource Strain:   . Difficulty of Paying Living Expenses:   Food Insecurity:   . Worried About Charity fundraiser in the Last Year:   . Arboriculturist in the Last Year:   Transportation Needs:   . Film/video editor (Medical):   Marland Kitchen Lack of Transportation (Non-Medical):   Physical Activity:   .  Days of Exercise per Week:   . Minutes of Exercise per Session:   Stress:   . Feeling of Stress :   Social Connections:   . Frequency of Communication with Friends and Family:   . Frequency of Social Gatherings with Friends and Family:   . Attends Religious Services:   . Active Member of Clubs or Organizations:   . Attends Archivist Meetings:   Marland Kitchen Marital Status:   Intimate Partner Violence:   . Fear of Current or Ex-Partner:   . Emotionally Abused:   Marland Kitchen Physically Abused:   . Sexually Abused:    PHYSICAL EXAM  Vitals:   03/08/20 1429  BP: 122/66  Pulse: 73  Weight: 136 lb (61.7 kg)   Body mass index is 21.95 kg/m.  Generalized: Well developed, in no acute distress   Neurological examination  Mentation: Alert oriented to time, place,  history taking. Follows all commands, speech is slightly slurred, voice somewhat shaky Cranial nerve II-XII: Pupils were equal round reactive to light. Extraocular movements were full, visual field were full on confrontational test. Facial sensation and strength were normal.  Head turning and shoulder shrug  were normal and symmetric. Motor: The motor testing reveals 5 over 5 strength of all 4 extremities. Good symmetric motor tone is noted throughout.  Sensory: Sensory testing is intact to soft touch on all 4 extremities. No evidence of extinction is noted.  Coordination: Cerebellar testing reveals good finger-nose-finger and heel to shin is slow and intentional Gait and station: Has to push off to stand, gait is wide-based, unsteady, tends drag left foot Reflexes: Deep tendon reflexes are symmetric and normal bilaterally.   DIAGNOSTIC DATA (LABS, IMAGING, TESTING) - I reviewed patient records, labs, notes, testing and imaging myself where available.  Lab Results  Component Value Date   WBC 2.3 (L) 01/11/2020   HGB 11.8 (L) 01/11/2020   HCT 37.8 01/11/2020   MCV 94.3 01/11/2020   PLT 97 (L) 01/11/2020      Component Value  Date/Time   NA 141 01/11/2020 0815   K 4.3 01/11/2020 0815   CL 110 01/11/2020 0815   CO2 27 01/11/2020 0815   GLUCOSE 106 (H) 01/11/2020 0815   BUN 7 (L) 01/11/2020 0815   CREATININE 0.70 01/11/2020 0815   CALCIUM 8.0 (L) 01/11/2020 0815   PROT 6.6 01/11/2020 0815   ALBUMIN 2.7 (L) 01/11/2020 0815   AST 49 (H) 01/11/2020 0815   ALT 37 01/11/2020 0815   ALT 63 (H) 11/01/2019 0955   ALKPHOS 138 (H) 01/11/2020 0815   BILITOT 1.0 01/11/2020 0815   GFRNONAA >60 01/11/2020 0815   GFRAA >60 01/11/2020 0815   No results found for: CHOL, HDL, LDLCALC, LDLDIRECT, TRIG, CHOLHDL No results found for: HGBA1C Lab Results  Component Value Date   VITAMINB12 896 11/03/2019   Lab Results  Component Value Date   TSH 2.300 11/03/2019      ASSESSMENT AND PLAN 64 y.o. year old female  has a past medical history of Arthritis, Ataxia, Cirrhosis ([place]), Depression with anxiety, Fibromyalgia, Gallbladder sludge, Hiatal hernia, Migraine, and Substance abuse ([place]). here with:  1.  Slow progressive gait abnormality 2.  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 Krista Blue for recheck on condition or sooner if needed  I spent 30 minutes of face-to-face and non-face-to-face time with patient.  This included previsit chart review, lab review, study review, order entry, electronic health record documentation, patient education.   Butler Denmark, AGNP-C,  DNP 03/08/2020, 3:07 PM [facility] [address] [place], [place] [address] [phone]

## 2020-03-08 ENCOUNTER — Ambulatory Visit: Payer: Medicare HMO | Admitting: Neurology

## 2020-03-08 ENCOUNTER — Encounter: Payer: Self-pay | Admitting: Neurology

## 2020-03-08 ENCOUNTER — Other Ambulatory Visit: Payer: Self-pay

## 2020-03-08 VITALS — BP 122/66 | HR 73 | Wt 136.0 lb

## 2020-03-08 DIAGNOSIS — R269 Unspecified abnormalities of gait and mobility: Secondary | ICD-10-CM

## 2020-03-08 DIAGNOSIS — R4781 Slurred speech: Secondary | ICD-10-CM

## 2020-03-08 NOTE — Patient Instructions (Signed)
It was nice to see you today! Please use safe ambulation, please be careful not to fall See you back in 6 months or sooner if needed :)

## 2020-03-21 ENCOUNTER — Ambulatory Visit: Payer: Medicare HMO | Admitting: Internal Medicine

## 2020-03-21 ENCOUNTER — Other Ambulatory Visit (INDEPENDENT_AMBULATORY_CARE_PROVIDER_SITE_OTHER): Payer: Medicare HMO

## 2020-03-21 DIAGNOSIS — K7469 Other cirrhosis of liver: Secondary | ICD-10-CM

## 2020-03-21 LAB — HEPATIC FUNCTION PANEL
ALT: 16 U/L (ref 0–35)
AST: 25 U/L (ref 0–37)
Albumin: 3.2 g/dL — ABNORMAL LOW (ref 3.5–5.2)
Alkaline Phosphatase: 140 U/L — ABNORMAL HIGH (ref 39–117)
Bilirubin, Direct: 0.3 mg/dL (ref 0.0–0.3)
Total Bilirubin: 0.9 mg/dL (ref 0.2–1.2)
Total Protein: 6.9 g/dL (ref 6.0–8.3)

## 2020-03-21 MED FILL — HARVONI 90-400 MG TABLET: 90-400 | 28 days supply | Qty: 28 | Fill #1

## 2020-03-22 LAB — IGG: IgG (Immunoglobin G), Serum: 2628 mg/dL — ABNORMAL HIGH (ref 600–1540)

## 2020-04-02 ENCOUNTER — Other Ambulatory Visit: Payer: Self-pay

## 2020-04-02 ENCOUNTER — Ambulatory Visit (INDEPENDENT_AMBULATORY_CARE_PROVIDER_SITE_OTHER): Payer: Medicare HMO | Admitting: Pharmacist

## 2020-04-02 DIAGNOSIS — B182 Chronic viral hepatitis C: Secondary | ICD-10-CM

## 2020-04-02 DIAGNOSIS — K7469 Other cirrhosis of liver: Secondary | ICD-10-CM | POA: Diagnosis not present

## 2020-04-02 NOTE — Progress Notes (Addendum)
HPI: Kelly Singh is a 64 y.o. female who presents to the [place] clinic for Hepatitis C follow-up.  Medication: Harvoni x 24 weeks  Start Date: 02/29/20  Hepatitis C Genotype: 1a  Fibrosis Score: F4 - decompensated cirrhosis  Hepatitis C RNA: 2 million  Patient Active Problem List   Diagnosis Date Noted  . Slurred speech 03/08/2020  . Other cirrhosis of liver ([place]) 12/05/2019  . Dizzy 11/09/2019  . Fall 11/09/2019  . Hypomagnesemia 11/09/2019  . Scalp laceration 11/09/2019  . Alcohol intoxication ([place]) 11/09/2019  . Hypokalemia   . Gait abnormality 11/03/2019  . Chronic hepatitis C without hepatic coma ([place]) 11/01/2019    Patient's Medications  New Prescriptions   No medications on file  Previous Medications   BACLOFEN (LIORESAL) 20 MG TABLET    Take 20 mg by mouth 3 (three) times daily.   HYDROCODONE-ACETAMINOPHEN (NORCO) 10-325 MG TABLET    Take 1 tablet by mouth every 4 (four) hours as needed (pain).   LEDIPASVIR-SOFOSBUVIR (HARVONI) 90-400 MG TABS    Take 1 tablet by mouth daily.   MULTIPLE VITAMIN (MULTIVITAMIN WITH MINERALS) TABS TABLET    Take 1 tablet by mouth daily.   OVER THE COUNTER MEDICATION    Take 1 tablet by mouth daily. Keto   SERTRALINE (ZOLOFT) 50 MG TABLET    Take 50 mg by mouth every morning.   SPECIALTY VITAMINS PRODUCTS (BIOTIN PLUS KERATIN PO)    Take 1 tablet by mouth daily.   VITAMIN D, CHOLECALCIFEROL, 25 MCG (1000 UT) TABS    Take by mouth.  Modified Medications   No medications on file  Discontinued Medications   No medications on file    Allergies: Allergies  Allergen Reactions  . Adhesive [Tape] Other (See Comments)    Plastic tape eats through skin - pls use paper tape  . Latex Other (See Comments)    Eats through skin    Past Medical History: Past Medical History:  Diagnosis Date  . Arthritis   . Ataxia   . Cirrhosis ([place])   . Depression with anxiety   . Fibromyalgia   . Gallbladder sludge   . Hiatal hernia    . Migraine   . Substance abuse ([place])    in 1970's    Social History: Social History   Socioeconomic History  . Marital status: Single    Spouse name: Not on file  . Number of children: 1  . Years of education: college  . Highest education level: Associate degree: occupational, Hotel manager, or vocational program  Occupational History  . Occupation: prep food  Tobacco Use  . Smoking status: Former Smoker    Quit date: 1998    Years since quitting: 23.5  . Smokeless tobacco: Never Used  Vaping Use  . Vaping Use: Never used  Substance and Sexual Activity  . Alcohol use: Yes    Comment: occasional  . Drug use: Never  . Sexual activity: Not on file  Other Topics Concern  . Not on file  Social History Narrative   Lives at home with her granddaughter.   Left-handed.   Caffeine use:c 1-2 cups per day.   Social Determinants of Health   Financial Resource Strain:   . Difficulty of Paying Living Expenses:   Food Insecurity:   . Worried About Charity fundraiser in the Last Year:   . Arboriculturist in the Last Year:   Transportation Needs:   . Film/video editor (Medical):   Marland Kitchen  Lack of Transportation (Non-Medical):   Physical Activity:   . Days of Exercise per Week:   . Minutes of Exercise per Session:   Stress:   . Feeling of Stress :   Social Connections:   . Frequency of Communication with Friends and Family:   . Frequency of Social Gatherings with Friends and Family:   . Attends Religious Services:   . Active Member of Clubs or Organizations:   . Attends Archivist Meetings:   Marland Kitchen Marital Status:     Labs: Hepatitis C Lab Results  Component Value Date   HCVGENOTYPE 1a 11/01/2019   FIBROSTAGE F4 11/01/2019   Hepatitis B Lab Results  Component Value Date   HEPBSAB NON-REACTIVE 12/15/2019   HEPBSAG NON-REACTIVE 12/15/2019   Hepatitis A Lab Results  Component Value Date   HAV NON-REACTIVE 12/15/2019   HIV Lab Results  Component Value Date     HIV Non Reactive 11/03/2019   Lab Results  Component Value Date   CREATININE 0.70 01/11/2020   CREATININE 0.61 12/15/2019   CREATININE 0.68 11/09/2019   CREATININE 0.81 11/08/2019   CREATININE 0.73 10/13/2019   Lab Results  Component Value Date   AST 25 03/21/2020   AST 49 (H) 01/11/2020   AST 50 (H) 12/15/2019   ALT 16 03/21/2020   ALT 37 01/11/2020   ALT 36 (H) 12/15/2019   INR 1.3 (H) 12/15/2019    Assessment: Kelly Singh is here today to follow-up for her chronic Hepatitis C infection.  She is treatment naive and has decompensated cirrhosis. She started 24 weeks of Harvoni about 1 month ago.  She states that she is tolerating it well. She was initially suffering from headaches but they have since resolved. No other issues.  She has already started her 2nd month and I reminded her that she will need 6 total months of treatment.  She has not missed a single dose and takes it every evening at 630pm with dinner. She sets an alarm for every evening so that she does not forget. I will check labs today and have her come back in 2 months to follow up.  She requests a Friday appointment (her off day), so I will schedule her for labs and call her the next week to touch base.  Will schedule her with Marya Amsler for her end of treatment visit.  Plan: - Continue Harvoni x 24 weeks - Hep C viral load, CBC, CMET today - F/u for labs 8/27 at 10am - Will call after lab appointment to touch base  Day Deery L. Nicolae Vasek, PharmD, BCIDP, AAHIVP, CPP Clinical Pharmacist Practitioner Infectious Diseases [facility] 04/02/2020, 3:06 PM

## 2020-04-04 LAB — CBC
HCT: 36.9 % (ref 35.0–45.0)
Hemoglobin: 12.4 g/dL (ref 11.7–15.5)
MCH: 30.8 pg (ref 27.0–33.0)
MCHC: 33.6 g/dL (ref 32.0–36.0)
MCV: 91.8 fL (ref 80.0–100.0)
MPV: 12.7 fL — ABNORMAL HIGH (ref 7.5–12.5)
Platelets: 87 10*3/uL — ABNORMAL LOW (ref 140–400)
RBC: 4.02 10*6/uL (ref 3.80–5.10)
RDW: 14.2 % (ref 11.0–15.0)
WBC: 3.1 10*3/uL — ABNORMAL LOW (ref 3.8–10.8)

## 2020-04-04 LAB — HEPATITIS C RNA QUANTITATIVE
HCV Quantitative Log: <1.18 Log IU/mL
HCV RNA, PCR, QN: <15 IU/mL

## 2020-04-04 LAB — COMPREHENSIVE METABOLIC PANEL
AG Ratio: 0.8 (calc) — ABNORMAL LOW (ref 1.0–2.5)
ALT: 19 U/L (ref 6–29)
AST: 27 U/L (ref 10–35)
Albumin: 3.1 g/dL — ABNORMAL LOW (ref 3.6–5.1)
Alkaline phosphatase (APISO): 123 U/L (ref 37–153)
BUN: 11 mg/dL (ref 7–25)
CO2: 28 mmol/L (ref 20–32)
Calcium: 8.3 mg/dL — ABNORMAL LOW (ref 8.6–10.4)
Chloride: 106 mmol/L (ref 98–110)
Creat: 0.62 mg/dL (ref 0.50–0.99)
Globulin: 3.8 g/dL (calc) — ABNORMAL HIGH (ref 1.9–3.7)
Glucose, Bld: 83 mg/dL (ref 65–99)
Potassium: 3.7 mmol/L (ref 3.5–5.3)
Sodium: 139 mmol/L (ref 135–146)
Total Bilirubin: 0.9 mg/dL (ref 0.2–1.2)
Total Protein: 6.9 g/dL (ref 6.1–8.1)

## 2020-04-11 ENCOUNTER — Ambulatory Visit: Payer: Medicare HMO | Admitting: Internal Medicine

## 2020-04-11 ENCOUNTER — Encounter: Payer: Self-pay | Admitting: Internal Medicine

## 2020-04-11 VITALS — BP 114/60 | HR 72 | Ht 66.0 in | Wt 137.2 lb

## 2020-04-11 DIAGNOSIS — R932 Abnormal findings on diagnostic imaging of liver and biliary tract: Secondary | ICD-10-CM

## 2020-04-11 DIAGNOSIS — B182 Chronic viral hepatitis C: Secondary | ICD-10-CM | POA: Diagnosis not present

## 2020-04-11 DIAGNOSIS — K7460 Unspecified cirrhosis of liver: Secondary | ICD-10-CM

## 2020-04-11 DIAGNOSIS — K766 Portal hypertension: Secondary | ICD-10-CM

## 2020-04-11 NOTE — Patient Instructions (Signed)
Please follow up with Dr Hilarie Fredrickson in 4 months. ____________________________________________  We have CANCELLED your appointment with Kelly Singh on 04/20/20 at 2.  You have been scheduled for an MRI/MRCP at [facility] on 04/25/20. Your appointment time is 12:00 pm. Please arrive 30 minutes prior to your appointment time for registration purposes. Please make certain not to have anything to eat or drink 4 hours prior to your test. In addition, if you have any metal in your body, have a pacemaker or defibrillator, please be sure to let your ordering physician know. This test typically takes 45 minutes to 1 hour to complete. Should you need to reschedule, please call [phone] to do so. _____________________________________________ If you are age 85 or older, your body mass index should be between 23-30. Your Body mass index is 22.15 kg/m. If this is out of the aforementioned range listed, please consider follow up with your Primary Care Provider.  If you are age 69 or younger, your body mass index should be between 19-25. Your Body mass index is 22.15 kg/m. If this is out of the aformentioned range listed, please consider follow up with your Primary Care Provider.  ______________________________________________ Due to recent changes in healthcare laws, you may see the results of your imaging and laboratory studies on MyChart before your provider has had a chance to review them.  We understand that in some cases there may be results that are confusing or concerning to you. Not all laboratory results come back in the same time frame and the provider may be waiting for multiple results in order to interpret others.  Please give [place] 48 hours in order for your provider to thoroughly review all the results before contacting the office for clarification of your results.

## 2020-04-11 NOTE — Progress Notes (Signed)
Subjective:    Patient ID: Kelly Singh, female    DOB: June 12, 1956, 64 y.o.   MRN: [id number]  HPI Kelly Singh is a 64 year old female with hepatitis C cirrhosis, question of autoimmune hepatitis, fibromyalgia, ataxia who is seen in follow-up.  She is here alone today.  She was seen by Ellouise Newer, PA-C on 12/15/2019 after being referred for cirrhosis.  She has now been treated for hepatitis C with Harvoni.  She has been on this therapy for approximately 8 weeks.  She came for upper endoscopy on 01/18/2020 to rule out varices and this was normal.  Of note on lab work she has found to have an elevated ANA, positive anti-smooth muscle antibody and an elevated IgG.  She reports she is feeling well.  She denies abdominal swelling.  She does have some mild lower extremity edema worse after standing all day.  She works Banker at [employer].  No abdominal pain.  Good appetite.  No nausea or vomiting.  Regular bowel movements.  No blood in stool or melena.  No itching.  She is not drinking alcohol but did drink alcohol socially.  She reports never having been a heavy drinker but would enjoy a [place] on Sunday afternoons with her friends.   Review of Systems As per HPI, otherwise negative  Current Medications, Allergies, Past Medical History, Past Surgical History, Family History and Social History were reviewed in [employer] record.     Objective:   Physical Exam BP 114/60 (BP Location: Left Arm, Patient Position: Sitting, Cuff Size: Normal)    Pulse 72    Ht 5\' 6"  (1.676 m) Comment: height measured without shoes   Wt 137 lb 4 oz (62.3 kg)    BMI 22.15 kg/m  Gen: awake, alert, NAD HEENT: anicteric CV: RRR, no mrg Pulm: CTA b/l Abd: soft, NT/ND, +BS throughout Ext: no c/c, 1+ lower extremity edema Neuro: nonfocal, shuffling somewhat ataxic gait, no asterixis  CBC    Component Value Date/Time   WBC 3.1 (L) 04/02/2020 1537   RBC 4.02 04/02/2020 1537    HGB 12.4 04/02/2020 1537   HGB 11.8 (L) 01/11/2020 0815   HCT 36.9 04/02/2020 1537   PLT 87 (L) 04/02/2020 1537   PLT 97 (L) 01/11/2020 0815   MCV 91.8 04/02/2020 1537   MCH 30.8 04/02/2020 1537   MCHC 33.6 04/02/2020 1537   RDW 14.2 04/02/2020 1537   LYMPHSABS 1.1 01/11/2020 0815   MONOABS 0.2 01/11/2020 0815   EOSABS 0.1 01/11/2020 0815   BASOSABS 0.0 01/11/2020 0815   CMP Latest Ref Rng & Units 04/02/2020 03/21/2020 01/11/2020  Glucose 65 - 99 mg/dL 83 - 106(H)  BUN 7 - 25 mg/dL 11 - 7(L)  Creatinine 0.50 - 0.99 mg/dL 0.62 - 0.70  Sodium 135 - 146 mmol/L 139 - 141  Potassium 3.5 - 5.3 mmol/L 3.7 - 4.3  Chloride 98 - 110 mmol/L 106 - 110  CO2 20 - 32 mmol/L 28 - 27  Calcium 8.6 - 10.4 mg/dL 8.3(L) - 8.0(L)  Total Protein 6.1 - 8.1 g/dL 6.9 6.9 6.6  Total Bilirubin 0.2 - 1.2 mg/dL 0.9 0.9 1.0  Alkaline Phos 39 - 117 U/L - 140(H) 138(H)  AST 10 - 35 U/L 27 25 49(H)  ALT 6 - 29 U/L 19 16 37     Lab Results  Component Value Date   INR 1.3 (H) 12/15/2019     MRI ABDOMEN WITHOUT AND WITH CONTRAST   TECHNIQUE: Multiplanar  multisequence MR imaging of the abdomen was performed both before and after the administration of intravenous contrast.   CONTRAST:  6.85mL GADAVIST GADOBUTROL 1 MMOL/ML IV SOLN   COMPARISON:  Ultrasound evaluation of 12/13/2019   FINDINGS: Lower chest: Incidental imaging of the lung bases with limited assessment on MRI. Lung bases are grossly clear.   Hepatobiliary: Lobular hepatic contours with areas of geographic lace-like pattern of increased T2 signal. Sludge in the gallbladder.   Area in the anterior left hepatic lobe, lateral segment displays increased T2 signal with fading pattern on diffusion-weighted imaging measuring 8 mm (image 18, series 9) becoming less conspicuous on later phases but without clear signs of benign findings due to respiratory variation. i.e. E without clear evidence of peripheral and nodular enhancement.   Extrahepatic  biliary ductal dilation is segmental and mild. No overt intrahepatic biliary ductal distension. Common bile duct up to 7 mm caliber. The common hepatic duct 3-4 mm caliber.   Pancreas: Pancreas is normal without signs of ductal dilation or peripancreatic inflammation.   Spleen:  Spleen mildly enlarged approximately 13 cm.   Adrenals/Urinary Tract:  Adrenal glands are normal.   Large cyst arises from the upper pole of the left kidney measuring approximately 4 by 4 cm. No sign of hydronephrosis.   Stomach/Bowel: Normal to the extent visualized. Pelvic bowel loops are not imaged. Study was not protocol for bowel evaluation.   Vascular/Lymphatic: Vascular structures in the abdomen are patent. Recanalized umbilical vein with body wall collaterals and upper abdominal venous collaterals. No adenopathy with mild enlargement of periportal lymph nodes.   Other:  Trace perihepatic ascites.   Musculoskeletal: No acute or destructive process.   IMPRESSION: 1. Signs of cirrhosis and portal venous hypertension including portosystemic collaterals and mild splenomegaly. 2. Focal hepatic lesion characterized as LI-RADS category 3. Follow-up imaging at 3-6 months is suggested though this may represent a benign hemangioma, exam confounded by respiratory variation. 3. Mild dilation of the extrahepatic biliary tree is suggested and may be segmental. If there is clinical concern for autoimmune hepatitis correlation with alkaline phosphatase may be helpful with follow-up imaging to include MRCP given the possibility of autoimmune/PSC overlap. GI consultation may be useful if not yet performed. 4. Sludge in the gallbladder.     Electronically Signed   By: Zetta Bills M.D.   On: 01/04/2020 18:09      Assessment & Plan:  64 year old female with hepatitis C cirrhosis, question of autoimmune hepatitis, fibromyalgia, ataxia who is seen in follow-up.  1.  Hepatitis C cirrhosis/possible  autoimmune hepatitis --her hepatitis C is being treated by ID and she is approximately 8 weeks through treatment with Harvoni.  Her recent hep C RNA was undetectable as of 1 week ago.  I am concerned that she may have autoimmune hepatitis as well given her positive ANA, smooth muscle antibody and elevated IgG.  However her most recent enzymes were normal and so I would like her to complete hepatitis C treatment, have the Harvoni removed and reevaluate at that time.  I also think liver biopsy should be performed to evaluate for any disease activity of autoimmune hepatitis which would drive treatment decisions.  She does have elements of portal hypertension by imaging but no esophageal or gastric varices or portal gastropathy.  Her platelets are low secondary to splenic sequestration. --Complete Harvoni therapy and then return to GI clinic with me --Plan liver biopsy once off of Harvoni therapy to evaluate for autoimmune hepatitis activity (high suspicion given  autoimmune markers and elevated IgG).  Given normalization of liver enzymes I think it is reasonable to wait until Harvoni therapy is complete before liver biopsy --No evidence for esophageal varices or gastric varices by EGD in April; repeat April 2023 --[facility] screening= focal hepatic lesion seen by MRI in March, repeat MRI with and without contrast plus MRCP recommended at this time and ordered today --Evaluate biliary tree dilatation with MRCP --I strongly advised that she avoid alcohol and follow low-sodium diet --No evidence for hepatic encephalopathy or ascites today, will monitor going forward  2.  Colon cancer screening --she reports a normal colonoscopy around 2011 in [place].  We will discuss repeat screening colonoscopy at next office visit  26-month follow-up with me  30 minutes total spent today including patient facing time, coordination of care, reviewing medical history/procedures/pertinent radiology studies, and documentation of the  encounter.

## 2020-04-20 ENCOUNTER — Ambulatory Visit: Payer: Medicare HMO | Admitting: Nurse Practitioner

## 2020-04-20 MED FILL — HARVONI 90-400 MG TABLET: 90-400 | 28 days supply | Qty: 28 | Fill #2

## 2020-04-25 ENCOUNTER — Ambulatory Visit (HOSPITAL_COMMUNITY): Payer: Medicare HMO

## 2020-04-27 ENCOUNTER — Other Ambulatory Visit: Payer: Self-pay | Admitting: Internal Medicine

## 2020-04-27 ENCOUNTER — Ambulatory Visit (HOSPITAL_COMMUNITY)
Admission: RE | Admit: 2020-04-27 | Discharge: 2020-04-27 | Disposition: A | Discharge status: Home / Self Care | Payer: Medicare HMO | Source: Ambulatory Visit | Attending: Internal Medicine | Admitting: Internal Medicine

## 2020-04-27 ENCOUNTER — Other Ambulatory Visit: Payer: Self-pay

## 2020-04-27 DIAGNOSIS — R932 Abnormal findings on diagnostic imaging of liver and biliary tract: Secondary | ICD-10-CM

## 2020-04-27 DIAGNOSIS — K7460 Unspecified cirrhosis of liver: Secondary | ICD-10-CM | POA: Diagnosis not present

## 2020-04-27 DIAGNOSIS — K828 Other specified diseases of gallbladder: Secondary | ICD-10-CM | POA: Diagnosis not present

## 2020-04-27 DIAGNOSIS — K7689 Other specified diseases of liver: Secondary | ICD-10-CM | POA: Diagnosis not present

## 2020-04-27 MED ORDER — GADOBUTROL 1 MMOL/ML IV SOLN
5.0000 mL | Freq: Once | INTRAVENOUS | Status: AC | PRN
  Administered 2020-04-27: 5 mL via INTRAVENOUS

## 2020-05-01 ENCOUNTER — Telehealth: Payer: Self-pay | Admitting: Internal Medicine

## 2020-05-02 NOTE — Telephone Encounter (Signed)
See results notes. 

## 2020-05-21 ENCOUNTER — Telehealth: Payer: Self-pay | Admitting: Pharmacy Technician

## 2020-05-21 MED FILL — HARVONI 90-400 MG TABLET: 90-400 | 28 days supply | Qty: 28 | Fill #3

## 2020-05-21 NOTE — Telephone Encounter (Signed)
[facility] Patient Advocate Encounter   I was successful in securing patient an $86 grant from Patient [employer] [facility]) to provide copayment coverage for Harvoni.  This will make the out of pocket cost $0.    The billing information is as follows and has been shared with [facility] pharmacy.    Patient knows to call the office with questions or concerns.  Kelly Singh. Kelly Singh Patient [facility] for Infectious Disease Phone: [phone] Fax:  [phone]

## 2020-05-25 DIAGNOSIS — G43909 Migraine, unspecified, not intractable, without status migrainosus: Secondary | ICD-10-CM | POA: Diagnosis not present

## 2020-05-25 DIAGNOSIS — G8929 Other chronic pain: Secondary | ICD-10-CM | POA: Diagnosis not present

## 2020-05-25 DIAGNOSIS — R269 Unspecified abnormalities of gait and mobility: Secondary | ICD-10-CM | POA: Diagnosis not present

## 2020-05-25 DIAGNOSIS — M5136 Other intervertebral disc degeneration, lumbar region: Secondary | ICD-10-CM | POA: Diagnosis not present

## 2020-05-31 ENCOUNTER — Telehealth: Payer: Self-pay | Admitting: Oncology

## 2020-05-31 NOTE — Telephone Encounter (Signed)
Released OV notes to new PCP @[phone]  Release:  [phone]

## 2020-06-01 ENCOUNTER — Other Ambulatory Visit: Payer: Medicare HMO

## 2020-06-01 ENCOUNTER — Other Ambulatory Visit: Payer: Self-pay

## 2020-06-01 DIAGNOSIS — B182 Chronic viral hepatitis C: Secondary | ICD-10-CM

## 2020-06-01 DIAGNOSIS — K7469 Other cirrhosis of liver: Secondary | ICD-10-CM

## 2020-06-03 LAB — COMPREHENSIVE METABOLIC PANEL
AG Ratio: 1.1 (calc) (ref 1.0–2.5)
ALT: 21 U/L (ref 6–29)
AST: 27 U/L (ref 10–35)
Albumin: 3.5 g/dL — ABNORMAL LOW (ref 3.6–5.1)
Alkaline phosphatase (APISO): 147 U/L (ref 37–153)
BUN: 11 mg/dL (ref 7–25)
CO2: 29 mmol/L (ref 20–32)
Calcium: 8.4 mg/dL — ABNORMAL LOW (ref 8.6–10.4)
Chloride: 106 mmol/L (ref 98–110)
Creat: 0.65 mg/dL (ref 0.50–0.99)
Globulin: 3.3 g/dL (calc) (ref 1.9–3.7)
Glucose, Bld: 95 mg/dL (ref 65–99)
Potassium: 4.1 mmol/L (ref 3.5–5.3)
Sodium: 139 mmol/L (ref 135–146)
Total Bilirubin: 1.1 mg/dL (ref 0.2–1.2)
Total Protein: 6.8 g/dL (ref 6.1–8.1)

## 2020-06-03 LAB — CBC
HCT: 38.5 % (ref 35.0–45.0)
Hemoglobin: 12.7 g/dL (ref 11.7–15.5)
MCH: 31 pg (ref 27.0–33.0)
MCHC: 33 g/dL (ref 32.0–36.0)
MCV: 93.9 fL (ref 80.0–100.0)
MPV: 12.5 fL (ref 7.5–12.5)
Platelets: 97 10*3/uL — ABNORMAL LOW (ref 140–400)
RBC: 4.1 10*6/uL (ref 3.80–5.10)
RDW: 13.2 % (ref 11.0–15.0)
WBC: 2.6 10*3/uL — ABNORMAL LOW (ref 3.8–10.8)

## 2020-06-03 LAB — HEPATITIS C RNA QUANTITATIVE
HCV RNA, PCR, QN (Log): <1.18 log IU/mL
HCV RNA, PCR, QN: <15 IU/mL

## 2020-06-15 MED FILL — HARVONI 90-400 MG TABLET: 90-400 | 28 days supply | Qty: 28 | Fill #4

## 2020-07-13 MED FILL — HARVONI 90-400 MG TABLET: 90-400 | 28 days supply | Qty: 28 | Fill #5

## 2020-07-25 DIAGNOSIS — M549 Dorsalgia, unspecified: Secondary | ICD-10-CM | POA: Diagnosis not present

## 2020-07-25 DIAGNOSIS — G8929 Other chronic pain: Secondary | ICD-10-CM | POA: Diagnosis not present

## 2020-07-25 DIAGNOSIS — M5136 Other intervertebral disc degeneration, lumbar region: Secondary | ICD-10-CM | POA: Diagnosis not present

## 2020-07-25 DIAGNOSIS — M546 Pain in thoracic spine: Secondary | ICD-10-CM | POA: Diagnosis not present

## 2020-07-25 DIAGNOSIS — M545 Low back pain, unspecified: Secondary | ICD-10-CM | POA: Diagnosis not present

## 2020-07-25 DIAGNOSIS — M542 Cervicalgia: Secondary | ICD-10-CM | POA: Diagnosis not present

## 2020-08-04 ENCOUNTER — Encounter (HOSPITAL_COMMUNITY): Payer: Self-pay | Admitting: Emergency Medicine

## 2020-08-04 ENCOUNTER — Other Ambulatory Visit: Payer: Self-pay

## 2020-08-04 ENCOUNTER — Emergency Department (HOSPITAL_COMMUNITY)
Admission: EM | Admit: 2020-08-04 | Discharge: 2020-08-04 | Disposition: A | Discharge status: Home / Self Care | Payer: Worker's Compensation | Attending: Emergency Medicine | Admitting: Emergency Medicine

## 2020-08-04 ENCOUNTER — Emergency Department (HOSPITAL_COMMUNITY): Payer: Worker's Compensation

## 2020-08-04 DIAGNOSIS — Z91040 Latex allergy status: Secondary | ICD-10-CM | POA: Insufficient documentation

## 2020-08-04 DIAGNOSIS — Z79899 Other long term (current) drug therapy: Secondary | ICD-10-CM | POA: Diagnosis not present

## 2020-08-04 DIAGNOSIS — S99921A Unspecified injury of right foot, initial encounter: Secondary | ICD-10-CM | POA: Diagnosis present

## 2020-08-04 DIAGNOSIS — Y990 Civilian activity done for income or pay: Secondary | ICD-10-CM | POA: Insufficient documentation

## 2020-08-04 DIAGNOSIS — W208XXA Other cause of strike by thrown, projected or falling object, initial encounter: Secondary | ICD-10-CM | POA: Diagnosis not present

## 2020-08-04 DIAGNOSIS — S90421A Blister (nonthermal), right great toe, initial encounter: Secondary | ICD-10-CM | POA: Insufficient documentation

## 2020-08-04 DIAGNOSIS — Z87891 Personal history of nicotine dependence: Secondary | ICD-10-CM | POA: Diagnosis not present

## 2020-08-04 DIAGNOSIS — S92411A Displaced fracture of proximal phalanx of right great toe, initial encounter for closed fracture: Secondary | ICD-10-CM | POA: Diagnosis not present

## 2020-08-04 DIAGNOSIS — Z23 Encounter for immunization: Secondary | ICD-10-CM | POA: Insufficient documentation

## 2020-08-04 LAB — CBC WITH DIFFERENTIAL/PLATELET
Abs Immature Granulocytes: 0.01 10*3/uL (ref 0.00–0.07)
Basophils Absolute: 0 10*3/uL (ref 0.0–0.1)
Basophils Relative: 1 %
Eosinophils Absolute: 0.1 10*3/uL (ref 0.0–0.5)
Eosinophils Relative: 3 %
HCT: 40.1 % (ref 36.0–46.0)
Hemoglobin: 12.9 g/dL (ref 12.0–15.0)
Immature Granulocytes: 0 %
Lymphocytes Relative: 33 %
Lymphs Abs: 1 10*3/uL (ref 0.7–4.0)
MCH: 31 pg (ref 26.0–34.0)
MCHC: 32.2 g/dL (ref 30.0–36.0)
MCV: 96.4 fL (ref 80.0–100.0)
Monocytes Absolute: 0.3 10*3/uL (ref 0.1–1.0)
Monocytes Relative: 10 %
Neutro Abs: 1.6 10*3/uL — ABNORMAL LOW (ref 1.7–7.7)
Neutrophils Relative %: 53 %
Platelets: 96 10*3/uL — ABNORMAL LOW (ref 150–400)
RBC: 4.16 MIL/uL (ref 3.87–5.11)
RDW: 13.6 % (ref 11.5–15.5)
WBC: 2.9 10*3/uL — ABNORMAL LOW (ref 4.0–10.5)
nRBC: 0 % (ref 0.0–0.2)

## 2020-08-04 LAB — COMPREHENSIVE METABOLIC PANEL
ALT: 19 U/L (ref 0–44)
AST: 26 U/L (ref 15–41)
Albumin: 3.1 g/dL — ABNORMAL LOW (ref 3.5–5.0)
Alkaline Phosphatase: 127 U/L — ABNORMAL HIGH (ref 38–126)
Anion gap: 5 (ref 5–15)
BUN: 13 mg/dL (ref 8–23)
CO2: 30 mmol/L (ref 22–32)
Calcium: 8.6 mg/dL — ABNORMAL LOW (ref 8.9–10.3)
Chloride: 106 mmol/L (ref 98–111)
Creatinine, Ser: 0.69 mg/dL (ref 0.44–1.00)
GFR, Estimated: >60 mL/min (ref >60)
Glucose, Bld: 78 mg/dL (ref 70–99)
Potassium: 3.8 mmol/L (ref 3.5–5.1)
Sodium: 141 mmol/L (ref 135–145)
Total Bilirubin: 1.1 mg/dL (ref 0.3–1.2)
Total Protein: 6.8 g/dL (ref 6.5–8.1)

## 2020-08-04 LAB — LACTIC ACID, PLASMA
Lactic Acid, Venous: 1.3 mmol/L (ref 0.5–1.9)
Lactic Acid, Venous: 1.6 mmol/L (ref 0.5–1.9)

## 2020-08-04 MED ORDER — TETANUS-DIPHTH-ACELL PERTUSSIS 5-2.5-18.5 LF-MCG/0.5 IM SUSY
0.5000 mL | PREFILLED_SYRINGE | Freq: Once | INTRAMUSCULAR | Status: AC
  Administered 2020-08-04: 0.5 mL via INTRAMUSCULAR
  Filled 2020-08-04: qty 0.5

## 2020-08-04 MED ORDER — CEPHALEXIN 500 MG PO CAPS
500.0000 mg | ORAL_CAPSULE | Freq: Four times a day (QID) | ORAL | 0 refills | Status: DC
Start: 2020-08-04 — End: 2020-08-16

## 2020-08-04 NOTE — ED Notes (Signed)
Patient transported to X-ray 

## 2020-08-04 NOTE — Discharge Instructions (Signed)
Please pick up antibiotic and take as prescribed.  You can soak your foot in epsom salts and keep blisters clean and dry. They will likely pop on their own; I would not recommend popping them yourself.  Your xray showed a fracture of your great toe; please wear post op shoe and follow up with Dr. Doreatha Martin orthopedist for further recommendations While at home please rest, ice, and elevate your foot to help reduce pain/swelling Take Ibuprofen and Tylenol as needed for pain Return to the ED for any worsening symptoms including worsening swelling/redness of your foot, fevers > 100.4, chills, or any other new/worsening symptoms

## 2020-08-04 NOTE — ED Provider Notes (Signed)
Kelly Singh Provider Note   CSN: [id number] Arrival date & time: 08/04/20  1316     History Chief Complaint  Patient presents with  . Toe Pain    Kelly Singh is a 64 y.o. female with PMHx fibromyalgia, depression, cirrhosis who presents to the ED today with complaint of sudden onset, constant, worsening, R foot pain x 6 days. Pt reports she was at work earlier this week when she dropped a 50 pound bucket of pickles onto her right foot; pt was wearing a shoe at that time and denies the pickle juice spilling onto her shoe. She continued to work the remainder of her shift however when she went home and removed her shoe she noticed some redness to the base of the great toe. Pt woke up the next morning with small blisters to the same area that have since grown in size. She has continued to work and has worn a tennis shoe with cotton padding on top to help reduce the pain from the blisters rubbing against her shoes however today her foot felt warm to the touch and "feverish" causing her concern. She denies any systemic fever - they have temperature checks at work daily. Pt denies any other symptoms at this time.   The history is provided by the patient and medical records.       Past Medical History:  Diagnosis Date  . Arthritis   . Ataxia   . Cirrhosis ([place])   . Depression with anxiety   . Fibromyalgia   . Gallbladder sludge   . Hiatal hernia   . Migraine   . Substance abuse ([place])    in 1970's    Patient Active Problem List   Diagnosis Date Noted  . Slurred speech 03/08/2020  . Other cirrhosis of liver ([place]) 12/05/2019  . Dizzy 11/09/2019  . Fall 11/09/2019  . Hypomagnesemia 11/09/2019  . Scalp laceration 11/09/2019  . Alcohol intoxication ([place]) 11/09/2019  . Hypokalemia   . Gait abnormality 11/03/2019  . Chronic hepatitis C without hepatic coma ([place]) 11/01/2019    Past Surgical History:  Procedure Laterality Date  . ANKLE  SURGERY Right 2011  . BREAST ENHANCEMENT SURGERY  1996  . TUBAL LIGATION  1979  . WRIST SURGERY Right 2016     OB History   No obstetric history on file.     Family History  Problem Relation Age of Onset  . Bone cancer Mother   . Esophageal cancer Father     Social History   Tobacco Use  . Smoking status: Former Smoker    Quit date: 1998    Years since quitting: 23.8  . Smokeless tobacco: Never Used  Vaping Use  . Vaping Use: Never used  Substance Use Topics  . Alcohol use: Yes    Comment: occasional  . Drug use: Never    Home Medications Prior to Admission medications   Medication Sig Start Date End Date Taking? Authorizing Provider  baclofen (LIORESAL) 20 MG tablet Take 20 mg by mouth 3 (three) times daily.    [provider]  cephALEXin (KEFLEX) 500 MG capsule Take 1 capsule (500 mg total) by mouth 4 (four) times daily. 08/04/20   Eustaquio Maize, PA-C  HYDROcodone-acetaminophen (NORCO) 10-325 MG tablet Take 1 tablet by mouth every 4 (four) hours as needed (pain).    [provider]  Ledipasvir-Sofosbuvir (HARVONI) 90-400 MG TABS Take 1 tablet by mouth daily. 02/23/20   Kuppelweiser, Cassie L, RPH-CPP  Multiple Vitamin (MULTIVITAMIN WITH MINERALS) TABS tablet Take 1 tablet by mouth daily.    [provider]  NARCAN 4 MG/0.1ML LIQD nasal spray kit as needed. Patient not taking: Reported on 04/11/2020 03/26/20   [provider]  OVER THE COUNTER MEDICATION Take 1 tablet by mouth daily. Keto    [provider]  sertraline (ZOLOFT) 50 MG tablet Take 50 mg by mouth every morning.    [provider]  Specialty Vitamins Products (BIOTIN PLUS KERATIN PO) Take 1 tablet by mouth daily.    [provider]  Vitamin D, Cholecalciferol, 25 MCG (1000 UT) TABS Take by mouth.    [provider]    Allergies    Adhesive [tape] and Latex  Review of Systems   Review of Systems  Constitutional: Negative for chills  and fever.  Musculoskeletal: Positive for arthralgias and joint swelling.  Skin:       + blisters to R great toe  All other systems reviewed and are negative.   Physical Exam Updated Vital Signs BP (!) 171/85   Pulse (!) 52   Temp 98.1 F (36.7 C) (Oral)   Resp 16   SpO2 100%   Physical Exam Vitals and nursing note reviewed.  Constitutional:      Appearance: She is not ill-appearing or diaphoretic.  HENT:     Head: Normocephalic and atraumatic.  Eyes:     Conjunctiva/sclera: Conjunctivae normal.  Cardiovascular:     Rate and Rhythm: Normal rate and regular rhythm.     Pulses: Normal pulses.  Pulmonary:     Effort: Pulmonary effort is normal.     Breath sounds: Normal breath sounds. No wheezing, rhonchi or rales.  Abdominal:     Palpations: Abdomen is soft.     Tenderness: There is no abdominal tenderness.  Musculoskeletal:     Cervical back: Neck supple.     Comments: See photo below. R foot swollen compared to L foot with 2 large blisters to the base of the R great toe. Erythema is also noted to the foot with increased warmth and TTP. Cap refill < 2 seconds. 2+ DP pulse.   Skin:    General: Skin is warm and dry.  Neurological:     Mental Status: She is alert.        ED Results / Procedures / Treatments   Labs (all labs ordered are listed, but only abnormal results are displayed) Labs Reviewed  COMPREHENSIVE METABOLIC PANEL - Abnormal; Notable for the following components:      Result Value   Calcium 8.6 (*)    Albumin 3.1 (*)    Alkaline Phosphatase 127 (*)    All other components within normal limits  CBC WITH DIFFERENTIAL/PLATELET - Abnormal; Notable for the following components:   WBC 2.9 (*)    Platelets 96 (*)    Neutro Abs 1.6 (*)    All other components within normal limits  LACTIC ACID, PLASMA  LACTIC ACID, PLASMA    EKG None  Radiology DG Foot Complete Right  Result Date: 08/04/2020 CLINICAL DATA:  Foot pain after dropping a 50 pound  bucket of pickles. EXAM: RIGHT FOOT COMPLETE - 3+ VIEW COMPARISON:  None. FINDINGS: There is a comminuted fracture of the first digit proximal phalanx that extends to the interphalangeal joint. No evidence of dislocation. Fixation hardware is seen in the distal tibia and fibula. Small plantar and posterior calcaneal enthesophytes are noted. Two large soft tissue densities overlying the first  digit likely represent blisters. IMPRESSION: Comminuted fracture of the first digit proximal phalanx extends to the interphalangeal joint. Electronically Signed   By: Zerita Boers M.D.   On: 08/04/2020 16:51    Procedures Procedures (including critical care time)  Medications Ordered in ED Medications  Tdap (BOOSTRIX) injection 0.5 mL (0.5 mLs Intramuscular Given 08/04/20 1629)    ED Course  I have reviewed the triage vital signs and the nursing notes.  Pertinent labs & imaging results that were available during my care of the patient were reviewed by me and considered in my medical decision making (see chart for details).    MDM Rules/Calculators/A&P                          64 year old female presenting to the ED today with complaint of blisters to her R great toe after dropping a 50 pound bucket of pickles onto the foot 6 days ago. Pt is presenting today as the blisters have gotten bigger and her foot is now warm to the touch causing concern. No fevers. On arrival to the ED VSS - pt is afebrile, nontachycardic, and nontachypneic. She appears to be in NAD. Labs were obtained while in the waiting room however no xray was obtained of her foot. Labs unremarkable; WBC 2.9 and platelets 96 which appears to be pt's baseline. Hx of cirrhosis. CMP with alk phose 127 no other findings. Lactic acid WNL 1.6.   WBC  Date Value Ref Range Status  08/04/2020 2.9 (L) 4.0 - 10.5 K/uL Final  06/01/2020 2.6 (L) 3.8 - 10.8 Thousand/uL Final  04/02/2020 3.1 (L) 3.8 - 10.8 Thousand/uL Final  12/15/2019 2.6 (L) 4.0 - 10.5  K/uL Final   WBC Count  Date Value Ref Range Status  01/11/2020 2.3 (L) 4.0 - 10.5 K/uL Final   On exam pt has 2 large blisters to the base of her G toe with surrounding TTP. She has erythema to the foot as well with increased warmth. Will plan for xray at this time to assess for fractures or osteo. No history of DM however.   Xray with findings of fracture to the proximal phalanx of the first toe. Pt with photos on Day 1 of incident without any open wounds to suggest open fracture at this time. The blisters have progressively been growing since Day 2 of the incident. I do not feel the blisters need to be opened today and discussed this with attending physician Dr. Jeanell Sparrow who agrees. Will plan to provide antibiotics to help cover for infection, post op shoe for patient given fracture, and ortho follow up. Pt reports she has a walker at home that she wound rather use vs crutches; feel this is reasonable today. She is in agreement with plan and stable for discharge home.   This note was prepared using Dragon voice recognition software and may include unintentional dictation errors due to the inherent limitations of voice recognition software.   Final Clinical Impression(s) / ED Diagnoses Final diagnoses:  Closed displaced fracture of proximal phalanx of right great toe, initial encounter  Blister (nonthermal), right great toe, initial encounter    Rx / DC Orders ED Discharge Orders         Ordered    cephALEXin (KEFLEX) 500 MG capsule  4 times daily        08/04/20 1743           Discharge Instructions     Please  pick up antibiotic and take as prescribed.  You can soak your foot in epsom salts and keep blisters clean and dry. They will likely pop on their own; I would not recommend popping them yourself.  Your xray showed a fracture of your great toe; please wear post op shoe and follow up with Dr. Doreatha Martin orthopedist for further recommendations While at home please rest, ice, and  elevate your foot to help reduce pain/swelling Take Ibuprofen and Tylenol as needed for pain Return to the ED for any worsening symptoms including worsening swelling/redness of your foot, fevers > 100.4, chills, or any other new/worsening symptoms       Eustaquio Maize, PA-C 08/04/20 1745    Pattricia Boss, MD 08/04/20 2026

## 2020-08-04 NOTE — ED Notes (Signed)
E-signature pad unavailable at time of pt discharge. This RN discussed discharge materials with pt and answered all pt questions. Pt stated understanding of discharge material. ? ?

## 2020-08-04 NOTE — ED Triage Notes (Signed)
Pt states she dropped a 50 lb bucket of pickles on her L toe on Monday.  C/o pain and redness to area with 2 large blisters.

## 2020-08-16 ENCOUNTER — Other Ambulatory Visit: Payer: Self-pay

## 2020-08-16 ENCOUNTER — Ambulatory Visit: Payer: Medicare HMO | Admitting: Family

## 2020-08-16 ENCOUNTER — Encounter: Payer: Self-pay | Admitting: Family

## 2020-08-16 VITALS — BP 146/97 | HR 69 | Temp 98.0°F | Wt 136.0 lb

## 2020-08-16 DIAGNOSIS — K7469 Other cirrhosis of liver: Secondary | ICD-10-CM

## 2020-08-16 DIAGNOSIS — B182 Chronic viral hepatitis C: Secondary | ICD-10-CM

## 2020-08-16 NOTE — Patient Instructions (Signed)
Nice to see you.  No additional medication is needed at this time.  We will check your lab work today.  Plan for follow-up in 3 months or sooner if needed with lab work 1 to 2 weeks prior to appointment.  Have a great day and stay safe!

## 2020-08-16 NOTE — Progress Notes (Signed)
Subjective:    Patient ID: Kelly Singh, female    DOB: 05-08-1956, 64 y.o.   MRN: [id number]  Chief Complaint  Patient presents with  . Follow-up    Harvoni last dose 11/10     HPI:  Kelly Singh is a 64 y.o. female with genotype 1 a chronic hepatitis C with initial viral load of 2 million and complicated by decompensated cirrhosis last seen in the office on 04/02/2020 with good adherence and tolerance to Harvoni.  Most recent lab work completed on 8/27 with hepatitis C RNA level that was undetectable.  Here today for end of treatment visit having completed 24 weeks.  Kelly Singh has been taking her Harvoni as prescribed with no adverse side effects or missed doses since her last office visit.  She completed her medication yesterday 11/10.  Currently asymptomatic with no nausea, vomiting, scleral icterus, or jaundice.  Declines hepatitis B vaccine.   Allergies  Allergen Reactions  . Adhesive [Tape] Other (See Comments)    Plastic tape eats through skin - pls use paper tape  . Latex Other (See Comments)    Eats through skin      Outpatient Medications Prior to Visit  Medication Sig Dispense Refill  . sertraline (ZOLOFT) 50 MG tablet Take 50 mg by mouth every morning.    . Vitamin D, Cholecalciferol, 25 MCG (1000 UT) TABS Take by mouth.    . baclofen (LIORESAL) 20 MG tablet Take 20 mg by mouth 3 (three) times daily. (Patient not taking: Reported on 08/16/2020)    . cephALEXin (KEFLEX) 500 MG capsule Take 1 capsule (500 mg total) by mouth 4 (four) times daily. (Patient not taking: Reported on 08/16/2020) 20 capsule 0  . HYDROcodone-acetaminophen (NORCO) 10-325 MG tablet Take 1 tablet by mouth every 4 (four) hours as needed (pain).    . Ledipasvir-Sofosbuvir (HARVONI) 90-400 MG TABS Take 1 tablet by mouth daily. (Patient not taking: Reported on 08/16/2020) 28 tablet 5  . Multiple Vitamin (MULTIVITAMIN WITH MINERALS) TABS tablet Take 1 tablet by mouth daily. (Patient not  taking: Reported on 08/16/2020)    . NARCAN 4 MG/0.1ML LIQD nasal spray kit as needed. (Patient not taking: Reported on 04/11/2020)    . OVER THE COUNTER MEDICATION Take 1 tablet by mouth daily. Keto (Patient not taking: Reported on 08/16/2020)    . Specialty Vitamins Products (BIOTIN PLUS KERATIN PO) Take 1 tablet by mouth daily. (Patient not taking: Reported on 08/16/2020)     No facility-administered medications prior to visit.     Past Medical History:  Diagnosis Date  . Arthritis   . Ataxia   . Cirrhosis ([place])   . Depression with anxiety   . Fibromyalgia   . Gallbladder sludge   . Hiatal hernia   . Migraine   . Substance abuse ([place])    in 1970's     Past Surgical History:  Procedure Laterality Date  . ANKLE SURGERY Right 2011  . BREAST ENHANCEMENT SURGERY  1996  . TUBAL LIGATION  1979  . WRIST SURGERY Right 2016       Review of Systems  Constitutional: Negative for chills, fatigue, fever and unexpected weight change.  Respiratory: Negative for cough, chest tightness, shortness of breath and wheezing.   Cardiovascular: Negative for chest pain and leg swelling.  Gastrointestinal: Negative for abdominal distention, constipation, diarrhea, nausea and vomiting.  Neurological: Negative for dizziness, weakness, light-headedness and headaches.  Hematological: Does not bruise/bleed easily.      Objective:  BP (!) 146/97   Pulse 69   Temp 98 F (36.7 C) (Oral)   Wt 136 lb (61.7 kg)   BMI 21.95 kg/m  Nursing note and vital signs reviewed.  Physical Exam Constitutional:      General: She is not in acute distress.    Appearance: She is well-developed.  Cardiovascular:     Rate and Rhythm: Normal rate and regular rhythm.     Heart sounds: Normal heart sounds. No murmur heard.  No friction rub. No gallop.   Pulmonary:     Effort: Pulmonary effort is normal. No respiratory distress.     Breath sounds: Normal breath sounds. No wheezing or rales.  Chest:      Chest wall: No tenderness.  Abdominal:     General: Bowel sounds are normal. There is no distension.     Palpations: Abdomen is soft. There is no mass.     Tenderness: There is no abdominal tenderness. There is no guarding or rebound.  Skin:    General: Skin is warm and dry.  Neurological:     Mental Status: She is alert and oriented to person, place, and time.  Psychiatric:        Behavior: Behavior normal.        Thought Content: Thought content normal.        Judgment: Judgment normal.      Depression screen PHQ 2/9 12/05/2019  Decreased Interest 0  Down, Depressed, Hopeless 0  PHQ - 2 Score 0       Assessment & Plan:    Patient Active Problem List   Diagnosis Date Noted  . Slurred speech 03/08/2020  . Other cirrhosis of liver ([place]) 12/05/2019  . Dizzy 11/09/2019  . Fall 11/09/2019  . Hypomagnesemia 11/09/2019  . Scalp laceration 11/09/2019  . Alcohol intoxication ([place]) 11/09/2019  . Hypokalemia   . Gait abnormality 11/03/2019  . Chronic hepatitis C without hepatic coma ([place]) 11/01/2019     Problem List Items Addressed This Visit      Digestive   Chronic hepatitis C without hepatic coma ([place]) - Primary    Kelly Singh has completed 24 weeks of Harvoni for genotype 1a chronic hepatitis C with initial viral load of 2 million and complicated by decompensated cirrhosis.  Most recent lab work with undetectable viral load.  She remains asymptomatic.  Check blood work today.  Plan for follow-up in 3 months or sooner if needed for sustained viremic response x12 weeks.  Continue with cirrhosis care per gastroenterology.      Relevant Orders   COMPLETE METABOLIC PANEL WITH GFR   Hepatitis C RNA quantitative   Hepatitis C RNA quantitative   Other cirrhosis of liver (HCC)   Relevant Orders   COMPLETE METABOLIC PANEL WITH GFR   Hepatitis C RNA quantitative   Hepatitis C RNA quantitative       I have discontinued Kelly Singh "Kelly Singh"'s HYDROcodone-acetaminophen,  multivitamin with minerals, baclofen, Specialty Vitamins Products (BIOTIN PLUS KERATIN PO), OVER THE COUNTER MEDICATION, Ledipasvir-Sofosbuvir, Narcan, and cephALEXin. I am also having her maintain her sertraline and Vitamin D (Cholecalciferol).   Follow-up: Return in about 3 months (around 11/16/2020).   [name], MSN, FNP-C Nurse Practitioner [facility] [place] number: [phone]

## 2020-08-16 NOTE — Assessment & Plan Note (Signed)
Kelly Singh has completed 24 weeks of Harvoni for genotype 1a chronic hepatitis C with initial viral load of 2 million and complicated by decompensated cirrhosis.  Most recent lab work with undetectable viral load.  She remains asymptomatic.  Check blood work today.  Plan for follow-up in 3 months or sooner if needed for sustained viremic response x12 weeks.  Continue with cirrhosis care per gastroenterology.

## 2020-08-20 LAB — COMPLETE METABOLIC PANEL WITH GFR
AG Ratio: 1.1 (calc) (ref 1.0–2.5)
ALT: 20 U/L (ref 6–29)
AST: 23 U/L (ref 10–35)
Albumin: 3.5 g/dL — ABNORMAL LOW (ref 3.6–5.1)
Alkaline phosphatase (APISO): 134 U/L (ref 37–153)
BUN: 15 mg/dL (ref 7–25)
CO2: 31 mmol/L (ref 20–32)
Calcium: 8.8 mg/dL (ref 8.6–10.4)
Chloride: 105 mmol/L (ref 98–110)
Creat: 0.71 mg/dL (ref 0.50–0.99)
GFR, Est African American: 104 mL/min/{1.73_m2} (ref >=60)
GFR, Est Non African American: 90 mL/min/{1.73_m2} (ref >=60)
Globulin: 3.3 g/dL (calc) (ref 1.9–3.7)
Glucose, Bld: 99 mg/dL (ref 65–99)
Potassium: 4.1 mmol/L (ref 3.5–5.3)
Sodium: 140 mmol/L (ref 135–146)
Total Bilirubin: 0.7 mg/dL (ref 0.2–1.2)
Total Protein: 6.8 g/dL (ref 6.1–8.1)

## 2020-08-20 LAB — HEPATITIS C RNA QUANTITATIVE
HCV RNA, PCR, QN (Log): <1.18 log IU/mL
HCV RNA, PCR, QN: <15 IU/mL

## 2020-08-22 DIAGNOSIS — H5203 Hypermetropia, bilateral: Secondary | ICD-10-CM | POA: Diagnosis not present

## 2020-08-22 DIAGNOSIS — Z0100 Encounter for examination of eyes and vision without abnormal findings: Secondary | ICD-10-CM | POA: Diagnosis not present

## 2020-08-27 NOTE — Progress Notes (Signed)
I have reviewed and agreed above plan. 

## 2020-09-13 ENCOUNTER — Other Ambulatory Visit: Payer: Self-pay

## 2020-09-13 ENCOUNTER — Ambulatory Visit: Payer: Medicare HMO | Admitting: Neurology

## 2020-09-13 ENCOUNTER — Encounter: Payer: Self-pay | Admitting: Neurology

## 2020-09-13 VITALS — BP 153/80 | HR 66 | Ht 66.0 in | Wt 135.5 lb

## 2020-09-13 DIAGNOSIS — R4781 Slurred speech: Secondary | ICD-10-CM | POA: Diagnosis not present

## 2020-09-13 DIAGNOSIS — M545 Low back pain, unspecified: Secondary | ICD-10-CM | POA: Diagnosis not present

## 2020-09-13 DIAGNOSIS — R269 Unspecified abnormalities of gait and mobility: Secondary | ICD-10-CM

## 2020-09-13 DIAGNOSIS — G8929 Other chronic pain: Secondary | ICD-10-CM | POA: Insufficient documentation

## 2020-09-13 DIAGNOSIS — G3281 Cerebellar ataxia in diseases classified elsewhere: Secondary | ICD-10-CM | POA: Diagnosis not present

## 2020-09-13 MED ORDER — DULOXETINE HCL 60 MG PO CPEP: 60.0000 mg | ORAL_CAPSULE | Freq: Every day | ORAL | 12 refills | Status: DC

## 2020-09-13 MED ORDER — GABAPENTIN 100 MG PO CAPS: 100.0000 mg | ORAL_CAPSULE | Freq: Three times a day (TID) | ORAL | 11 refills | Status: DC

## 2020-09-13 NOTE — Progress Notes (Signed)
HISTORY OF PRESENT ILLNESS: Kelly Singh is a 64 year old female, seen in request by her primary care physician Dr. Arline Asp, Mitzi Hansen for evaluation of speech difficulty, gait abnormality.  Initial evaluation was on November 03, 2019.  I have reviewed and summarized the referring note from the referring physician.  She has past medical history of depression, migraine headache, chronic low back pain  Around 2001, she first noticed problem with walking, she did not previously have any balance problem, was still riding a motorcycle, then began to notice unsteady gait, getting off path while walking, then began to stumble, and fall, she often tripped on obstacles, in 2001, she had a fall, resulting ankle fracture, her balance then progressively worsened, then noticed difficulty even standing still,  Since 2018, she noticed mild slurred speech, denies trouble swallowing, mild trouble with hand coordination, she denies tremor  She previously lived in [place], had extensive evaluation at that time, then moved to [place] for couple years, was evaluated by movement disorder center Dr, Thermon Leyland on November 02, 2017, she was given the diagnosis of 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, and slow progression of motor symptoms, she was given a trial of carbidopa-levodopa 25/100 mg to 1 tablet 3 times a day, she developed nausea, there was no significant improvement, she was later referred to physical therapy,  She moved to [facility] since October 2020, is referred here to establish neurological care,  She denied a family history of gait problem,  Laboratory evaluations in January 2021 showed mild elevated alkaline phosphate 146, total bilirubin of 2.1, albumin was decreased 3.3, mild elevated AST 68, ALT 63, calcium was decreased 8.0  Nonreactive hepatitis B surface antibody, surface antigen, negative hepatitis C, elevated kappa free light chain 29.7, lambda light chain ratio 1.87  UPDATE Sep 13 2020: She came in without any assistant, complains of slow worsening slurred speech, gait abnormality, denies significant pain  She complains of leg muscle cramps in her leg and feet, also complains of low back pain, radiating pain to left hip, left leg give out underneath her sometimes, she also complained frequent bilateral lower extremity, leg and feet muscle cramps, especially at nighttime, she also complains of intermittent numbness at bilateral shin, but denies bilateral feet paresthesia, she has urinary urgency, occasionally incontinence since 2020,  Reported abnormal x-ray of lumbar spine, showed degenerative changes, but I do not have the report  She has been treated for hepatitis C, 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  Walking difficulty.  ALLERGIES: Allergies  Allergen Reactions   Adhesive [Tape] Other (See Comments)    Plastic tape eats through skin - pls use paper tape   Latex Other (See Comments)    Eats through skin    HOME MEDICATIONS: Outpatient Medications Prior to Visit  Medication Sig Dispense Refill   HYDROcodone-acetaminophen (NORCO) 10-325 MG tablet Take 1 tablet by mouth every 4 (four) hours as needed.     sertraline (ZOLOFT) 50 MG tablet Take 50 mg by mouth every morning.     Vitamin D, Cholecalciferol, 25 MCG (1000 UT) TABS Take by mouth.     baclofen (LIORESAL) 10 MG tablet Take 1 tablet by mouth 3 (three) times daily as needed.     No facility-administered medications prior to visit.    PAST MEDICAL HISTORY: Past Medical History:  Diagnosis Date   Arthritis    Ataxia    Cirrhosis ([place])    Depression with anxiety    Fibromyalgia    Gallbladder sludge    Hiatal hernia    Migraine    Substance abuse ([place])    in 1970's    PAST  SURGICAL HISTORY: Past Surgical History:  Procedure Laterality Date   ANKLE SURGERY Right 2011   BREAST ENHANCEMENT SURGERY  1996   TUBAL LIGATION  1979   WRIST SURGERY Right 2016    FAMILY HISTORY: Family History  Problem Relation Age of Onset   Bone cancer Mother    Esophageal cancer Father     SOCIAL HISTORY: Social History   Socioeconomic History   Marital status: Single    Spouse name: Not on file   Number of children: 1   Years of education: college   Highest education level: Associate degree: occupational, Hotel manager, or vocational program  Occupational History   Occupation: prep food  Tobacco Use   Smoking status: Former Smoker    Quit date: 1998    Years since quitting: 23.9   Smokeless tobacco: Never Used  Scientific laboratory technician Use: Never used  Substance and Sexual Activity   Alcohol use: Yes    Comment: occasional   Drug use: Never   Sexual activity: Not on file  Other Topics Concern   Not on file  Social History Narrative   Lives at home with her granddaughter.   Left-handed.   Caffeine use:c 1-2 cups per day.   Social Determinants of Health   Financial Resource Strain: Not on file  Food Insecurity: Not on file  Transportation Needs: Not on file  Physical Activity: Not on file  Stress: Not on file  Social Connections: Not on file  Intimate Partner Violence: Not on file   PHYSICAL EXAM  Vitals:   09/13/20 1445  BP: (!) 153/80  Pulse: 66  Weight: 135 lb 8 oz (61.5 kg)  Height: 5\' 6"  (1.676 m)   Body mass index is 21.87 kg/m.  Generalized: Well developed, in no acute distress   Neurological examination  Mentation: Alert oriented to time, place, history taking. Follows all commands, speech is slightly slurred, voice somewhat shaky Cranial nerve II-XII: Pupils were equal round reactive to light. Extraocular movements were full, visual field were full on confrontational test. Facial sensation and strength were normal.  Head  turning and shoulder shrug  were normal and symmetric. Motor: The motor testing reveals 5 over 5 strength of all 4 extremities. Good symmetric motor tone is noted throughout.  Sensory: Withdraws to pain Coordination: There was mild finger-to-nose, heel-to-shin dysmetria Gait and station: Has to push off to stand, gait is wide-based, unsteady, tends drag left foot Reflexes: Deep tendon reflexes are symmetric and normal bilaterally.   DIAGNOSTIC DATA (LABS, IMAGING, TESTING) - I reviewed patient records, labs, notes, testing and imaging myself where available.  Lab Results  Component Value Date   WBC 2.9 (L) 08/04/2020   HGB 12.9 08/04/2020   HCT 40.1 08/04/2020   MCV 96.4 08/04/2020   PLT 96 (L) 08/04/2020      Component Value Date/Time   NA 140 08/16/2020 1007   K 4.1 08/16/2020 1007   CL 105 08/16/2020 1007   CO2 31 08/16/2020 1007   GLUCOSE 99 08/16/2020 1007   BUN 15 08/16/2020 1007   CREATININE 0.71 08/16/2020 1007   CALCIUM 8.8 08/16/2020 1007   PROT 6.8 08/16/2020 1007  ALBUMIN 3.1 (L) 08/04/2020 1411   AST 23 08/16/2020 1007   AST 49 (H) 01/11/2020 0815   ALT 20 08/16/2020 1007   ALT 37 01/11/2020 0815   ALT 63 (H) 11/01/2019 0955   ALKPHOS 127 (H) 08/04/2020 1411   BILITOT 0.7 08/16/2020 1007   BILITOT 1.0 01/11/2020 0815   GFRNONAA 90 08/16/2020 1007   GFRAA 104 08/16/2020 1007   No results found for: CHOL, HDL, LDLCALC, LDLDIRECT, TRIG, CHOLHDL No results found for: HGBA1C Lab Results  Component Value Date   VITAMINB12 896 11/03/2019   Lab Results  Component Value Date   TSH 2.300 11/03/2019      ASSESSMENT AND PLAN 64 y.o. 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 Ph.D.  [facility] [place],  [address] Phone: [phone] Fax:      [phone]

## 2020-09-17 ENCOUNTER — Telehealth: Payer: Self-pay | Admitting: Neurology

## 2020-09-17 NOTE — Telephone Encounter (Signed)
[employer] pending  

## 2020-09-18 NOTE — Telephone Encounter (Signed)
Mcarthur Rossetti Josem Kaufmann: [id number] (exp. 09/17/20 to 10/17/20) order sent to GI. They will reach out to the patient to schedule.

## 2020-10-01 ENCOUNTER — Other Ambulatory Visit: Payer: Medicare HMO

## 2020-10-11 DIAGNOSIS — E782 Mixed hyperlipidemia: Secondary | ICD-10-CM | POA: Diagnosis not present

## 2020-10-11 DIAGNOSIS — E559 Vitamin D deficiency, unspecified: Secondary | ICD-10-CM | POA: Diagnosis not present

## 2020-10-18 ENCOUNTER — Other Ambulatory Visit: Payer: Self-pay

## 2020-10-18 DIAGNOSIS — D472 Monoclonal gammopathy: Secondary | ICD-10-CM

## 2020-10-19 ENCOUNTER — Inpatient Hospital Stay: Payer: Medicare HMO | Attending: Internal Medicine

## 2020-10-23 ENCOUNTER — Other Ambulatory Visit: Payer: Medicare HMO

## 2020-10-26 ENCOUNTER — Inpatient Hospital Stay: Payer: Medicare HMO | Admitting: Oncology

## 2020-11-08 ENCOUNTER — Other Ambulatory Visit: Payer: Self-pay

## 2020-11-08 DIAGNOSIS — B182 Chronic viral hepatitis C: Secondary | ICD-10-CM

## 2020-11-16 ENCOUNTER — Other Ambulatory Visit: Payer: Medicare HMO

## 2020-11-16 ENCOUNTER — Other Ambulatory Visit: Payer: Self-pay

## 2020-11-16 DIAGNOSIS — B182 Chronic viral hepatitis C: Secondary | ICD-10-CM | POA: Diagnosis not present

## 2020-11-18 LAB — COMPLETE METABOLIC PANEL WITH GFR
AG Ratio: 1.2 (calc) (ref 1.0–2.5)
ALT: 12 U/L (ref 6–29)
AST: 19 U/L (ref 10–35)
Albumin: 3.8 g/dL (ref 3.6–5.1)
Alkaline phosphatase (APISO): 115 U/L (ref 37–153)
BUN: 10 mg/dL (ref 7–25)
CO2: 31 mmol/L (ref 20–32)
Calcium: 8.7 mg/dL (ref 8.6–10.4)
Chloride: 105 mmol/L (ref 98–110)
Creat: 0.6 mg/dL (ref 0.50–0.99)
GFR, Est African American: 112 mL/min/{1.73_m2} (ref >=60)
GFR, Est Non African American: 96 mL/min/{1.73_m2} (ref >=60)
Globulin: 3.1 g/dL (calc) (ref 1.9–3.7)
Glucose, Bld: 97 mg/dL (ref 65–99)
Potassium: 4.6 mmol/L (ref 3.5–5.3)
Sodium: 140 mmol/L (ref 135–146)
Total Bilirubin: 1 mg/dL (ref 0.2–1.2)
Total Protein: 6.9 g/dL (ref 6.1–8.1)

## 2020-11-18 LAB — HEPATITIS C RNA QUANTITATIVE
HCV Quantitative Log: <1.18 log IU/mL
HCV RNA, PCR, QN: <15 IU/mL

## 2020-11-30 ENCOUNTER — Telehealth: Payer: Self-pay

## 2020-11-30 ENCOUNTER — Encounter: Payer: Self-pay | Admitting: Family

## 2020-11-30 ENCOUNTER — Telehealth (INDEPENDENT_AMBULATORY_CARE_PROVIDER_SITE_OTHER): Payer: Medicare HMO | Admitting: Family

## 2020-11-30 ENCOUNTER — Other Ambulatory Visit: Payer: Self-pay

## 2020-11-30 DIAGNOSIS — B182 Chronic viral hepatitis C: Secondary | ICD-10-CM | POA: Diagnosis not present

## 2020-11-30 NOTE — Assessment & Plan Note (Signed)
Ms. Bostic has completed 24 weeks of Harvoni for genotype Ia chronic hepatitis C complicated by decompensated cirrhosis.  Hepatitis C RNA level undetectable indicating cure for hepatitis C.  No further treatment is necessary at this time.  Discussed the need for continued follow-up for cirrhosis with Dr. Hilarie Fredrickson as well as routine hepatocellular carcinoma screenings every 6 to 12 months through ultrasound with or without alpha fetoprotein.  Discussed if hepatitis C screening is needed in the future will need to check hepatitis C RNA level as hepatitis C antibody will always be positive.  No additional follow-up with ID necessary at this time.

## 2020-11-30 NOTE — Progress Notes (Signed)
Subjective:    Patient ID: Kelly Singh, female    DOB: 21-Jul-1956, 65 y.o.   MRN: [id number]  Chief Complaint  Patient presents with  . Follow-up     Virtual Visit via Telephone/Video Note   I connected with Ms, Singh on 11/30/2020 at 10:15 am by telephone and verified that I am speaking with the correct person using two identifiers.   I discussed the limitations, risks, security and privacy concerns of performing an evaluation and management service by telephone and the availability of in person appointments. I also discussed with the patient that there may be a patient responsible charge related to this service. The patient expressed understanding and agreed to proceed.  Location:  Patient: Home Provider: [facility]   HPI:  Kelly Singh is a 65 y.o. female with genotype 1 a chronic hepatitis C with initial viral load of 2 million and complicated by decompensated cirrhosis last seen on 08/16/2020 at the end of her treatment having completed 24 weeks of Harvoni.  Lab work results from 11/16/2020 with hepatitis C RNA level that is undetectable and kidney function, liver function, electrolytes within normal ranges.  Telehealth visit today for care visit.  Kelly Singh has been doing well since last seen in the office with no current symptoms including abdominal pain, nausea, vomiting, scleral icterus, or jaundice. Not currently on medication. Gastroenterologist is Dr. Hilarie Fredrickson.    Allergies  Allergen Reactions  . Adhesive [Tape] Other (See Comments)    Plastic tape eats through skin - pls use paper tape  . Latex Other (See Comments)    Eats through skin      Outpatient Medications Prior to Visit  Medication Sig Dispense Refill  . DULoxetine (CYMBALTA) 60 MG capsule Take 1 capsule (60 mg total) by mouth daily. 30 capsule 12  . gabapentin (NEURONTIN) 100 MG capsule Take 1 capsule (100 mg total) by mouth 3 (three) times daily. 90 capsule 11  . HYDROcodone-acetaminophen  (NORCO) 10-325 MG tablet Take 1 tablet by mouth every 4 (four) hours as needed.    . sertraline (ZOLOFT) 50 MG tablet Take 50 mg by mouth every morning.    . Vitamin D, Cholecalciferol, 25 MCG (1000 UT) TABS Take by mouth.     No facility-administered medications prior to visit.     Past Medical History:  Diagnosis Date  . Arthritis   . Ataxia   . Cirrhosis ([place])   . Depression with anxiety   . Fibromyalgia   . Gallbladder sludge   . Hiatal hernia   . Migraine   . Substance abuse ([place])    in 1970's     Past Surgical History:  Procedure Laterality Date  . ANKLE SURGERY Right 2011  . BREAST ENHANCEMENT SURGERY  1996  . TUBAL LIGATION  1979  . WRIST SURGERY Right 2016       Review of Systems  Constitutional: Negative for chills, fatigue, fever and unexpected weight change.  Respiratory: Negative for cough, chest tightness, shortness of breath and wheezing.   Cardiovascular: Negative for chest pain and leg swelling.  Gastrointestinal: Negative for abdominal distention, constipation, diarrhea, nausea and vomiting.  Neurological: Negative for dizziness, weakness, light-headedness and headaches.  Hematological: Does not bruise/bleed easily.      Objective:    Nursing note and vital signs reviewed.    [name] is pleasant to speak with and sounds to be in no apparent distress.  Physical exam limited secondary to telehealth visit.   Assessment & Plan:  Problem List Items Addressed This Visit      Digestive   Chronic hepatitis C without hepatic coma [facility])    [name] has completed 24 weeks of Harvoni for genotype Ia chronic hepatitis C complicated by decompensated cirrhosis.  Hepatitis C RNA level undetectable indicating cure for hepatitis C.  No further treatment is necessary at this time.  Discussed the need for continued follow-up for cirrhosis with Dr. Hilarie Fredrickson as well as routine hepatocellular carcinoma screenings every 6 to 12 months through ultrasound with  or without alpha fetoprotein.  Discussed if hepatitis C screening is needed in the future will need to check hepatitis C RNA level as hepatitis C antibody will always be positive.  No additional follow-up with ID necessary at this time.          I am having Kelly Singh "[name]" maintain her sertraline, Vitamin D (Cholecalciferol), HYDROcodone-acetaminophen, DULoxetine, and gabapentin.   I discussed the assessment and treatment plan with the patient. The patient was provided an opportunity to ask questions and all were answered. The patient agreed with the plan and demonstrated an understanding of the instructions.   The patient was advised to call back or seek an in-person evaluation if the symptoms worsen or if the condition fails to improve as anticipated.   I provided  8 minutes of non-face-to-face time during this encounter.  Follow-up: As needed   [name], MSN, FNP-C Nurse Practitioner [facility] [place] number: [phone]

## 2020-11-30 NOTE — Telephone Encounter (Signed)
Received call in triage, patient states she was not aware of her appointment today. Patient had already had lab work drawn. Offered patient phone visit for same time slot and patient accepts. Kelly Singh

## 2020-11-30 NOTE — Patient Instructions (Signed)
Nice to speak with you.  Your hepatitis C is cured.  No further treatment is necessary.  Continue to follow-up with gastroenterology/primary care for routine liver cancer screenings every 6 to 12 months  Follow-up with ID as needed.

## 2020-12-07 ENCOUNTER — Other Ambulatory Visit: Payer: Self-pay

## 2020-12-07 ENCOUNTER — Inpatient Hospital Stay: Payer: Medicare HMO | Attending: Internal Medicine

## 2020-12-07 DIAGNOSIS — Z888 Allergy status to other drugs, medicaments and biological substances status: Secondary | ICD-10-CM | POA: Insufficient documentation

## 2020-12-07 DIAGNOSIS — K7460 Unspecified cirrhosis of liver: Secondary | ICD-10-CM | POA: Insufficient documentation

## 2020-12-07 DIAGNOSIS — R768 Other specified abnormal immunological findings in serum: Secondary | ICD-10-CM | POA: Diagnosis not present

## 2020-12-07 DIAGNOSIS — D6959 Other secondary thrombocytopenia: Secondary | ICD-10-CM | POA: Insufficient documentation

## 2020-12-07 DIAGNOSIS — Z79899 Other long term (current) drug therapy: Secondary | ICD-10-CM | POA: Insufficient documentation

## 2020-12-07 DIAGNOSIS — D472 Monoclonal gammopathy: Secondary | ICD-10-CM

## 2020-12-07 LAB — CBC WITH DIFFERENTIAL (CANCER CENTER ONLY)
Abs Immature Granulocytes: 0 10*3/uL (ref 0.00–0.07)
Basophils Absolute: 0 10*3/uL (ref 0.0–0.1)
Basophils Relative: 1 %
Eosinophils Absolute: 0.1 10*3/uL (ref 0.0–0.5)
Eosinophils Relative: 4 %
HCT: 39.9 % (ref 36.0–46.0)
Hemoglobin: 13.1 g/dL (ref 12.0–15.0)
Immature Granulocytes: 0 %
Lymphocytes Relative: 35 %
Lymphs Abs: 1.2 10*3/uL (ref 0.7–4.0)
MCH: 32.3 pg (ref 26.0–34.0)
MCHC: 32.8 g/dL (ref 30.0–36.0)
MCV: 98.3 fL (ref 80.0–100.0)
Monocytes Absolute: 0.3 10*3/uL (ref 0.1–1.0)
Monocytes Relative: 8 %
Neutro Abs: 1.8 10*3/uL (ref 1.7–7.7)
Neutrophils Relative %: 52 %
Platelet Count: 116 10*3/uL — ABNORMAL LOW (ref 150–400)
RBC: 4.06 MIL/uL (ref 3.87–5.11)
RDW: 13.5 % (ref 11.5–15.5)
WBC Count: 3.4 10*3/uL — ABNORMAL LOW (ref 4.0–10.5)
nRBC: 0 % (ref 0.0–0.2)

## 2020-12-07 LAB — CMP (CANCER CENTER ONLY)
ALT: 14 U/L (ref 0–44)
AST: 20 U/L (ref 15–41)
Albumin: 3.6 g/dL (ref 3.5–5.0)
Alkaline Phosphatase: 150 U/L — ABNORMAL HIGH (ref 38–126)
Anion gap: 5 (ref 5–15)
BUN: 8 mg/dL (ref 8–23)
CO2: 27 mmol/L (ref 22–32)
Calcium: 8.6 mg/dL — ABNORMAL LOW (ref 8.9–10.3)
Chloride: 107 mmol/L (ref 98–111)
Creatinine: 0.67 mg/dL (ref 0.44–1.00)
GFR, Estimated: >60 mL/min (ref >60)
Glucose, Bld: 95 mg/dL (ref 70–99)
Potassium: 4.4 mmol/L (ref 3.5–5.1)
Sodium: 139 mmol/L (ref 135–145)
Total Bilirubin: 0.7 mg/dL (ref 0.3–1.2)
Total Protein: 7.2 g/dL (ref 6.5–8.1)

## 2020-12-10 LAB — KAPPA/LAMBDA LIGHT CHAINS
Kappa free light chain: 19.8 mg/L — ABNORMAL HIGH (ref 3.3–19.4)
Kappa, lambda light chain ratio: 1.47 (ref 0.26–1.65)
Lambda free light chains: 13.5 mg/L (ref 5.7–26.3)

## 2020-12-12 LAB — MULTIPLE MYELOMA PANEL, SERUM
Albumin SerPl Elph-Mcnc: 3.5 g/dL (ref 2.9–4.4)
Albumin/Glob SerPl: 1.2 (ref 0.7–1.7)
Alpha 1: 0.2 g/dL (ref 0.0–0.4)
Alpha2 Glob SerPl Elph-Mcnc: 0.4 g/dL (ref 0.4–1.0)
B-Globulin SerPl Elph-Mcnc: 0.6 g/dL — ABNORMAL LOW (ref 0.7–1.3)
Gamma Glob SerPl Elph-Mcnc: 1.8 g/dL (ref 0.4–1.8)
Globulin, Total: 3 g/dL (ref 2.2–3.9)
IgA: 167 mg/dL (ref 87–352)
IgG (Immunoglobin G), Serum: 1934 mg/dL — ABNORMAL HIGH (ref 586–1602)
IgM (Immunoglobulin M), Srm: 86 mg/dL (ref 26–217)
M Protein SerPl Elph-Mcnc: 0.5 g/dL — ABNORMAL HIGH
Total Protein ELP: 6.5 g/dL (ref 6.0–8.5)

## 2020-12-14 ENCOUNTER — Inpatient Hospital Stay: Payer: Medicare HMO | Admitting: Oncology

## 2020-12-14 ENCOUNTER — Other Ambulatory Visit: Payer: Self-pay

## 2020-12-14 VITALS — BP 154/75 | HR 55 | Temp 97.0°F | Resp 18 | Wt 135.2 lb

## 2020-12-14 DIAGNOSIS — Z888 Allergy status to other drugs, medicaments and biological substances status: Secondary | ICD-10-CM | POA: Diagnosis not present

## 2020-12-14 DIAGNOSIS — K7460 Unspecified cirrhosis of liver: Secondary | ICD-10-CM | POA: Diagnosis not present

## 2020-12-14 DIAGNOSIS — R768 Other specified abnormal immunological findings in serum: Secondary | ICD-10-CM | POA: Diagnosis not present

## 2020-12-14 DIAGNOSIS — Z79899 Other long term (current) drug therapy: Secondary | ICD-10-CM | POA: Diagnosis not present

## 2020-12-14 DIAGNOSIS — D6959 Other secondary thrombocytopenia: Secondary | ICD-10-CM | POA: Diagnosis not present

## 2020-12-14 DIAGNOSIS — D472 Monoclonal gammopathy: Secondary | ICD-10-CM

## 2020-12-14 NOTE — Progress Notes (Signed)
Hematology and Oncology Follow Up Visit  Kelly Singh [id number] [birth year] 65 y.o. 12/14/2020 1:30 PM Otis Brace, MD   Principle Diagnosis: 65 year old woman with elevated IgG kappa biclonal monoclonal gammopathy diagnosed in 2011.  Findings are likely reactive related to her liver disease versus MGUS.   Secondary diagnosis: Thrombocytopenia related to cirrhosis of the liver.   Current therapy: Active surveillance.  Interim History: Ms. Kelly Singh presents today for a follow-up evaluation.  Since the last visit, she reports feeling reasonably well without any major complaints.  He denies any nausea, vomiting or abdominal pain.  She denies any recent hospitalization or illnesses.  She denies any decompensation of her liver disease.      Medications: Updated on review. Current Outpatient Medications  Medication Sig Dispense Refill  . DULoxetine (CYMBALTA) 60 MG capsule Take 1 capsule (60 mg total) by mouth daily. 30 capsule 12  . gabapentin (NEURONTIN) 100 MG capsule Take 1 capsule (100 mg total) by mouth 3 (three) times daily. 90 capsule 11  . HYDROcodone-acetaminophen (NORCO) 10-325 MG tablet Take 1 tablet by mouth every 4 (four) hours as needed.    . sertraline (ZOLOFT) 50 MG tablet Take 50 mg by mouth every morning.    . Vitamin D, Cholecalciferol, 25 MCG (1000 UT) TABS Take by mouth.     No current facility-administered medications for this visit.     Allergies:  Allergies  Allergen Reactions  . Adhesive [Tape] Other (See Comments)    Plastic tape eats through skin - pls use paper tape  . Latex Other (See Comments)    Eats through skin     Physical Exam: Blood pressure (!) 154/75, pulse (!) 55, temperature (!) 97 F (36.1 C), temperature source Tympanic, resp. rate 18, weight 135 lb 3.2 oz (61.3 kg), SpO2 100 %.   ECOG: 1    General appearance: Alert, awake without any distress. Head: Atraumatic without abnormalities Oropharynx: Without  any thrush or ulcers. Eyes: No scleral icterus. Lymph nodes: No lymphadenopathy noted in the cervical, supraclavicular, or axillary nodes Heart:regular rate and rhythm, without any murmurs or gallops.   Lung: Clear to auscultation without any rhonchi, wheezes or dullness to percussion. Abdomin: Soft, nontender without any shifting dullness or ascites. Musculoskeletal: No clubbing or cyanosis. Neurological: No motor or sensory deficits. Skin: No rashes or lesions.      Lab Results: Lab Results  Component Value Date   WBC 3.4 (L) 12/07/2020   HGB 13.1 12/07/2020   HCT 39.9 12/07/2020   MCV 98.3 12/07/2020   PLT 116 (L) 12/07/2020     Chemistry      Component Value Date/Time   NA 139 12/07/2020 1353   K 4.4 12/07/2020 1353   CL 107 12/07/2020 1353   CO2 27 12/07/2020 1353   BUN 8 12/07/2020 1353   CREATININE 0.67 12/07/2020 1353   CREATININE 0.60 11/16/2020 1008      Component Value Date/Time   CALCIUM 8.6 (L) 12/07/2020 1353   ALKPHOS 150 (H) 12/07/2020 1353   AST 20 12/07/2020 1353   ALT 14 12/07/2020 1353   ALT 63 (H) 11/01/2019 0955   BILITOT 0.7 12/07/2020 1353       Results for Grudzien, Kaelan "PAT" (MRN [id number]) as of 12/14/2020 13:32  Ref. Range 12/07/2020 13:53  M Protein SerPl Elph-Mcnc Latest Ref Range: Not Observed g/dL 0.5 (H)  IFE 1 Unknown Comment (A)  Globulin, Total Latest Ref Range: 2.2 - 3.9 g/dL 3.0  B-Globulin SerPl Elph-Mcnc  Latest Ref Range: 0.7 - 1.3 g/dL 0.6 (L)  IgG (Immunoglobin G), Serum Latest Ref Range: 586 - 1,602 mg/dL 1,934 (H)  IgM (Immunoglobulin M), Srm Latest Ref Range: 26 - 217 mg/dL 86  IgA Latest Ref Range: 87 - 352 mg/dL 167     Impression and Plan:  65 year old woman with:  1.  IgG MGUS  kappa biclonal gammopathy detected in 2011.  These findings are related to her liver disease versus a plasma cell disorder including biclonal MGUS.  She is currently on active surveillance without any evidence of symptomatic  multiple myeloma or other plasma cell disorders.  Laboratory data on December 07, 2020 were personally reviewed and discussed with the patient.  Her M protein has decreased down to 0.5 g/dL which indicates a 50% decline over that period of time.  This could be related to her treated liver disease and active hepatitis at that time.  Serum light chains also reviewed and showed slight increase in the kappa free light chain with normal ratio.  At this time I recommended continued active surveillance and repeat laboratory testing in 1 year.  This is unlikely will translate into symptomatic multiple myeloma.   2.  Thrombocytopenia: Due to her cirrhosis of the liver without any further decline.  Platelet count appears adequate without any bleeding complications.  3.  Focal hepatic lesion detected on MRI on in March 2021.  She will continue to follow-up with GI regarding this issue on repeat imaging studies in the future.  4. Follow-up: In 12 months for repeat follow-up.  30  minutes were spent on this encounter.  The time was dedicated to reviewing laboratory data, discussing differential diagnosis and management options for the future.   Zola Button, MD 3/11/20221:30 PM

## 2020-12-18 ENCOUNTER — Telehealth: Payer: Self-pay | Admitting: Oncology

## 2020-12-18 NOTE — Telephone Encounter (Signed)
Left message with follow-up appointment per 3/11 los. Gave option to call back to reschedule if needed.

## 2021-01-21 ENCOUNTER — Encounter (HOSPITAL_BASED_OUTPATIENT_CLINIC_OR_DEPARTMENT_OTHER): Payer: Self-pay | Admitting: Emergency Medicine

## 2021-01-21 ENCOUNTER — Emergency Department (HOSPITAL_BASED_OUTPATIENT_CLINIC_OR_DEPARTMENT_OTHER)
Admission: EM | Admit: 2021-01-21 | Discharge: 2021-01-21 | Disposition: A | Discharge status: Home / Self Care | Payer: Medicare Other | Attending: Emergency Medicine | Admitting: Emergency Medicine

## 2021-01-21 ENCOUNTER — Telehealth: Payer: Self-pay | Admitting: Neurology

## 2021-01-21 ENCOUNTER — Emergency Department (HOSPITAL_BASED_OUTPATIENT_CLINIC_OR_DEPARTMENT_OTHER): Payer: Medicare Other

## 2021-01-21 ENCOUNTER — Other Ambulatory Visit: Payer: Self-pay

## 2021-01-21 DIAGNOSIS — R519 Headache, unspecified: Secondary | ICD-10-CM

## 2021-01-21 DIAGNOSIS — Z91040 Latex allergy status: Secondary | ICD-10-CM | POA: Diagnosis not present

## 2021-01-21 DIAGNOSIS — W19XXXA Unspecified fall, initial encounter: Secondary | ICD-10-CM | POA: Insufficient documentation

## 2021-01-21 DIAGNOSIS — S20211A Contusion of right front wall of thorax, initial encounter: Secondary | ICD-10-CM | POA: Diagnosis not present

## 2021-01-21 DIAGNOSIS — S2001XA Contusion of right breast, initial encounter: Secondary | ICD-10-CM | POA: Diagnosis not present

## 2021-01-21 DIAGNOSIS — Z87891 Personal history of nicotine dependence: Secondary | ICD-10-CM | POA: Diagnosis not present

## 2021-01-21 DIAGNOSIS — S299XXA Unspecified injury of thorax, initial encounter: Secondary | ICD-10-CM | POA: Diagnosis present

## 2021-01-21 DIAGNOSIS — T148XXA Other injury of unspecified body region, initial encounter: Secondary | ICD-10-CM

## 2021-01-21 LAB — CBC WITH DIFFERENTIAL/PLATELET
Abs Immature Granulocytes: 0 10*3/uL (ref 0.00–0.07)
Basophils Absolute: 0 10*3/uL (ref 0.0–0.1)
Basophils Relative: 1 %
Eosinophils Absolute: 0.1 10*3/uL (ref 0.0–0.5)
Eosinophils Relative: 2 %
HCT: 41.7 % (ref 36.0–46.0)
Hemoglobin: 14.2 g/dL (ref 12.0–15.0)
Immature Granulocytes: 0 %
Lymphocytes Relative: 37 %
Lymphs Abs: 1.4 10*3/uL (ref 0.7–4.0)
MCH: 32.2 pg (ref 26.0–34.0)
MCHC: 34.1 g/dL (ref 30.0–36.0)
MCV: 94.6 fL (ref 80.0–100.0)
Monocytes Absolute: 0.3 10*3/uL (ref 0.1–1.0)
Monocytes Relative: 7 %
Neutro Abs: 2 10*3/uL (ref 1.7–7.7)
Neutrophils Relative %: 53 %
Platelets: 105 10*3/uL — ABNORMAL LOW (ref 150–400)
RBC: 4.41 MIL/uL (ref 3.87–5.11)
RDW: 13.2 % (ref 11.5–15.5)
WBC: 3.7 10*3/uL — ABNORMAL LOW (ref 4.0–10.5)
nRBC: 0 % (ref 0.0–0.2)

## 2021-01-21 LAB — COMPREHENSIVE METABOLIC PANEL
ALT: 16 U/L (ref 0–44)
AST: 19 U/L (ref 15–41)
Albumin: 4 g/dL (ref 3.5–5.0)
Alkaline Phosphatase: 119 U/L (ref 38–126)
Anion gap: 7 (ref 5–15)
BUN: 11 mg/dL (ref 8–23)
CO2: 29 mmol/L (ref 22–32)
Calcium: 9 mg/dL (ref 8.9–10.3)
Chloride: 102 mmol/L (ref 98–111)
Creatinine, Ser: 0.53 mg/dL (ref 0.44–1.00)
GFR, Estimated: >60 mL/min (ref >60)
Glucose, Bld: 92 mg/dL (ref 70–99)
Potassium: 3.5 mmol/L (ref 3.5–5.1)
Sodium: 138 mmol/L (ref 135–145)
Total Bilirubin: 0.7 mg/dL (ref 0.3–1.2)
Total Protein: 7.4 g/dL (ref 6.5–8.1)

## 2021-01-21 LAB — URINALYSIS, ROUTINE W REFLEX MICROSCOPIC
Bilirubin Urine: NEGATIVE
Glucose, UA: NEGATIVE mg/dL
Hgb urine dipstick: NEGATIVE
Ketones, ur: NEGATIVE mg/dL
Leukocytes,Ua: NEGATIVE
Nitrite: NEGATIVE
Protein, ur: NEGATIVE mg/dL
Specific Gravity, Urine: 1.015 (ref 1.005–1.030)
pH: 7 (ref 5.0–8.0)

## 2021-01-21 LAB — PROTIME-INR
INR: 1.2 (ref 0.8–1.2)
Prothrombin Time: 15.5 seconds — ABNORMAL HIGH (ref 11.4–15.2)

## 2021-01-21 NOTE — Telephone Encounter (Signed)
[employer] medicare order sent to GI. No auth they will reach out to the patient to schedule.  

## 2021-01-21 NOTE — ED Notes (Signed)
Pt discharged home after verbalizing understanding of discharge instructions; nad noted. 

## 2021-01-21 NOTE — ED Triage Notes (Addendum)
Noticed a bruise to R breast area a few days ago. Denies injury. Also reports ongoing headache from a fall 1 month ago with head injury.

## 2021-01-21 NOTE — Discharge Instructions (Addendum)
1.  Your CT scan of the head does not show any indication of bleeding or trauma to the brain. 2.  The bruise on your chest is consistent with a traumatic bruise that is now healing.  Your platelet levels although historically to the low side, are at your normal baseline.  There does not appear to be additional bruising or bleeding that is atypical for you.  You are safe to follow-up with your hematologist for a scheduled visit.Marland Kitchen

## 2021-01-21 NOTE — ED Provider Notes (Signed)
[facility] Provider Note   CSN: [id number] Arrival date & time: 01/21/21  1526     History Chief Complaint  Patient presents with  . Bleeding/Bruising  . Headache    Kelly Singh is a 65 y.o. female.  HPI Patient fell approximately a month ago.  She had a mechanical fall.  She was trying to work with her wheelbarrow and it got caught on the sidewalk edge causing her to fall as well.  Patient showed me an image of her face from a month ago.  She had a moderately sized about 4 cm hematoma above the left brow and a superficial abrasion to the chin.  These have now resolved.  Patient reports she is had some mild generalized headache since then.  Review of EMR indicates patient has had a chronic and progressive neurologic condition of slurred speech and some gait dysfunction with frequent falls.  This has been closely evaluated and is followed by neurology.  Patient does not report any exacerbation of the underlying condition.  She reports the other thing that was concerning was that a week ago she woke up in the morning and had a fairly large bruise on the anterior aspect of her right breast.  She did not recall any injury that occurred with this.  She reports is really not painful.  Its a little tender right in the area of the bruise but otherwise no other associated symptoms.  She reports she is chronically has some easy bruising of the skin on the forearms.  No other active areas of bleeding or bruising.  Patient has medical history of treated hepatitis C.    Past Medical History:  Diagnosis Date  . Arthritis   . Ataxia   . Cirrhosis ([place])   . Depression with anxiety   . Fibromyalgia   . Gallbladder sludge   . Hiatal hernia   . Migraine   . Substance abuse ([place])    in 1970's    Patient Active Problem List   Diagnosis Date Noted  . Cerebellar ataxia in diseases classified elsewhere ([place]) 09/13/2020  . Chronic low back pain 09/13/2020  . Slurred  speech 03/08/2020  . Other cirrhosis of liver ([place]) 12/05/2019  . Dizzy 11/09/2019  . Fall 11/09/2019  . Hypomagnesemia 11/09/2019  . Scalp laceration 11/09/2019  . Alcohol intoxication ([place]) 11/09/2019  . Hypokalemia   . Gait abnormality 11/03/2019  . Chronic hepatitis C without hepatic coma ([place]) 11/01/2019    Past Surgical History:  Procedure Laterality Date  . ANKLE SURGERY Right 2011  . BREAST ENHANCEMENT SURGERY  1996  . TUBAL LIGATION  1979  . WRIST SURGERY Right 2016     OB History   No obstetric history on file.     Family History  Problem Relation Age of Onset  . Bone cancer Mother   . Esophageal cancer Father     Social History   Tobacco Use  . Smoking status: Former Smoker    Quit date: 1998    Years since quitting: 24.3  . Smokeless tobacco: Never Used  Vaping Use  . Vaping Use: Never used  Substance Use Topics  . Alcohol use: Yes    Comment: occasional  . Drug use: Never    Home Medications Prior to Admission medications   Medication Sig Start Date End Date Taking? Authorizing Provider  DULoxetine (CYMBALTA) 60 MG capsule Take 1 capsule (60 mg total) by mouth daily. 09/13/20   Marcial Pacas, MD  gabapentin (  NEURONTIN) 100 MG capsule Take 1 capsule (100 mg total) by mouth 3 (three) times daily. 09/13/20   Marcial Pacas, MD  HYDROcodone-acetaminophen (NORCO) 10-325 MG tablet Take 1 tablet by mouth every 4 (four) hours as needed.    [provider]  sertraline (ZOLOFT) 50 MG tablet Take 50 mg by mouth every morning.    [provider]  Vitamin D, Cholecalciferol, 25 MCG (1000 UT) TABS Take by mouth.    [provider]    Allergies    Adhesive [tape] and Latex  Review of Systems   Review of Systems 10 systems reviewed and negative except as per HPI Physical Exam Updated Vital Signs BP (!) 165/94 (BP Location: Left Arm)   Pulse 65   Temp 98.3 F (36.8 C)   Resp 16   Ht 5\' 6"  (1.676 m)   Wt 61.2 kg   SpO2 100%   BMI  21.79 kg/m   Physical Exam Constitutional:      Comments: Patient is alert nontoxic.  Mental status clear.  No respiratory distress  HENT:     Head:     Comments: Patient has barely perceptible area over the right eyebrow from previous hematoma.  This is really almost completely healed.    Nose: Nose normal.     Mouth/Throat:     Mouth: Mucous membranes are moist.     Pharynx: Oropharynx is clear.  Eyes:     Extraocular Movements: Extraocular movements intact.     Conjunctiva/sclera: Conjunctivae normal.     Pupils: Pupils are equal, round, and reactive to light.  Cardiovascular:     Rate and Rhythm: Normal rate and regular rhythm.     Pulses: Normal pulses.     Heart sounds: Normal heart sounds.  Pulmonary:     Effort: Pulmonary effort is normal.     Breath sounds: Normal breath sounds.     Comments: Inspection of the chest wall shows an irregular, resolving bruise on the anterior chest wall on the right.  This is about 4 cm above the nipple in the body of the breast.  It appears very consistent with a traumatic bruise.  It is in the resolution phase with brownish-yellow discoloration.  It is flat and smooth.  Slightly tender no erythema or swelling.  No indication of any infected or residual significant hematoma.  The remainder of the breast is nontender.  No mass or fullness.  No erythema.  Both breasts are symmetric in appearance.  No other remarkable chest wall abnormality. Abdominal:     General: There is no distension.     Palpations: Abdomen is soft.     Tenderness: There is no abdominal tenderness. There is no guarding.  Musculoskeletal:        General: No swelling or tenderness.     Cervical back: Neck supple.     Right lower leg: No edema.     Left lower leg: No edema.  Skin:    General: Skin is warm and dry.  Neurological:     General: No focal deficit present.     Mental Status: She is oriented to person, place, and time.     Comments: Patient is alert and  oriented.  She is interactive and situationally appropriate.  Sometimes her slight delays in her speech but cognitively is alert and a good historian.  No focal deficits.  Psychiatric:        Mood and Affect: Mood normal.     ED Results /  Procedures / Treatments   Labs (all labs ordered are listed, but only abnormal results are displayed) Labs Reviewed  PROTIME-INR - Abnormal; Notable for the following components:      Result Value   Prothrombin Time 15.5 (*)    All other components within normal limits  CBC WITH DIFFERENTIAL/PLATELET - Abnormal; Notable for the following components:   WBC 3.7 (*)    Platelets 105 (*)    All other components within normal limits  COMPREHENSIVE METABOLIC PANEL  URINALYSIS, ROUTINE W REFLEX MICROSCOPIC    EKG None  Radiology CT Head Wo Contrast  Result Date: 01/21/2021 CLINICAL DATA:  Headache. Fall 1 month ago. Intracranial hemorrhage suspected. EXAM: CT HEAD WITHOUT CONTRAST TECHNIQUE: Contiguous axial images were obtained from the base of the skull through the vertex without intravenous contrast. COMPARISON:  MR head 12/12/2019, CT head 11/09/2019 FINDINGS: Brain: Cerebral ventricle sizes are concordant with the degree of cerebral volume loss. Patchy and confluent areas of decreased attenuation are noted throughout the deep and periventricular white matter of the cerebral hemispheres bilaterally, compatible with chronic microvascular ischemic disease. No evidence of large-territorial acute infarction. No parenchymal hemorrhage. No mass lesion. No extra-axial collection. No mass effect or midline shift. No hydrocephalus. Basilar cisterns are patent. Vascular: No hyperdense vessel. Skull: No acute fracture or focal lesion. Sinuses/Orbits: Paranasal sinuses and mastoid air cells are clear. The orbits are unremarkable. Other: None. IMPRESSION: No acute intracranial abnormality. Electronically Signed   By: Iven Finn M.D.   On: 01/21/2021 16:46     Procedures Procedures   Medications Ordered in ED Medications - No data to display  ED Course  I have reviewed the triage vital signs and the nursing notes.  Pertinent labs & imaging results that were available during my care of the patient were reviewed by me and considered in my medical decision making (see chart for details).    MDM Rules/Calculators/A&P                          Patient presents with some residual headache after a fall a month ago.  The initial hematoma has resolved.  CT does not show any evidence of traumatic injury.  Patient has an underlying cerebellar disorder.  There does not appear to be any acute changes.  Patient reports she also had concern for a bruise on the anterior chest wall.  This appears very consistent with a traumatic bruise, she does not know how it happened.  It is resolving as would be anticipated.  Patient has had chronic mild thrombocytopenia evaluated by hematology.  Platelets are stable at baseline at 109.  Patient has no anemia hemoglobin is at 14.  Patient has history of treated hep C cirrhosis.  LFTs are normal and INR is at 1.2.  At this time patient is clinically well in appearance and stable to continue working with her neurologist and hematologist as needed. Final Clinical Impression(s) / ED Diagnoses Final diagnoses:  Acute nonintractable headache, unspecified headache type  Bruising    Rx / DC Orders ED Discharge Orders    None       Charlesetta Shanks, MD 01/21/21 1856

## 2021-01-22 ENCOUNTER — Other Ambulatory Visit: Payer: Self-pay | Admitting: Neurology

## 2021-01-22 DIAGNOSIS — G8929 Other chronic pain: Secondary | ICD-10-CM

## 2021-01-22 DIAGNOSIS — R269 Unspecified abnormalities of gait and mobility: Secondary | ICD-10-CM

## 2021-01-22 DIAGNOSIS — R4781 Slurred speech: Secondary | ICD-10-CM

## 2021-01-22 DIAGNOSIS — G3281 Cerebellar ataxia in diseases classified elsewhere: Secondary | ICD-10-CM

## 2021-01-24 ENCOUNTER — Ambulatory Visit
Admission: RE | Admit: 2021-01-24 | Discharge: 2021-01-24 | Disposition: A | Discharge status: Home / Self Care | Payer: Medicare Other | Source: Ambulatory Visit | Attending: Neurology | Admitting: Neurology

## 2021-01-24 DIAGNOSIS — G3281 Cerebellar ataxia in diseases classified elsewhere: Secondary | ICD-10-CM

## 2021-01-24 DIAGNOSIS — G8929 Other chronic pain: Secondary | ICD-10-CM

## 2021-01-24 DIAGNOSIS — M545 Low back pain, unspecified: Secondary | ICD-10-CM

## 2021-01-24 DIAGNOSIS — R4781 Slurred speech: Secondary | ICD-10-CM

## 2021-01-24 DIAGNOSIS — R269 Unspecified abnormalities of gait and mobility: Secondary | ICD-10-CM | POA: Diagnosis not present

## 2021-01-28 ENCOUNTER — Other Ambulatory Visit: Payer: Self-pay | Admitting: Family Medicine

## 2021-01-28 ENCOUNTER — Telehealth: Payer: Self-pay | Admitting: Neurology

## 2021-01-28 ENCOUNTER — Encounter: Payer: Self-pay | Admitting: Neurology

## 2021-01-28 DIAGNOSIS — G8929 Other chronic pain: Secondary | ICD-10-CM

## 2021-01-28 DIAGNOSIS — Z1231 Encounter for screening mammogram for malignant neoplasm of breast: Secondary | ICD-10-CM

## 2021-01-28 NOTE — Telephone Encounter (Signed)
IMPRESSION:   MRI lumbar spine (without) demonstrating: - At L4-5: pseudo disc bulging and facet hypertrophy with moderate spinal stenosis and moderate biforaminal stenosis. - At L5-S1: disc bulging and facet hypertrophy with moderate-severe biforaminal stenosis. - Incidental left renal cyst (4.4cm).  Please call patient, MRI of the lumbar spine showed multilevel degenerative changes, most obvious at L4-5 level, moderate spinal stenosis, moderate bilateral foraminal stenosis, left 5 S1, moderate severe foraminal narrowing  Incidental left renal cyst up to 4.4 cm  I ordered EMG nerve conduction study,

## 2021-01-28 NOTE — Telephone Encounter (Signed)
I spoke to the patient. She verbalized understanding of the MRI lumbar findings. She scheduled her NCV/EMG on 02/13/21. She would also like for further review her results with Dr. Krista Blue at that visit.

## 2021-02-13 ENCOUNTER — Encounter: Payer: Self-pay | Admitting: Neurology

## 2021-02-25 ENCOUNTER — Emergency Department (HOSPITAL_COMMUNITY): Payer: Medicare Other

## 2021-02-25 ENCOUNTER — Emergency Department (HOSPITAL_COMMUNITY)
Admission: EM | Admit: 2021-02-25 | Discharge: 2021-02-25 | Disposition: A | Discharge status: Home / Self Care | Payer: Medicare Other | Attending: Emergency Medicine | Admitting: Emergency Medicine

## 2021-02-25 ENCOUNTER — Other Ambulatory Visit: Payer: Self-pay

## 2021-02-25 ENCOUNTER — Encounter (HOSPITAL_COMMUNITY): Payer: Self-pay

## 2021-02-25 DIAGNOSIS — S4992XA Unspecified injury of left shoulder and upper arm, initial encounter: Secondary | ICD-10-CM | POA: Diagnosis present

## 2021-02-25 DIAGNOSIS — S42292A Other displaced fracture of upper end of left humerus, initial encounter for closed fracture: Secondary | ICD-10-CM | POA: Diagnosis not present

## 2021-02-25 DIAGNOSIS — Z87891 Personal history of nicotine dependence: Secondary | ICD-10-CM | POA: Diagnosis not present

## 2021-02-25 DIAGNOSIS — S42212A Unspecified displaced fracture of surgical neck of left humerus, initial encounter for closed fracture: Secondary | ICD-10-CM | POA: Diagnosis not present

## 2021-02-25 DIAGNOSIS — Z91040 Latex allergy status: Secondary | ICD-10-CM | POA: Insufficient documentation

## 2021-02-25 DIAGNOSIS — S42202A Unspecified fracture of upper end of left humerus, initial encounter for closed fracture: Secondary | ICD-10-CM | POA: Diagnosis not present

## 2021-02-25 DIAGNOSIS — Y92009 Unspecified place in unspecified non-institutional (private) residence as the place of occurrence of the external cause: Secondary | ICD-10-CM | POA: Diagnosis not present

## 2021-02-25 DIAGNOSIS — W010XXA Fall on same level from slipping, tripping and stumbling without subsequent striking against object, initial encounter: Secondary | ICD-10-CM | POA: Insufficient documentation

## 2021-02-25 DIAGNOSIS — Y9301 Activity, walking, marching and hiking: Secondary | ICD-10-CM | POA: Diagnosis not present

## 2021-02-25 DIAGNOSIS — M25512 Pain in left shoulder: Secondary | ICD-10-CM | POA: Diagnosis not present

## 2021-02-25 DIAGNOSIS — S42332A Displaced oblique fracture of shaft of humerus, left arm, initial encounter for closed fracture: Secondary | ICD-10-CM | POA: Diagnosis not present

## 2021-02-25 MED ORDER — ONDANSETRON 4 MG PO TBDP: 4.0000 mg | ORAL_TABLET | Freq: Three times a day (TID) | ORAL | 0 refills | Status: DC | PRN

## 2021-02-25 MED ORDER — HYDROCODONE-ACETAMINOPHEN 5-325 MG PO TABS
1.0000 | ORAL_TABLET | Freq: Once | ORAL | Status: AC
  Administered 2021-02-25: 1 via ORAL
  Filled 2021-02-25: qty 1

## 2021-02-25 MED ORDER — HYDROCODONE-ACETAMINOPHEN 5-325 MG PO TABS: 1.0000 | ORAL_TABLET | Freq: Four times a day (QID) | ORAL | 0 refills | Status: DC | PRN

## 2021-02-25 NOTE — ED Triage Notes (Signed)
Pt presents from home with daughter, states she had a mechanical fall last night and tripped over a rug and wooden boxes. C/o L wrist pain. Denies hitting head

## 2021-02-25 NOTE — Progress Notes (Signed)
Orthopedic Tech Progress Note Patient Details:  Kelly Singh January 07, 1956 [id number]  Ortho Devices Type of Ortho Device: Sling immobilizer Ortho Device/Splint Location: left Ortho Device/Splint Interventions: Application   Post Interventions Patient Tolerated: Well Instructions Provided: Care of device   Kelly Singh 02/25/2021, 8:35 PM

## 2021-02-25 NOTE — ED Notes (Signed)
An After Visit Summary was printed and given to the patient. Discharge instructions given and no further questions at this time.  Pt leaving with daughter.  

## 2021-02-25 NOTE — ED Provider Notes (Signed)
[facility] DEPT Provider Note   CSN: [place]:[id number] Arrival date & time: 02/25/21  1922     History Chief Complaint  Patient presents with  . Fall    Kelly Singh is a 65 y.o. female who presents for evaluation of left shoulder pain that occurred after mechanical fall that occurred last night.  Patient reports she was walking in her house and states that she tripped over a rug and someone in boxes.  She reports that she has been having some left shoulder pain since then.  She states she did not hit her head and has no LOC.  She is not on any blood thinners.  He has been able to ambulate since this happened.  She reports pain to her left shoulder.  She called EMS last night and they came and evaluated her.  At that time, she did not want to go to the emergency department.  Throughout the day, her pain, swelling has worsened, which is what prompted her to come to the emergency department today.  She does report that she was having some numbness to her hand and wrist but states that has since improved.  She states it hurts more when she tries to move her arm.  She denies any nausea/vomiting, neck pain, chest pain, difficulty breathing.  The history is provided by the patient and a relative.       Past Medical History:  Diagnosis Date  . Arthritis   . Ataxia   . Cirrhosis ([place])   . Depression with anxiety   . Fibromyalgia   . Gallbladder sludge   . Hiatal hernia   . Migraine   . Substance abuse ([place])    in 1970's    Patient Active Problem List   Diagnosis Date Noted  . 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 Chronic low back pain 09/13/2020  . Slurred speech 03/08/2020  . Other cirrhosis of liver ([place]) 12/05/2019  . Dizzy 11/09/2019  . Fall 11/09/2019  . Hypomagnesemia 11/09/2019  . Scalp laceration 11/09/2019  . Alcohol intoxication ([place]) 11/09/2019  . Hypokalemia   . Gait abnormality 11/03/2019  . Chronic  hepatitis C without hepatic coma ([place]) 11/01/2019    Past Surgical History:  Procedure Laterality Date  . ANKLE SURGERY Right 2011  . BREAST ENHANCEMENT SURGERY  1996  . TUBAL LIGATION  1979  . WRIST SURGERY Right 2016     OB History   No obstetric history on file.     Family History  Problem Relation Age of Onset  . Bone cancer Mother   . Esophageal cancer Father     Social History   Tobacco Use  . Smoking status: Former Smoker    Quit date: 1998    Years since quitting: 24.4  . Smokeless tobacco: Never Used  Vaping Use  . Vaping Use: Never used  Substance Use Topics  . Alcohol use: Yes    Comment: occasional  . Drug use: Never    Home Medications Prior to Admission medications   Medication Sig Start Date End Date Taking? Authorizing Provider  HYDROcodone-acetaminophen (NORCO/VICODIN) 5-325 MG tablet Take 1 tablet by mouth every 6 (six) hours as needed. 02/25/21  Yes Providence Lanius A, PA-C  ondansetron (ZOFRAN ODT) 4 MG disintegrating tablet Take 1 tablet (4 mg total) by mouth every 8 (eight) hours as needed for nausea or vomiting. 02/25/21  Yes Volanda Napoleon, PA-C  DULoxetine (CYMBALTA) 60 MG capsule Take 1 capsule (  60 mg total) by mouth daily. 09/13/20   Marcial Pacas, MD  gabapentin (NEURONTIN) 100 MG capsule Take 1 capsule (100 mg total) by mouth 3 (three) times daily. 09/13/20   Marcial Pacas, MD  sertraline (ZOLOFT) 50 MG tablet Take 50 mg by mouth every morning.    [provider]  Vitamin D, Cholecalciferol, 25 MCG (1000 UT) TABS Take by mouth.    [provider]    Allergies    Adhesive [tape] and Latex  Review of Systems   Review of Systems  Respiratory: Negative for shortness of breath.   Cardiovascular: Negative for chest pain.  Gastrointestinal: Negative for abdominal pain, nausea and vomiting.  Musculoskeletal: Negative for back pain and neck pain.       Left shoulder pain  Neurological: Positive for numbness (resolved). Negative  for weakness and headaches.  All other systems reviewed and are negative.   Physical Exam Updated Vital Signs BP (!) 150/86   Pulse 72   Temp 97.9 F (36.6 C) (Oral)   Resp 18   Ht 5\' 6"  (1.676 m)   Wt 61.2 kg   SpO2 99%   BMI 21.78 kg/m   Physical Exam Vitals and nursing note reviewed.  Constitutional:      Appearance: Normal appearance. She is well-developed.  HENT:     Head: Normocephalic and atraumatic.     Comments: No tenderness to palpation of skull. No deformities or crepitus noted. No open wounds, abrasions or lacerations.  Eyes:     General: Lids are normal.     Conjunctiva/sclera: Conjunctivae normal.     Pupils: Pupils are equal, round, and reactive to light.     Comments: PERRL. EOMs intact. No nystagmus. No neglect.   Neck:     Comments: Full flexion/extension and lateral movement of neck fully intact. No bony midline tenderness. No deformities or crepitus.  Cardiovascular:     Rate and Rhythm: Normal rate and regular rhythm.     Pulses: Normal pulses.          Radial pulses are 2+ on the right side and 2+ on the left side.     Heart sounds: Normal heart sounds. No murmur heard. No friction rub. No gallop.   Pulmonary:     Effort: Pulmonary effort is normal.     Breath sounds: Normal breath sounds.     Comments: Lungs clear to auscultation bilaterally.  Symmetric chest rise.  No wheezing, rales, rhonchi. Chest:     Comments: No anterior chest wall tenderness.  No deformity or crepitus noted.  No evidence of flail chest. Abdominal:     Palpations: Abdomen is soft. Abdomen is not rigid.     Tenderness: There is no abdominal tenderness. There is no guarding.     Comments: Abdomen is soft, non-distended, non-tender. No rigidity, No guarding. No peritoneal signs.  Musculoskeletal:        General: Normal range of motion.     Cervical back: Full passive range of motion without pain.     Comments: TTP to the proximal left shoulder/humerus with overlying  ecchymosis and soft tissue swelling. Compartments of LUE are soft.  Limited ROM of LUE secondary to pain. No bony tenderness noted to the left elbow, forearm, wrist. No tenderness to the RUE. No pelvic instability.   Skin:    General: Skin is warm and dry.     Capillary Refill: Capillary refill takes less than 2 seconds.     Comments: Good distal cap  refill. LUE is not dusky in appearance or cool to touch.  Neurological:     Mental Status: She is alert and oriented to person, place, and time.     Comments: Cranial nerves III-XII intact Follows commands, Moves all extremities  5/5 strength to BUE and BLE  Sensation intact throughout all major nerve distributions No gait abnormalities  No slurred speech. No facial droop.   Psychiatric:        Speech: Speech normal.     ED Results / Procedures / Treatments   Labs (all labs ordered are listed, but only abnormal results are displayed) Labs Reviewed - No data to display  EKG None  Radiology DG Shoulder Left  Result Date: 02/25/2021 CLINICAL DATA:  Fall last night, tripped over rug. Left shoulder pain. EXAM: LEFT SHOULDER - 2+ VIEW COMPARISON:  None. FINDINGS: Oblique mildly displaced proximal humerus fracture involving the surgical neck. No extension to the glenohumeral joint. Glenohumeral joint is congruent. Acromioclavicular joint is congruent. Mild acromioclavicular degenerative change. No acute finding in the included left hemithorax. IMPRESSION: Oblique mildly displaced proximal humerus fracture involving the surgical neck. Electronically Signed   By: Keith Rake M.D.   On: 02/25/2021 20:19   DG Humerus Left  Result Date: 02/25/2021 CLINICAL DATA:  Fall last night, tripped over rug. Left shoulder pain. EXAM: LEFT HUMERUS - 2+ VIEW COMPARISON:  None. FINDINGS: Mildly displaced fracture of the left proximal humerus with dominant fracture plane involving the surgical neck. The distal humerus is intact. Elbow alignment is grossly  maintained. IMPRESSION: Mildly displaced fracture of the left proximal humerus. Electronically Signed   By: Keith Rake M.D.   On: 02/25/2021 20:19    Procedures Procedures   Medications Ordered in ED Medications  HYDROcodone-acetaminophen (NORCO/VICODIN) 5-325 MG per tablet 1 tablet (1 tablet Oral Given 02/25/21 2035)    ED Course  I have reviewed the triage vital signs and the nursing notes.  Pertinent labs & imaging results that were available during my care of the patient were reviewed by me and considered in my medical decision making (see chart for details).    MDM Rules/Calculators/A&P                          65 year old female who presents for evaluation of left shoulder pain after mechanical fall that occurred last night.  Reports she tripped over some boxes and and landed on her left shoulder.  Did not hit her head.  No LOC.  Is on blood thinners.  Reports pain and swelling to left shoulder since then.  On initial arrival, she is afebrile, toxic appearing.  Vital signs are stable.  She initially reported she had some numbness around her left wrist but states that has since improved.  Now has full sensation.  On my evaluation, she has good radial pulses, cap refill.  She has diffuse soft tissue swelling, ecchymosis noted proximal left upper extremity.  Compartments are soft.  Concern for fracture versus dislocation.  Patient no midline C-spine tenderness.  Patient with no head injury, LOC.  She is not on blood thinners.  No neurodeficits noted on exam.  Will obtain imaging.  X-ray reviewed there is a mildly displaced fracture of the left proximal humerus noted.  I reviewed triage note.  They did mention left wrist pain.  On my evaluation, I had no tenderness to palpation noted to her left wrist.  I asked patient if she stated it hurts and  she said it did not.  I discussed results with patient.  Instructed her that she will need to follow-up with orthopedic.  We will put her in  his arm sling for support and stabilization.  We will give patient a short course of pain medication for acute/breakthrough pain.  Patient instructed on following up with Ortho.  Patient instructed on signs/symptoms of compartment syndrome explained to her that she would need to return the emergency department immediately. At this time, patient exhibits no emergent life-threatening condition that require further evaluation in ED. Patient had ample opportunity for questions and discussion. All patient's questions were answered with full understanding. Strict return precautions discussed. Patient expresses understanding and agreement to plan.   Portions of this note were generated with [employer]. Dictation errors may occur despite best attempts at proofreading.  Final Clinical Impression(s) / ED Diagnoses Final diagnoses:  Other closed displaced fracture of proximal end of left humerus, initial encounter    Rx / DC Orders ED Discharge Orders         Ordered    HYDROcodone-acetaminophen (NORCO/VICODIN) 5-325 MG tablet  Every 6 hours PRN        02/25/21 2128    ondansetron (ZOFRAN ODT) 4 MG disintegrating tablet  Every 8 hours PRN        02/25/21 2128           Desma Mcgregor 02/25/21 2225    Arnaldo Natal, MD 02/26/21 0021

## 2021-02-25 NOTE — Discharge Instructions (Addendum)
You can take Tylenol or Ibuprofen as directed for pain. You can alternate Tylenol and Ibuprofen every 4 hours. If you take Tylenol at 1pm, then you can take Ibuprofen at 5pm. Then you can take Tylenol again at 9pm.   Take pain medications as directed for break through pain. Do not drive or operate machinery while taking this medication.   Take zofran for nausea as needed.   Follow the RICE (Rest, Ice, Compression, Elevation) protocol as directed.   Keep the sling on for support and stabilization.  It is very important a follow-up with referred orthopedic doctor.  Return to the emergency department for any worsening pain, discoloration of your hand, numbness, inability to move, any other worsening concerning symptoms.

## 2021-02-27 ENCOUNTER — Encounter: Payer: Medicare Other | Admitting: Neurology

## 2021-02-28 DIAGNOSIS — S42202A Unspecified fracture of upper end of left humerus, initial encounter for closed fracture: Secondary | ICD-10-CM | POA: Diagnosis not present

## 2021-02-28 DIAGNOSIS — S4992XA Unspecified injury of left shoulder and upper arm, initial encounter: Secondary | ICD-10-CM | POA: Diagnosis not present

## 2021-03-04 ENCOUNTER — Other Ambulatory Visit: Payer: Self-pay

## 2021-03-04 ENCOUNTER — Emergency Department (HOSPITAL_COMMUNITY)
Admission: EM | Admit: 2021-03-04 | Discharge: 2021-03-04 | Disposition: A | Discharge status: Home / Self Care | Payer: Medicare Other | Attending: Emergency Medicine | Admitting: Emergency Medicine

## 2021-03-04 ENCOUNTER — Emergency Department (HOSPITAL_COMMUNITY): Payer: Medicare Other

## 2021-03-04 ENCOUNTER — Encounter (HOSPITAL_COMMUNITY): Payer: Self-pay | Admitting: Emergency Medicine

## 2021-03-04 DIAGNOSIS — M25512 Pain in left shoulder: Secondary | ICD-10-CM

## 2021-03-04 DIAGNOSIS — Z87891 Personal history of nicotine dependence: Secondary | ICD-10-CM | POA: Diagnosis not present

## 2021-03-04 DIAGNOSIS — S42212A Unspecified displaced fracture of surgical neck of left humerus, initial encounter for closed fracture: Secondary | ICD-10-CM | POA: Insufficient documentation

## 2021-03-04 DIAGNOSIS — Z91040 Latex allergy status: Secondary | ICD-10-CM | POA: Diagnosis not present

## 2021-03-04 DIAGNOSIS — W1839XA Other fall on same level, initial encounter: Secondary | ICD-10-CM | POA: Insufficient documentation

## 2021-03-04 DIAGNOSIS — S4992XA Unspecified injury of left shoulder and upper arm, initial encounter: Secondary | ICD-10-CM | POA: Diagnosis present

## 2021-03-04 NOTE — Discharge Instructions (Addendum)
Please follow back up with Dr. Rowe Robert this week for further evaluation of your worsening pain

## 2021-03-04 NOTE — ED Notes (Signed)
Pt ambulatory to restroom

## 2021-03-04 NOTE — ED Triage Notes (Signed)
Pt injured her left shoulder last week and was seen in [place]. Pt is in a sling on her left arm. Pt states that her left shoulder is 9/10 in her left arm and that she has not been able to sleep since her injury. Pt states she has not taken anything for pain today.

## 2021-03-04 NOTE — ED Provider Notes (Signed)
[facility] DEPT Provider Note   CSN: [id number] Arrival date & time: 03/04/21  1621     History Chief Complaint  Patient presents with  . Shoulder Pain    Kelly Singh is a 65 y.o. female with PMHx fibromyalgia who presents to the ED today with complaint of ongoing left shoulder pain s/p injury that occurred last week. Pt was seen in the ED on 05/23 after sustaining a fall and found to have a proximal humerus fracture on xray. She was discharged home with a short course of pain mediation and advised to follow up with orthopedics. Per chart review pt saw orthopedist Dr. Annye Asa on 05/26 for same. Daughter reports they did not provide her any additional pain medication at the orthopedist office and she has been unable to sleep due to the amount of pain she is in. Daughter is concerned that her fracture could have "shifted" prompting her to bring pt back to the ED today. Daughter states she has been giving her mom Aleve for pain.   The history is provided by the patient and medical records.       Past Medical History:  Diagnosis Date  . Arthritis   . Ataxia   . Cirrhosis ([place])   . Depression with anxiety   . Fibromyalgia   . Gallbladder sludge   . Hiatal hernia   . Migraine   . Substance abuse ([place])    in 1970's    Patient Active Problem List   Diagnosis Date Noted  . Cerebellar ataxia in diseases classified elsewhere ([place]) 09/13/2020  . Chronic low back pain 09/13/2020  . Slurred speech 03/08/2020  . Other cirrhosis of liver ([place]) 12/05/2019  . Dizzy 11/09/2019  . Fall 11/09/2019  . Hypomagnesemia 11/09/2019  . Scalp laceration 11/09/2019  . Alcohol intoxication ([place]) 11/09/2019  . Hypokalemia   . Gait abnormality 11/03/2019  . Chronic hepatitis C without hepatic coma ([place]) 11/01/2019    Past Surgical History:  Procedure Laterality Date  . ANKLE SURGERY Right 2011  . BREAST ENHANCEMENT SURGERY  1996  . TUBAL LIGATION  1979   . WRIST SURGERY Right 2016     OB History   No obstetric history on file.     Family History  Problem Relation Age of Onset  . Bone cancer Mother   . Esophageal cancer Father     Social History   Tobacco Use  . Smoking status: Former Smoker    Quit date: 1998    Years since quitting: 24.4  . Smokeless tobacco: Never Used  Vaping Use  . Vaping Use: Never used  Substance Use Topics  . Alcohol use: Yes    Comment: occasional  . Drug use: Never    Home Medications Prior to Admission medications   Medication Sig Start Date End Date Taking? Authorizing Provider  DULoxetine (CYMBALTA) 60 MG capsule Take 1 capsule (60 mg total) by mouth daily. 09/13/20   Marcial Pacas, MD  gabapentin (NEURONTIN) 100 MG capsule Take 1 capsule (100 mg total) by mouth 3 (three) times daily. 09/13/20   Marcial Pacas, MD  HYDROcodone-acetaminophen (NORCO/VICODIN) 5-325 MG tablet Take 1 tablet by mouth every 6 (six) hours as needed. 02/25/21   Volanda Napoleon, PA-C  ondansetron (ZOFRAN ODT) 4 MG disintegrating tablet Take 1 tablet (4 mg total) by mouth every 8 (eight) hours as needed for nausea or vomiting. 02/25/21   Volanda Napoleon, PA-C  sertraline (ZOLOFT) 50 MG tablet Take 50 mg  by mouth every morning.    [provider]  Vitamin D, Cholecalciferol, 25 MCG (1000 UT) TABS Take by mouth.    [provider]    Allergies    Adhesive [tape] and Latex  Review of Systems   Review of Systems  Constitutional: Negative for chills and fever.  Musculoskeletal: Positive for arthralgias.  Psychiatric/Behavioral: Positive for sleep disturbance.  All other systems reviewed and are negative.   Physical Exam Updated Vital Signs BP (!) 167/96   Pulse 100   Temp 98.2 F (36.8 C) (Oral)   Resp 20   Ht 5\' 6"  (1.676 m)   Wt 59 kg   SpO2 100%   BMI 20.98 kg/m   Physical Exam Vitals and nursing note reviewed.  Constitutional:      Appearance: She is not ill-appearing.     Comments:  Appears intoxicated  HENT:     Head: Normocephalic and atraumatic.  Eyes:     Conjunctiva/sclera: Conjunctivae normal.  Cardiovascular:     Rate and Rhythm: Normal rate and regular rhythm.  Pulmonary:     Effort: Pulmonary effort is normal.     Breath sounds: Normal breath sounds.  Musculoskeletal:     Comments: Shoulder immobilizer in place. + TTP and ecchymosis noted to proximal humerus. Mild TTP to shoulder joint. ROM limited s/2 pain. No tenderness distally however pt does have some ecchymosis distally likely from gravity. 2+ radial pulse.   Skin:    General: Skin is warm and dry.     Coloration: Skin is not jaundiced.  Neurological:     Mental Status: She is alert.     ED Results / Procedures / Treatments   Labs (all labs ordered are listed, but only abnormal results are displayed) Labs Reviewed - No data to display  EKG None  Radiology DG Shoulder Left  Result Date: 03/04/2021 CLINICAL DATA:  History of proximal humeral fracture, worsening pain, fall EXAM: LEFT SHOULDER - 2+ VIEW COMPARISON:  02/26/2019 FINDINGS: Redemonstrated distracted, displaced fracture of the surgical neck of the left humerus. The glenohumeral joint remains in apposition. The acromioclavicular joint is intact. IMPRESSION: Redemonstrated distracted, displaced fracture of the surgical neck of the left humerus. The glenohumeral joint remains in apposition. Electronically Signed   By: Eddie Candle M.D.   On: 03/04/2021 17:59    Procedures Procedures   Medications Ordered in ED Medications - No data to display  ED Course  I have reviewed the triage vital signs and the nursing notes.  Pertinent labs & imaging results that were available during my care of the patient were reviewed by me and considered in my medical decision making (see chart for details).    MDM Rules/Calculators/A&P                          65 year old female who presents to the ED today with continued pain to her left shoulder.   Diagnosed with a proximal humerus fracture last week, has seen orthopedist and advised to continue wearing sling.  Daughter reports she has been giving Aleve without relief prompting her to bring her back to the ED today as she is concerned that her fracture has "shifted."  On arrival to the ED vitals are stable.  Patient does appear intoxicated however.  Daughter states that she is "sleepy" due to lack of sleep over the past few days.  Per PDMP review it appears that patient receives 100 tablets of 10-325 mg  hydrocodone monthly.  Last filled on 05/11.  The provider who prescribes this is located in [place].  He also received additional 5-325 mg hydrocodone at her last ED visit on 05/23.  When patient is asked regarding this medication she states that she is out of the hydrocodone.  She should not be out of her monthly prescription until 06/11.  Concern the patient is taking too much medication at this time given appearance of intoxication/under the influence.  We will plan for repeat x-ray at this time however have had lengthy discussion with daughter and patient at bedside that they will not be getting any additional pain medication in the ED today.  They will need to follow-up with orthopedist for same.   Xray: IMPRESSION:  Redemonstrated distracted, displaced fracture of the surgical neck  of the left humerus. The glenohumeral joint remains in apposition.     Fracture is slightly more displaced than prior however do not feel this requires emergent surgical intervention. Have discussed that pt should follow up with her orthopedist for further eval. Do not feel additional pain medication is appropriate at this time. Have discussed case with attending physician Dr. Dina Rich who agrees with plan  This note was prepared using Dragon voice recognition software and may include unintentional dictation errors due to the inherent limitations of voice recognition software.  Final Clinical Impression(s) /  ED Diagnoses Final diagnoses:  Acute pain of left shoulder    Rx / DC Orders ED Discharge Orders    None       Discharge Instructions     Please follow back up with Dr. Rowe Robert this week for further evaluation of your worsening pain       Eustaquio Maize, PA-C 03/04/21 1828    Lorelle Gibbs, DO 03/04/21 1956

## 2021-03-06 ENCOUNTER — Observation Stay (HOSPITAL_COMMUNITY)
Admission: EM | Admit: 2021-03-06 | Discharge: 2021-03-11 | Disposition: A | Discharge status: Home / Self Care | Payer: Medicare Other | Attending: Internal Medicine | Admitting: Internal Medicine

## 2021-03-06 ENCOUNTER — Other Ambulatory Visit: Payer: Self-pay

## 2021-03-06 ENCOUNTER — Emergency Department (HOSPITAL_COMMUNITY): Payer: Medicare Other

## 2021-03-06 ENCOUNTER — Encounter (HOSPITAL_COMMUNITY): Payer: Self-pay | Admitting: Emergency Medicine

## 2021-03-06 ENCOUNTER — Observation Stay (HOSPITAL_COMMUNITY): Payer: Medicare Other

## 2021-03-06 DIAGNOSIS — F1910 Other psychoactive substance abuse, uncomplicated: Secondary | ICD-10-CM | POA: Insufficient documentation

## 2021-03-06 DIAGNOSIS — S42213A Unspecified displaced fracture of surgical neck of unspecified humerus, initial encounter for closed fracture: Secondary | ICD-10-CM | POA: Diagnosis present

## 2021-03-06 DIAGNOSIS — R0689 Other abnormalities of breathing: Secondary | ICD-10-CM | POA: Diagnosis not present

## 2021-03-06 DIAGNOSIS — Z20822 Contact with and (suspected) exposure to covid-19: Secondary | ICD-10-CM | POA: Diagnosis not present

## 2021-03-06 DIAGNOSIS — R531 Weakness: Secondary | ICD-10-CM | POA: Diagnosis not present

## 2021-03-06 DIAGNOSIS — G9341 Metabolic encephalopathy: Secondary | ICD-10-CM | POA: Diagnosis not present

## 2021-03-06 DIAGNOSIS — R918 Other nonspecific abnormal finding of lung field: Secondary | ICD-10-CM | POA: Insufficient documentation

## 2021-03-06 DIAGNOSIS — W19XXXA Unspecified fall, initial encounter: Secondary | ICD-10-CM | POA: Diagnosis not present

## 2021-03-06 DIAGNOSIS — Z87891 Personal history of nicotine dependence: Secondary | ICD-10-CM | POA: Insufficient documentation

## 2021-03-06 DIAGNOSIS — B182 Chronic viral hepatitis C: Secondary | ICD-10-CM | POA: Diagnosis present

## 2021-03-06 DIAGNOSIS — Z91040 Latex allergy status: Secondary | ICD-10-CM | POA: Diagnosis not present

## 2021-03-06 DIAGNOSIS — Y900 Blood alcohol level of less than 20 mg/100 ml: Secondary | ICD-10-CM | POA: Diagnosis not present

## 2021-03-06 DIAGNOSIS — S42212A Unspecified displaced fracture of surgical neck of left humerus, initial encounter for closed fracture: Principal | ICD-10-CM | POA: Insufficient documentation

## 2021-03-06 DIAGNOSIS — M25512 Pain in left shoulder: Secondary | ICD-10-CM | POA: Diagnosis present

## 2021-03-06 DIAGNOSIS — Z743 Need for continuous supervision: Secondary | ICD-10-CM | POA: Diagnosis not present

## 2021-03-06 DIAGNOSIS — R000 Tachycardia, unspecified: Secondary | ICD-10-CM | POA: Diagnosis not present

## 2021-03-06 DIAGNOSIS — R4182 Altered mental status, unspecified: Secondary | ICD-10-CM

## 2021-03-06 DIAGNOSIS — R404 Transient alteration of awareness: Secondary | ICD-10-CM | POA: Diagnosis not present

## 2021-03-06 DIAGNOSIS — I639 Cerebral infarction, unspecified: Secondary | ICD-10-CM | POA: Diagnosis not present

## 2021-03-06 DIAGNOSIS — R6889 Other general symptoms and signs: Secondary | ICD-10-CM | POA: Diagnosis not present

## 2021-03-06 DIAGNOSIS — I499 Cardiac arrhythmia, unspecified: Secondary | ICD-10-CM | POA: Diagnosis not present

## 2021-03-06 DIAGNOSIS — S42212D Unspecified displaced fracture of surgical neck of left humerus, subsequent encounter for fracture with routine healing: Secondary | ICD-10-CM | POA: Diagnosis not present

## 2021-03-06 HISTORY — PX: SHOULDER ACROMIOPLASTY: SHX6093

## 2021-03-06 LAB — COMPREHENSIVE METABOLIC PANEL
ALT: 18 U/L (ref 0–44)
AST: 32 U/L (ref 15–41)
Albumin: 3.6 g/dL (ref 3.5–5.0)
Alkaline Phosphatase: 105 U/L (ref 38–126)
Anion gap: 8 (ref 5–15)
BUN: 9 mg/dL (ref 8–23)
CO2: 27 mmol/L (ref 22–32)
Calcium: 8.9 mg/dL (ref 8.9–10.3)
Chloride: 103 mmol/L (ref 98–111)
Creatinine, Ser: 0.6 mg/dL (ref 0.44–1.00)
GFR, Estimated: >60 mL/min (ref >60)
Glucose, Bld: 122 mg/dL — ABNORMAL HIGH (ref 70–99)
Potassium: 3.9 mmol/L (ref 3.5–5.1)
Sodium: 138 mmol/L (ref 135–145)
Total Bilirubin: 1.5 mg/dL — ABNORMAL HIGH (ref 0.3–1.2)
Total Protein: 7.5 g/dL (ref 6.5–8.1)

## 2021-03-06 LAB — URINALYSIS, ROUTINE W REFLEX MICROSCOPIC
Bilirubin Urine: NEGATIVE
Glucose, UA: NEGATIVE mg/dL
Hgb urine dipstick: NEGATIVE
Ketones, ur: NEGATIVE mg/dL
Leukocytes,Ua: NEGATIVE
Nitrite: NEGATIVE
Protein, ur: NEGATIVE mg/dL
Specific Gravity, Urine: 1.025 (ref 1.005–1.030)
pH: 5 (ref 5.0–8.0)

## 2021-03-06 LAB — CBC WITH DIFFERENTIAL/PLATELET
Abs Immature Granulocytes: 0.02 10*3/uL (ref 0.00–0.07)
Basophils Absolute: 0 10*3/uL (ref 0.0–0.1)
Basophils Relative: 0 %
Eosinophils Absolute: 0 10*3/uL (ref 0.0–0.5)
Eosinophils Relative: 0 %
HCT: 39.3 % (ref 36.0–46.0)
Hemoglobin: 13.1 g/dL (ref 12.0–15.0)
Immature Granulocytes: 0 %
Lymphocytes Relative: 14 %
Lymphs Abs: 1 10*3/uL (ref 0.7–4.0)
MCH: 32.6 pg (ref 26.0–34.0)
MCHC: 33.3 g/dL (ref 30.0–36.0)
MCV: 97.8 fL (ref 80.0–100.0)
Monocytes Absolute: 0.8 10*3/uL (ref 0.1–1.0)
Monocytes Relative: 10 %
Neutro Abs: 5.6 10*3/uL (ref 1.7–7.7)
Neutrophils Relative %: 76 %
Platelets: 201 10*3/uL (ref 150–400)
RBC: 4.02 MIL/uL (ref 3.87–5.11)
RDW: 14.6 % (ref 11.5–15.5)
WBC: 7.5 10*3/uL (ref 4.0–10.5)
nRBC: 0 % (ref 0.0–0.2)

## 2021-03-06 LAB — PROTIME-INR
INR: 1.2 (ref 0.8–1.2)
Prothrombin Time: 15.3 seconds — ABNORMAL HIGH (ref 11.4–15.2)

## 2021-03-06 LAB — LACTIC ACID, PLASMA
Lactic Acid, Venous: 1.3 mmol/L (ref 0.5–1.9)
Lactic Acid, Venous: 1.1 mmol/L (ref 0.5–1.9)

## 2021-03-06 LAB — RAPID URINE DRUG SCREEN, HOSP PERFORMED
Amphetamines: NOT DETECTED
Barbiturates: NOT DETECTED
Benzodiazepines: POSITIVE — AB
Cocaine: NOT DETECTED
Opiates: NOT DETECTED
Tetrahydrocannabinol: POSITIVE — AB

## 2021-03-06 LAB — BLOOD GAS, VENOUS
Acid-Base Excess: 3.6 mmol/L — ABNORMAL HIGH (ref 0.0–2.0)
Bicarbonate: 28.2 mmol/L — ABNORMAL HIGH (ref 20.0–28.0)
Drawn by: 6054
FIO2: 21
O2 Saturation: 81.4 %
Patient temperature: 37
pCO2, Ven: 47 mmHg (ref 44.0–60.0)
pH, Ven: 7.395 (ref 7.250–7.430)
pO2, Ven: 47.6 mmHg — ABNORMAL HIGH (ref 32.0–45.0)

## 2021-03-06 LAB — ETHANOL: Alcohol, Ethyl (B): <10 mg/dL (ref <10)

## 2021-03-06 LAB — AMMONIA: Ammonia: 25 umol/L (ref 9–35)

## 2021-03-06 LAB — HIV ANTIBODY (ROUTINE TESTING W REFLEX): HIV Screen 4th Generation wRfx: NONREACTIVE

## 2021-03-06 MED ORDER — ADULT MULTIVITAMIN W/MINERALS CH
1.0000 | ORAL_TABLET | Freq: Every day | ORAL | Status: DC
  Administered 2021-03-06 – 2021-03-11 (×5): 1 via ORAL
  Filled 2021-03-06 (×6): qty 1

## 2021-03-06 MED ORDER — THIAMINE HCL 100 MG PO TABS
100.0000 mg | ORAL_TABLET | Freq: Every day | ORAL | Status: DC
  Administered 2021-03-07 – 2021-03-11 (×4): 100 mg via ORAL
  Filled 2021-03-06 (×5): qty 1

## 2021-03-06 MED ORDER — NALOXONE HCL 0.4 MG/ML IJ SOLN: 0.2000 mg | Freq: Once | INTRAMUSCULAR | Status: DC

## 2021-03-06 MED ORDER — ENOXAPARIN SODIUM 40 MG/0.4ML IJ SOSY
40.0000 mg | PREFILLED_SYRINGE | INTRAMUSCULAR | Status: DC
  Administered 2021-03-06 – 2021-03-10 (×5): 40 mg via SUBCUTANEOUS
  Filled 2021-03-06 (×5): qty 0.4

## 2021-03-06 MED ORDER — ONDANSETRON HCL 4 MG/2ML IJ SOLN: 4.0000 mg | Freq: Four times a day (QID) | INTRAMUSCULAR | Status: DC | PRN

## 2021-03-06 MED ORDER — DULOXETINE HCL 60 MG PO CPEP
60.0000 mg | ORAL_CAPSULE | Freq: Every day | ORAL | Status: DC
  Administered 2021-03-07 – 2021-03-11 (×4): 60 mg via ORAL
  Filled 2021-03-06 (×5): qty 1

## 2021-03-06 MED ORDER — NALOXONE HCL 0.4 MG/ML IJ SOLN: 0.4000 mg | INTRAMUSCULAR | Status: DC | PRN

## 2021-03-06 MED ORDER — LORAZEPAM 2 MG/ML IJ SOLN
1.0000 mg | INTRAMUSCULAR | Status: DC | PRN
Start: 2021-03-06 — End: 2021-03-06
  Administered 2021-03-06: 2 mg via INTRAVENOUS
  Filled 2021-03-06: qty 1

## 2021-03-06 MED ORDER — THIAMINE HCL 100 MG/ML IJ SOLN
100.0000 mg | Freq: Every day | INTRAMUSCULAR | Status: DC
  Administered 2021-03-06: 100 mg via INTRAVENOUS
  Filled 2021-03-06: qty 2

## 2021-03-06 MED ORDER — ALBUTEROL SULFATE (2.5 MG/3ML) 0.083% IN NEBU
2.5000 mg | INHALATION_SOLUTION | Freq: Four times a day (QID) | RESPIRATORY_TRACT | Status: DC
  Administered 2021-03-07: 2.5 mg via RESPIRATORY_TRACT
  Filled 2021-03-06 (×2): qty 3

## 2021-03-06 MED ORDER — FOLIC ACID 1 MG PO TABS
1.0000 mg | ORAL_TABLET | Freq: Every day | ORAL | Status: DC
  Administered 2021-03-06 – 2021-03-11 (×5): 1 mg via ORAL
  Filled 2021-03-06 (×6): qty 1

## 2021-03-06 MED ORDER — SODIUM CHLORIDE 0.9% FLUSH
3.0000 mL | Freq: Two times a day (BID) | INTRAVENOUS | Status: DC
  Administered 2021-03-06 – 2021-03-11 (×10): 3 mL via INTRAVENOUS

## 2021-03-06 MED ORDER — ONDANSETRON HCL 4 MG PO TABS: 4.0000 mg | ORAL_TABLET | Freq: Four times a day (QID) | ORAL | Status: DC | PRN

## 2021-03-06 MED ORDER — LORAZEPAM 1 MG PO TABS: 1.0000 mg | ORAL_TABLET | ORAL | Status: DC | PRN

## 2021-03-06 MED ORDER — GABAPENTIN 100 MG PO CAPS
100.0000 mg | ORAL_CAPSULE | Freq: Three times a day (TID) | ORAL | Status: DC
  Administered 2021-03-07 – 2021-03-11 (×13): 100 mg via ORAL
  Filled 2021-03-06 (×14): qty 1

## 2021-03-06 MED ORDER — SODIUM CHLORIDE 0.9 % IV SOLN: Freq: Once | INTRAVENOUS | Status: AC

## 2021-03-06 NOTE — ED Notes (Signed)
Tried to call report. Secretary said nurse will call back.

## 2021-03-06 NOTE — ED Triage Notes (Signed)
Patient BIB [facility] for left shoulder pain. Patient has known fracture and has appointment with orthopedics for surgical consult. 18g saline lock in right wrist.

## 2021-03-06 NOTE — Progress Notes (Signed)
Pt noted to not have COVID test since admission to ED. Dr. Cyd Silence informed and I request x3 for order.

## 2021-03-06 NOTE — H&P (Addendum)
History and Physical    Kelly Singh MVE:[id number] DOB: March 13, 1956 DOA: 03/06/2021  Referring MD/NP/PA: Florian Buff, MD PCP: Janie Morning, DO  Patient coming from: home  Chief Complaint: Altered  I have personally briefly reviewed patient's old medical records in [place]   HPI: Kelly Singh is a 65 y.o. female with medical history significant of substance abuse, anxiety, depression, and left humerus fracture after fall on 5/22 presents after being noted to be significantly more confused than her baseline over the last several days.  Records as the patient's daughter is not available by the number on file at this time.  It was reported that the patient had taken any narcotics in the last day.  ED Course: Upon admission to the emergency department patient was seen to be afebrile with respirations 19-22, blood pressure 142/104 -163/106, and all other vital signs maintained.  Labs including CBC, CMP, lactic acid, and ammonia level relatively within normal limits except for total bilirubin 1.5.  Alcohol level was undetectable.  CT scan of the head showed no acute abnormalities.  As of the left shoulder redemonstrated a left humeral surgical neck fracture.  Chest x-ray noted minimal infiltrates of the right upper lobe and left lower lobe that could not be excluded.  Urinalysis showed no acute abnormalities.  TRH called to admit.  Review of Systems  Unable to perform ROS: Mental status change    Past Medical History:  Diagnosis Date  . Arthritis   . Ataxia   . Cirrhosis ([place])   . Depression with anxiety   . Fibromyalgia   . Gallbladder sludge   . Hiatal hernia   . Migraine   . Substance abuse ([place])    in 1970's    Past Surgical History:  Procedure Laterality Date  . ANKLE SURGERY Right 2011  . BREAST ENHANCEMENT SURGERY  1996  . TUBAL LIGATION  1979  . WRIST SURGERY Right 2016     reports that she quit smoking about 24 years ago. She has never used smokeless  tobacco. She reports current alcohol use. She reports that she does not use drugs.  Allergies  Allergen Reactions  . Latex Itching, Swelling, Rash and Other (See Comments)    Eats through skin; blister   . Adhesive [Tape] Other (See Comments)    Plastic tape eats through skin - pls use paper tape    Family History  Problem Relation Age of Onset  . Bone cancer Mother   . Esophageal cancer Father     Prior to Admission medications   Medication Sig Start Date End Date Taking? Authorizing Provider  baclofen (LIORESAL) 20 MG tablet Take 20 mg by mouth as needed for muscle spasms.   Yes [provider]  DULoxetine (CYMBALTA) 60 MG capsule Take 1 capsule (60 mg total) by mouth daily. 09/13/20  Yes Marcial Pacas, MD  gabapentin (NEURONTIN) 100 MG capsule Take 1 capsule (100 mg total) by mouth 3 (three) times daily. 09/13/20  Yes Marcial Pacas, MD  HYDROcodone-acetaminophen (NORCO/VICODIN) 5-325 MG tablet Take 1 tablet by mouth every 6 (six) hours as needed. Patient taking differently: Take 1 tablet by mouth every 6 (six) hours as needed (pain). 02/25/21  Yes Volanda Napoleon, PA-C  Omega-3 Fatty Acids (FISH OIL PO) Take 1 capsule by mouth daily.   Yes [provider]  Vitamin D, Cholecalciferol, 25 MCG (1000 UT) TABS Take 1 tablet by mouth daily.   Yes [provider]  ondansetron (ZOFRAN ODT) 4 MG  disintegrating tablet Take 1 tablet (4 mg total) by mouth every 8 (eight) hours as needed for nausea or vomiting. Patient not taking: No sig reported 02/25/21   Volanda Napoleon, PA-C    Physical Exam:  Constitutional: Lethargic elderly female not responding to verbal stimuli at this time Vitals:   03/06/21 1334 03/06/21 1430 03/06/21 1445  BP: (!) 163/106 (!) 158/96 (!) 142/104  Pulse: 97 99 98  Resp: (!) 22 (!) 21 (!) 22  Temp: 98 F (36.7 C)    TempSrc: Oral    SpO2: 98% 98% 99%   Eyes: PERRL, lids and conjunctivae normal ENMT: Mucous membranes are dry. Posterior  pharynx clear of any exudate or lesions.  Neck: normal, supple, no masses, no thyromegaly Respiratory: clear to auscultation bilaterally, no wheezing, no crackles. Normal respiratory effort. No accessory muscle use.  Cardiovascular: Regular rate and rhythm, no murmurs / rubs / gallops. No extremity edema. 2+ pedal pulses. No carotid bruits.  Abdomen: no tenderness, no masses palpated. No hepatosplenomegaly. Bowel sounds positive.  Musculoskeletal: no clubbing / cyanosis. No joint deformity upper and lower extremities. Good ROM, no contractures. Normal muscle tone.  Skin: no rashes, lesions, ulcers. No induration Neurologic: CN 2-12 grossly intact. Sensation intact, DTR normal. Strength 5/5 in all 4.  Psychiatric: Normal judgment and insight. Alert and oriented x 3. Normal mood.     Labs on Admission: I have personally reviewed following labs and imaging studies  CBC: Recent Labs  Lab 03/06/21 1336  WBC 7.5  NEUTROABS 5.6  HGB 13.1  HCT 39.3  MCV 97.8  PLT 824   Basic Metabolic Panel: Recent Labs  Lab 03/06/21 1336  NA 138  K 3.9  CL 103  CO2 27  GLUCOSE 122*  BUN 9  CREATININE 0.60  CALCIUM 8.9   GFR: Estimated Creatinine Clearance: 65.3 mL/min (by C-G formula based on SCr of 0.6 mg/dL). Liver Function Tests: Recent Labs  Lab 03/06/21 1336  AST 32  ALT 18  ALKPHOS 105  BILITOT 1.5*  PROT 7.5  ALBUMIN 3.6   No results for input(s): LIPASE, AMYLASE in the last 168 hours. Recent Labs  Lab 03/06/21 1336  AMMONIA 25   Coagulation Profile: Recent Labs  Lab 03/06/21 1336  INR 1.2   Cardiac Enzymes: No results for input(s): CKTOTAL, CKMB, CKMBINDEX, TROPONINI in the last 168 hours. BNP (last 3 results) No results for input(s): PROBNP in the last 8760 hours. HbA1C: No results for input(s): HGBA1C in the last 72 hours. CBG: No results for input(s): GLUCAP in the last 168 hours. Lipid Profile: No results for input(s): CHOL, HDL, LDLCALC, TRIG, CHOLHDL,  LDLDIRECT in the last 72 hours. Thyroid Function Tests: No results for input(s): TSH, T4TOTAL, FREET4, T3FREE, THYROIDAB in the last 72 hours. Anemia Panel: No results for input(s): VITAMINB12, FOLATE, FERRITIN, TIBC, IRON, RETICCTPCT in the last 72 hours. Urine analysis:    Component Value Date/Time   COLORURINE YELLOW 01/21/2021 1915   APPEARANCEUR CLEAR 01/21/2021 1915   LABSPEC 1.015 01/21/2021 1915   PHURINE 7.0 01/21/2021 1915   GLUCOSEU NEGATIVE 01/21/2021 [place] NEGATIVE 01/21/2021 [place] 01/21/2021 [place] 01/21/2021 1915   PROTEINUR NEGATIVE 01/21/2021 1915   NITRITE NEGATIVE 01/21/2021 1915   LEUKOCYTESUR NEGATIVE 01/21/2021 1915   Sepsis Labs: No results found for this or any previous visit (from the past 240 hour(s)).   Radiological Exams on Admission: CT Head Wo Contrast  Result Date: 03/06/2021  CLINICAL DATA:  Delirium, altered mental status EXAM: CT HEAD WITHOUT CONTRAST TECHNIQUE: Contiguous axial images were obtained from the base of the skull through the vertex without intravenous contrast. COMPARISON:  01/22/2020 FINDINGS: Brain: No evidence of acute infarction, hemorrhage, extra-axial collection, ventriculomegaly, or mass effect. Generalized cerebral atrophy. Periventricular white matter low attenuation likely secondary to microangiopathy. Vascular: Cerebrovascular atherosclerotic calcifications are noted. Skull: Negative for fracture or focal lesion. Sinuses/Orbits: Visualized portions of the orbits are unremarkable. Visualized portions of the paranasal sinuses are unremarkable. Visualized portions of the mastoid air cells are unremarkable. Other: None. IMPRESSION: 1. No acute intracranial pathology. 2. Chronic microvascular disease and cerebral atrophy. Electronically Signed   By: Kathreen Devoid   On: 03/06/2021 14:33   DG Chest Port 1 View  Result Date: 03/06/2021 CLINICAL DATA:  Weakness. EXAM: PORTABLE CHEST 1 VIEW  COMPARISON:  CT 12/12/2019. FINDINGS: Mediastinum and hilar structures normal. Heart size normal. Minimal infiltrates in the left lung base and right upper lung cannot be excluded. No pleural effusion or pneumothorax. Degenerative change thoracic spine. IMPRESSION: Minimal infiltrates in the left lung base and right upper lung cannot be excluded. Electronically Signed   By: Marcello Moores  Register   On: 03/06/2021 13:47   DG Shoulder Left  Result Date: 03/04/2021 CLINICAL DATA:  History of proximal humeral fracture, worsening pain, fall EXAM: LEFT SHOULDER - 2+ VIEW COMPARISON:  02/26/2019 FINDINGS: Redemonstrated distracted, displaced fracture of the surgical neck of the left humerus. The glenohumeral joint remains in apposition. The acromioclavicular joint is intact. IMPRESSION: Redemonstrated distracted, displaced fracture of the surgical neck of the left humerus. The glenohumeral joint remains in apposition. Electronically Signed   By: Eddie Candle M.D.   On: 03/04/2021 17:59    EKG: Independently reviewed.  Sinus tachycardia 102 bpm  Assessment/Plan Acute metabolic encephalopathy: Patient presents after reportedly being more altered over the last several days.  Reportedly she had not taken narcotics in the last day.  Ammonia level was within normal limits CT scan of the brain showed no acute abnormalities.  Work-up negative so far without clear cause for patient's symptoms. -Admit to medical telemetry bed -Neurochecks -Continuous pulse oximetry with nasal cannula oxygen as needed to maintain O2 saturation greater than 92% -Follow-up urinalysis and urine drug screen -Check blood culture, TSH, and venous blood gas -Check MRI and EEG of the brain  -Normal saline IV fluids at 75 mL/h -Holding sedating medications  Left humerus fracture secondary to fall: Prior to arrival.  Patient reportedly fell on 5/22 and had sustained left displaced fracture of the surgical neck of the left humerus.  She is  currently in a sling and had follow-up with [facility] on 5/26.  At that time recommended to continue immobilization with gentle range of motion of the wrist and elbow follow-up with them sometime this week for repeat imaging. -Continue left arm sling -Holding sedating medications at this time  Abnormal chest x-ray: Chest x-ray could not rule out infiltrates in the left lung base and right upper lobe lung. -Check procalcitonin -Will monitor off of antibiotics at this time  History of chronic hepatitis C  Polysubstance abuse  history of alcohol abuse: Initial alcohol level was undetectable. -CIWA protocol had initially been initiated with as needed Ativan, but as needed Ativan was discontinued as patient was too somnolent -Consider adding back on Ativan if needed overnight for elevated CIWA  DVT prophylaxis: Lovenox Code Status: Full Family Communication: Attempted to call daughter but no answer Disposition Plan: To  be determined Consults called: None Admission status: Observation, requiring hopefully less than 24 hours today  Norval Morton MD [facility]   If 7PM-7AM, please contact night-coverage   03/06/2021, 3:54 PM

## 2021-03-06 NOTE — ED Notes (Signed)
Pt moving around in bed, continuously stating she needs to get out of bed to "pee"; pt told she can not get out of bed due to her condition; pt informed she has the [place] but states she can not use it; pt placed on bedpan with no success

## 2021-03-06 NOTE — ED Provider Notes (Addendum)
[facility] EMERGENCY DEPARTMENT Provider Note   CSN: [id number] Arrival date & time: 03/06/21  1309     History Chief Complaint  Patient presents with  . Shoulder Pain    Kelly Singh is a 65 y.o. female.  65 year old female with prior medical history as detailed below presents for evaluation.  Patient with recent left proximal humerus fracture.  Patient presents from home today with reported alteration in her mental status.  EMS reports that the daughter who is at home has noticed the patient has been significantly more confused than her baseline over the last several days.  [name] arrived and provides additional history.  She reports that the patient has had significant confusion over the last several days.  This does not appear to correlate with use of narcotics for recently diagnosed left humerus fracture.  The patient is somnolent upon arrival to the ED.  She is unable to provide significant history at this time.  Level 5 caveat secondary to same.  The history is provided by the patient, medical records and a relative.  Illness Location:  AMS, confusion Severity:  Moderate Onset quality:  Gradual Duration:  5 days Timing:  Intermittent Progression:  Waxing and waning Chronicity:  New      Past Medical History:  Diagnosis Date  . Arthritis   . Ataxia   . Cirrhosis ([place])   . Depression with anxiety   . Fibromyalgia   . Gallbladder sludge   . Hiatal hernia   . Migraine   . Substance abuse ([place])    in 1970's    Patient Active Problem List   Diagnosis Date Noted  . Cerebellar ataxia in diseases classified elsewhere ([place]) 09/13/2020  . Chronic low back pain 09/13/2020  . Slurred speech 03/08/2020  . Other cirrhosis of liver ([place]) 12/05/2019  . Dizzy 11/09/2019  . Fall 11/09/2019  . Hypomagnesemia 11/09/2019  . Scalp laceration 11/09/2019  . Alcohol intoxication ([place]) 11/09/2019  . Hypokalemia   . Gait abnormality  11/03/2019  . Chronic hepatitis C without hepatic coma ([place]) 11/01/2019    Past Surgical History:  Procedure Laterality Date  . ANKLE SURGERY Right 2011  . BREAST ENHANCEMENT SURGERY  1996  . TUBAL LIGATION  1979  . WRIST SURGERY Right 2016     OB History   No obstetric history on file.     Family History  Problem Relation Age of Onset  . Bone cancer Mother   . Esophageal cancer Father     Social History   Tobacco Use  . Smoking status: Former Smoker    Quit date: 1998    Years since quitting: 24.4  . Smokeless tobacco: Never Used  Vaping Use  . Vaping Use: Never used  Substance Use Topics  . Alcohol use: Yes    Comment: occasional  . Drug use: Never    Home Medications Prior to Admission medications   Medication Sig Start Date End Date Taking? Authorizing Provider  baclofen (LIORESAL) 20 MG tablet Take 20 mg by mouth as needed for muscle spasms.   Yes [provider]  DULoxetine (CYMBALTA) 60 MG capsule Take 1 capsule (60 mg total) by mouth daily. 09/13/20  Yes Marcial Pacas, MD  gabapentin (NEURONTIN) 100 MG capsule Take 1 capsule (100 mg total) by mouth 3 (three) times daily. 09/13/20  Yes Marcial Pacas, MD  HYDROcodone-acetaminophen (NORCO/VICODIN) 5-325 MG tablet Take 1 tablet by mouth every 6 (six) hours as needed. Patient taking differently: Take 1  tablet by mouth every 6 (six) hours as needed (pain). 02/25/21  Yes Volanda Napoleon, PA-C  Omega-3 Fatty Acids (FISH OIL PO) Take 1 capsule by mouth daily.   Yes [provider]  Vitamin D, Cholecalciferol, 25 MCG (1000 UT) TABS Take 1 tablet by mouth daily.   Yes [provider]  ondansetron (ZOFRAN ODT) 4 MG disintegrating tablet Take 1 tablet (4 mg total) by mouth every 8 (eight) hours as needed for nausea or vomiting. Patient not taking: No sig reported 02/25/21   [place] Lanius A, PA-C    Allergies    Latex and Adhesive [tape]  Review of Systems   Review of Systems  All other systems  reviewed and are negative.   Physical Exam Updated Vital Signs BP (!) 142/104   Pulse 98   Temp 98 F (36.7 C) (Oral)   Resp (!) 22   SpO2 99%   Physical Exam Vitals and nursing note reviewed.  Constitutional:      General: She is not in acute distress.    Appearance: She is well-developed.     Comments: Alert, but somnolent   HENT:     Head: Normocephalic and atraumatic.  Eyes:     Conjunctiva/sclera: Conjunctivae normal.     Pupils: Pupils are equal, round, and reactive to light.  Cardiovascular:     Rate and Rhythm: Normal rate and regular rhythm.     Heart sounds: Normal heart sounds.  Pulmonary:     Effort: Pulmonary effort is normal. No respiratory distress.     Breath sounds: Normal breath sounds.  Abdominal:     General: There is no distension.     Palpations: Abdomen is soft.     Tenderness: There is no abdominal tenderness.  Musculoskeletal:        General: No deformity. Normal range of motion.     Cervical back: Normal range of motion and neck supple.     Comments: Tender to left prox humerus   Skin:    General: Skin is warm and dry.  Neurological:     General: No focal deficit present.     Mental Status: She is alert.     Comments: Alert, but somnolent  No focal deficit      ED Results / Procedures / Treatments   Labs (all labs ordered are listed, but only abnormal results are displayed) Labs Reviewed  COMPREHENSIVE METABOLIC PANEL - Abnormal; Notable for the following components:      Result Value   Glucose, Bld 122 (*)    Total Bilirubin 1.5 (*)    All other components within normal limits  PROTIME-INR - Abnormal; Notable for the following components:   Prothrombin Time 15.3 (*)    All other components within normal limits  AMMONIA  ETHANOL  CBC WITH DIFFERENTIAL/PLATELET  LACTIC ACID, PLASMA  LACTIC ACID, PLASMA  RAPID URINE DRUG SCREEN, HOSP PERFORMED    EKG EKG Interpretation  Date/Time:  Wednesday March 06 2021 13:26:38  EDT Ventricular Rate:  102 PR Interval:  151 QRS Duration: 83 QT Interval:  363 QTC Calculation: 473 R Axis:   64 Text Interpretation: Sinus tachycardia Confirmed by Dene Gentry [phone]) on 03/06/2021 1:32:05 PM   Radiology CT Head Wo Contrast  Result Date: 03/06/2021 CLINICAL DATA:  Delirium, altered mental status EXAM: CT HEAD WITHOUT CONTRAST TECHNIQUE: Contiguous axial images were obtained from the base of the skull through the vertex without intravenous contrast. COMPARISON:  01/22/2020 FINDINGS: Brain: No evidence of  acute infarction, hemorrhage, extra-axial collection, ventriculomegaly, or mass effect. Generalized cerebral atrophy. Periventricular white matter low attenuation likely secondary to microangiopathy. Vascular: Cerebrovascular atherosclerotic calcifications are noted. Skull: Negative for fracture or focal lesion. Sinuses/Orbits: Visualized portions of the orbits are unremarkable. Visualized portions of the paranasal sinuses are unremarkable. Visualized portions of the mastoid air cells are unremarkable. Other: None. IMPRESSION: 1. No acute intracranial pathology. 2. Chronic microvascular disease and cerebral atrophy. Electronically Signed   By: Kathreen Devoid   On: 03/06/2021 14:33   DG Chest Port 1 View  Result Date: 03/06/2021 CLINICAL DATA:  Weakness. EXAM: PORTABLE CHEST 1 VIEW COMPARISON:  CT 12/12/2019. FINDINGS: Mediastinum and hilar structures normal. Heart size normal. Minimal infiltrates in the left lung base and right upper lung cannot be excluded. No pleural effusion or pneumothorax. Degenerative change thoracic spine. IMPRESSION: Minimal infiltrates in the left lung base and right upper lung cannot be excluded. Electronically Signed   By: Marcello Moores  Register   On: 03/06/2021 13:47   DG Shoulder Left  Result Date: 03/04/2021 CLINICAL DATA:  History of proximal humeral fracture, worsening pain, fall EXAM: LEFT SHOULDER - 2+ VIEW COMPARISON:  02/26/2019 FINDINGS:  Redemonstrated distracted, displaced fracture of the surgical neck of the left humerus. The glenohumeral joint remains in apposition. The acromioclavicular joint is intact. IMPRESSION: Redemonstrated distracted, displaced fracture of the surgical neck of the left humerus. The glenohumeral joint remains in apposition. Electronically Signed   By: Eddie Candle M.D.   On: 03/04/2021 17:59    Procedures Procedures   Medications Ordered in ED Medications - No data to display  ED Course  I have reviewed the triage vital signs and the nursing notes.  Pertinent labs & imaging results that were available during my care of the patient were reviewed by me and considered in my medical decision making (see chart for details).    MDM Rules/Calculators/A&P                          MDM  MSE complete  Natonya Finstad was evaluated in Emergency Department on 03/06/2021 for the symptoms described in the history of present illness. She was evaluated in the context of the global COVID-19 pandemic, which necessitated consideration that the patient might be at risk for infection with the SARS-CoV-2 virus that causes COVID-19. Institutional protocols and algorithms that pertain to the evaluation of patients at risk for COVID-19 are in a state of rapid change based on information released by regulatory bodies including the CDC and federal and state organizations. These policies and algorithms were followed during the patient's care in the ED.   Patient is presenting for reported alteration in mental status.  Patient with several days of increasing confusion.  History is obtained from patient's daughter.  Patient without focal findings on exam.  Initial imaging and labs are without significant abnormality.  Suspect possible side effect from medications.    However, patient is a significant fall risk and currently cannot ambulate in a safe manner.   Hospitalist service aware of case and will evaluate for  admission.  Final Clinical Impression(s) / ED Diagnoses Final diagnoses:  Altered mental status, unspecified altered mental status type    Rx / DC Orders ED Discharge Orders    None       Valarie Merino, MD 03/06/21 1555    Valarie Merino, MD 03/06/21 1818

## 2021-03-07 DIAGNOSIS — S42212D Unspecified displaced fracture of surgical neck of left humerus, subsequent encounter for fracture with routine healing: Secondary | ICD-10-CM | POA: Diagnosis not present

## 2021-03-07 DIAGNOSIS — G9341 Metabolic encephalopathy: Secondary | ICD-10-CM

## 2021-03-07 DIAGNOSIS — B182 Chronic viral hepatitis C: Secondary | ICD-10-CM | POA: Diagnosis not present

## 2021-03-07 DIAGNOSIS — F1910 Other psychoactive substance abuse, uncomplicated: Secondary | ICD-10-CM | POA: Diagnosis not present

## 2021-03-07 DIAGNOSIS — R5381 Other malaise: Secondary | ICD-10-CM | POA: Diagnosis not present

## 2021-03-07 LAB — BASIC METABOLIC PANEL
Anion gap: 8 (ref 5–15)
BUN: 11 mg/dL (ref 8–23)
CO2: 26 mmol/L (ref 22–32)
Calcium: 8.6 mg/dL — ABNORMAL LOW (ref 8.9–10.3)
Chloride: 102 mmol/L (ref 98–111)
Creatinine, Ser: 0.53 mg/dL (ref 0.44–1.00)
GFR, Estimated: >60 mL/min (ref >60)
Glucose, Bld: 107 mg/dL — ABNORMAL HIGH (ref 70–99)
Potassium: 4.4 mmol/L (ref 3.5–5.1)
Sodium: 136 mmol/L (ref 135–145)

## 2021-03-07 LAB — CBC
HCT: 36.5 % (ref 36.0–46.0)
Hemoglobin: 12.3 g/dL (ref 12.0–15.0)
MCH: 32.5 pg (ref 26.0–34.0)
MCHC: 33.7 g/dL (ref 30.0–36.0)
MCV: 96.3 fL (ref 80.0–100.0)
Platelets: 164 10*3/uL (ref 150–400)
RBC: 3.79 MIL/uL — ABNORMAL LOW (ref 3.87–5.11)
RDW: 14.8 % (ref 11.5–15.5)
WBC: 5.4 10*3/uL (ref 4.0–10.5)
nRBC: 0 % (ref 0.0–0.2)

## 2021-03-07 LAB — RESP PANEL BY RT-PCR (FLU A&B, COVID) ARPGX2
Influenza A by PCR: NEGATIVE
Influenza B by PCR: NEGATIVE
SARS Coronavirus 2 by RT PCR: NEGATIVE

## 2021-03-07 LAB — TSH: TSH: 2.184 u[IU]/mL (ref 0.350–4.500)

## 2021-03-07 LAB — MAGNESIUM: Magnesium: 2.1 mg/dL (ref 1.7–2.4)

## 2021-03-07 LAB — PROCALCITONIN: Procalcitonin: <0.1 ng/mL

## 2021-03-07 LAB — PHOSPHORUS: Phosphorus: 4 mg/dL (ref 2.5–4.6)

## 2021-03-07 MED ORDER — ACETAMINOPHEN 325 MG PO TABS
650.0000 mg | ORAL_TABLET | Freq: Four times a day (QID) | ORAL | Status: DC | PRN
  Administered 2021-03-07 – 2021-03-08 (×3): 650 mg via ORAL
  Filled 2021-03-07 (×3): qty 2

## 2021-03-07 NOTE — Care Management Obs Status (Signed)
[place] NOTIFICATION   Patient Details  Name: Siyah Mault MRN: [id number] Date of Birth: Aug 26, 1956   Medicare Observation Status Notification Given:  Yes    Geralynn Ochs, LCSW 03/07/2021, 4:29 PM

## 2021-03-07 NOTE — NC FL2 (Signed)
[place] LEVEL OF CARE SCREENING TOOL     IDENTIFICATION  Patient Name: Kelly Singh Birthdate: [birth year] Sex: female Admission Date (Current Location): 03/06/2021  [facility] and [place] Number:  Herbalist and Address:  [facility] [place], [place] [address], [place], [place] [address]      Provider Number: [id number]  Attending Physician Name and Address:  Flora Lipps, MD  Relative Name and Phone Number:       Current Level of Care: Hospital Recommended Level of Care: [place] Prior Approval Number:    Date Approved/Denied:   PASRR Number: [id number]  Discharge Plan: SNF    Current Diagnoses: Patient Active Problem List   Diagnosis Date Noted  . Acute metabolic encephalopathy 55/73/2202  . Closed fracture of humerus, surgical neck 03/06/2021  . Polysubstance abuse ([place]) 03/06/2021  . Cerebellar ataxia in diseases classified elsewhere ([place]) 09/13/2020  . Chronic low back pain 09/13/2020  . Slurred speech 03/08/2020  . Other cirrhosis of liver ([place]) 12/05/2019  . Dizzy 11/09/2019  . Fall 11/09/2019  . Hypomagnesemia 11/09/2019  . Scalp laceration 11/09/2019  . Alcohol intoxication ([place]) 11/09/2019  . Hypokalemia   . Gait abnormality 11/03/2019  . Chronic hepatitis C without hepatic coma ([place]) 11/01/2019    Orientation RESPIRATION BLADDER Height & Weight     Self,Place  Normal Incontinent Weight: 129 lb 13.6 oz (58.9 kg) Height:  5\' 8"  (172.7 cm)  BEHAVIORAL SYMPTOMS/MOOD NEUROLOGICAL BOWEL NUTRITION STATUS      Continent Diet (regular)  AMBULATORY STATUS COMMUNICATION OF NEEDS Skin   Limited Assist Verbally Normal                       Personal Care Assistance Level of Assistance  Bathing,Feeding,Dressing Bathing Assistance: Limited assistance Feeding assistance: Limited assistance Dressing Assistance: Limited assistance     Functional Limitations Info             SPECIAL CARE  FACTORS FREQUENCY  PT (By licensed PT),OT (By licensed OT)     PT Frequency: 5x/wk OT Frequency: 5x/wk            Contractures Contractures Info: Not present    Additional Factors Info  Code Status,Allergies,Psychotropic Code Status Info: Full Allergies Info: Latex, Adhesive (Tape) Psychotropic Info: Cymbalta 60 mg         Current Medications (03/07/2021):  This is the current hospital active medication list Current Facility-Administered Medications  Medication Dose Route Frequency Provider Last Rate Last Admin  . acetaminophen (TYLENOL) tablet 650 mg  650 mg Oral Q6H PRN Pokhrel, Laxman, MD   650 mg at 03/07/21 1134  . DULoxetine (CYMBALTA) DR capsule 60 mg  60 mg Oral Daily Fuller Plan A, MD   60 mg at 03/07/21 0825  . enoxaparin (LOVENOX) injection 40 mg  40 mg Subcutaneous Q24H Tamala Julian, Rondell A, MD   40 mg at 03/06/21 1748  . folic acid (FOLVITE) tablet 1 mg  1 mg Oral Daily Tamala Julian, Rondell A, MD   1 mg at 03/07/21 0824  . gabapentin (NEURONTIN) capsule 100 mg  100 mg Oral TID Fuller Plan A, MD   100 mg at 03/07/21 0824  . multivitamin with minerals tablet 1 tablet  1 tablet Oral Daily Fuller Plan A, MD   1 tablet at 03/07/21 0824  . naloxone (NARCAN) injection 0.4 mg  0.4 mg Intravenous PRN Fuller Plan A, MD      . ondansetron [facility]) tablet  4 mg  4 mg Oral Q6H PRN Fuller Plan A, MD       Or  . ondansetron (ZOFRAN) injection 4 mg  4 mg Intravenous Q6H PRN Smith, Rondell A, MD      . sodium chloride flush (NS) 0.9 % injection 3 mL  3 mL Intravenous Q12H Smith, Rondell A, MD   3 mL at 03/07/21 1034  . thiamine tablet 100 mg  100 mg Oral Daily Fuller Plan A, MD   100 mg at 03/07/21 3735     Discharge Medications: Please see discharge summary for a list of discharge medications.  Relevant Imaging Results:  Relevant Lab Results:   Additional Information SS#: [id number]  Geralynn Ochs, LCSW

## 2021-03-07 NOTE — Plan of Care (Signed)

## 2021-03-07 NOTE — Progress Notes (Signed)
PROGRESS NOTE  Kelly Singh MEQ:[id number] DOB: 1956/07/21 DOA: 03/06/2021 PCP: Janie Morning, DO   LOS: 0 days   Brief narrative: Kelly Singh is a 65 y.o. female with medical history significant of substance abuse, anxiety, depression, and left humerus fracture after fall on 5/22 presented to the hospital after being noted to be significantly more confused than her baseline over the last several days.   It was reported that the patient had taken any narcotics in the last day.  In the ED patient was afebrile blood pressure is slightly elevated.  Labs were unremarkable.  Bilirubin was slightly elevated.  Alcohol level was undetectable.  CT head scan did not show any acute abnormality.  Left shoulder x-ray showed left humeral surgical neck fracture.  Chest x-ray showed minimal infiltrates.  Urinalysis was negative.  Patient was then admitted hospital for further evaluation and treatment  Assessment/Plan:  Principal Problem:   Acute metabolic encephalopathy Active Problems:   Chronic hepatitis C without hepatic coma (HCC)   Closed fracture of humerus, surgical neck   Polysubstance abuse ([place])  Acute metabolic encephalopathy:  Had been having some confusion for the last few days mostly in the last 1 day.  Ammonia level was normal CT head scan was negative.  Urinalysis was negative as well.  Chest x-ray showed some infiltrate but no fever or cough or shortness of breath.  TSH was within normal limits.  Procalcitonin was less than 0.10.  Encephalopathy improved with conservative treatment.  History of chronic hepatitis C in the past.  Patient denies current liver problem.  MRI of the brain did not show any acute ischemia but was a motion degraded study.  Left humerus fracture secondary to fall: Prior to arrival.  Patient had sustained a fall on 02/24/2021 with left displaced surgical neck fracture of the humerus and is following at the [facility]. Recommendation is to continue  immobilization on the sling.  Patient does have an appointment to follow-up with orthopedics as outpatient for definitive treatment.  Physical therapy was consulted who recommended skilled nursing facility placement for rehabilitation.  Abnormal chest x-ray: Chest x-ray could not rule out infiltrates in the left lung base and right upper lobe lung.  Procalcitonin was negative.  Patient denies any cough fever chest pain or shortness of breath.  History of chronic hepatitis C It appears that patient might have had a treatment for hepatitis C in the past as per her history.  Polysubstance abuse  history of alcohol abuse: Initial alcohol level was undetectable.  She states that she drinks especially over the weekends.  Initially CIWA protocol was initiated but due to sedation it was discontinued.  Needed Ativan if signs of anxiety restlessness or withdrawal.  Continue thiamine and folic acid.  Debility, deconditioning, recent fall.  Physical therapy was consulted who recommended skilled nursing facility placement for rehabilitation.  Transition of care has been consulted.  History of fibromyalgia depression. Continue duloxetine, gabapentin.  DVT prophylaxis: enoxaparin (LOVENOX) injection 40 mg Start: 03/06/21 1730   Code Status: Full code  Family Communication: Spoke with the patient's daughter at bedside.  Status is: Observation  The patient will require care spanning > 2 midnights and should be moved to inpatient because: Unsafe d/c plan, IV treatments appropriate due to intensity of illness or inability to take PO, Inpatient level of care appropriate due to severity of illness and Need for rehabilitation  Dispo: The patient is from: Home  Anticipated d/c is to: SNF              Patient currently is not medically stable to d/c.   Difficult to place patient No   Consultants:  None  Procedures:  None  Anti-infectives:  . None  Anti-infectives (From admission,  onward)   None       Subjective: Today, patient was seen and examined at bedside.  Mild discomfort in the arm.  Having trouble finding some words..  Objective: Vitals:   03/07/21 1100 03/07/21 1500  BP: (!) 145/81 (!) 143/90  Pulse: 96 81  Resp: 19 20  Temp:    SpO2: 100% 100%   No intake or output data in the 24 hours ending 03/07/21 1603 Filed Weights   03/06/21 2137  Weight: 58.9 kg   Body mass index is 19.74 kg/m.   Physical Exam: GENERAL: Patient is alert awake and oriented. Not in obvious distress.  Mildly word finding difficulty. HENT: No scleral pallor or icterus. Pupils equally reactive to light. Oral mucosa is moist NECK: is supple, no gross swelling noted. CHEST: Clear to auscultation. No crackles or wheezes.  Diminished breath sounds bilaterally. CVS: S1 and S2 heard, no murmur. Regular rate and rhythm.  ABDOMEN: Soft, non-tender, bowel sounds are present. EXTREMITIES: No edema.  Left arm on a sling with swelling and tenderness on palpation. CNS: Cranial nerves are intact. No focal motor deficits.  Left arm on a sling. SKIN: warm and dry without rashes.  Data Review: I have personally reviewed the following laboratory data and studies,  CBC: Recent Labs  Lab 03/06/21 1336 03/07/21 0456  WBC 7.5 5.4  NEUTROABS 5.6  --   HGB 13.1 12.3  HCT 39.3 36.5  MCV 97.8 96.3  PLT 201 809   Basic Metabolic Panel: Recent Labs  Lab 03/06/21 1336 03/07/21 0259  NA 138 136  K 3.9 4.4  CL 103 102  CO2 27 26  GLUCOSE 122* 107*  BUN 9 11  CREATININE 0.60 0.53  CALCIUM 8.9 8.6*  MG  --  2.1  PHOS  --  4.0   Liver Function Tests: Recent Labs  Lab 03/06/21 1336  AST 32  ALT 18  ALKPHOS 105  BILITOT 1.5*  PROT 7.5  ALBUMIN 3.6   No results for input(s): LIPASE, AMYLASE in the last 168 hours. Recent Labs  Lab 03/06/21 1336  AMMONIA 25   Cardiac Enzymes: No results for input(s): CKTOTAL, CKMB, CKMBINDEX, TROPONINI in the last 168 hours. BNP (last  3 results) No results for input(s): BNP in the last 8760 hours.  ProBNP (last 3 results) No results for input(s): PROBNP in the last 8760 hours.  CBG: No results for input(s): GLUCAP in the last 168 hours. Recent Results (from the past 240 hour(s))  Resp Panel by RT-PCR (Flu A&B, Covid) Nasopharyngeal Swab     Status: None   Collection Time: 03/06/21 10:37 PM   Specimen: Nasopharyngeal Swab; Nasopharyngeal(NP) swabs in vial transport medium  Result Value Ref Range Status   SARS Coronavirus 2 by RT PCR NEGATIVE NEGATIVE Final    Comment: (NOTE) SARS-CoV-2 target nucleic acids are NOT DETECTED.  The SARS-CoV-2 RNA is generally detectable in upper respiratory specimens during the acute phase of infection. The lowest concentration of SARS-CoV-2 viral copies this assay can detect is 138 copies/mL. A negative result does not preclude SARS-Cov-2 infection and should not be used as the sole basis for treatment or other patient management decisions. A negative result  may occur with  improper specimen collection/handling, submission of specimen other than nasopharyngeal swab, presence of viral mutation(s) within the areas targeted by this assay, and inadequate number of viral copies(<138 copies/mL). A negative result must be combined with clinical observations, patient history, and epidemiological information. The expected result is Negative.  Fact Sheet for Patients:  EntrepreneurPulse.com.au  Fact Sheet for Healthcare Providers:  IncredibleEmployment.be  This test is no t yet approved or cleared by the [place] FDA and  has been authorized for detection and/or diagnosis of SARS-CoV-2 by FDA under an Emergency Use Authorization (EUA). This EUA will remain  in effect (meaning this test can be used) for the duration of the COVID-19 declaration under Section 564(b)(1) of the Act, 21 U.S.C.section 360bbb-3(b)(1), unless the authorization is  terminated  or revoked sooner.       Influenza A by PCR NEGATIVE NEGATIVE Final   Influenza B by PCR NEGATIVE NEGATIVE Final    Comment: (NOTE) The Xpert Xpress SARS-CoV-2/FLU/RSV plus assay is intended as an aid in the diagnosis of influenza from Nasopharyngeal swab specimens and should not be used as a sole basis for treatment. Nasal washings and aspirates are unacceptable for Xpert Xpress SARS-CoV-2/FLU/RSV testing.  Fact Sheet for Patients: EntrepreneurPulse.com.au  Fact Sheet for Healthcare Providers: IncredibleEmployment.be  This test is not yet approved or cleared by the [place] FDA and has been authorized for detection and/or diagnosis of SARS-CoV-2 by FDA under an Emergency Use Authorization (EUA). This EUA will remain in effect (meaning this test can be used) for the duration of the COVID-19 declaration under Section 564(b)(1) of the Act, 21 U.S.C. section 360bbb-3(b)(1), unless the authorization is terminated or revoked.  Performed at [facility], [place] [address] [place], [place] [address]      Studies: CT Head Wo Contrast  Result Date: 03/06/2021 CLINICAL DATA:  Delirium, altered mental status EXAM: CT HEAD WITHOUT CONTRAST TECHNIQUE: Contiguous axial images were obtained from the base of the skull through the vertex without intravenous contrast. COMPARISON:  01/22/2020 FINDINGS: Brain: No evidence of acute infarction, hemorrhage, extra-axial collection, ventriculomegaly, or mass effect. Generalized cerebral atrophy. Periventricular white matter low attenuation likely secondary to microangiopathy. Vascular: Cerebrovascular atherosclerotic calcifications are noted. Skull: Negative for fracture or focal lesion. Sinuses/Orbits: Visualized portions of the orbits are unremarkable. Visualized portions of the paranasal sinuses are unremarkable. Visualized portions of the mastoid air cells are unremarkable. Other: None.  IMPRESSION: 1. No acute intracranial pathology. 2. Chronic microvascular disease and cerebral atrophy. Electronically Signed   By: Kathreen Devoid   On: 03/06/2021 14:33   MR BRAIN WO CONTRAST  Result Date: 03/06/2021 CLINICAL DATA:  Stroke follow-up EXAM: MRI HEAD WITHOUT CONTRAST TECHNIQUE: Multiplanar, multiecho pulse sequences of the brain and surrounding structures were obtained without intravenous contrast. COMPARISON:  Brain MRI 12/12/2019 FINDINGS: Truncated and motion degraded study. Diffusion-weighted imaging, axial T2-weighted imaging and sagittal T1-weighted imaging was performed. No acute ischemia. Generalized volume loss. No midline shift or other mass effect. IMPRESSION: 1. Truncated and motion degraded study. 2. No acute ischemia. Electronically Signed   By: Ulyses Jarred M.D.   On: 03/06/2021 20:58   DG Chest Port 1 View  Result Date: 03/06/2021 CLINICAL DATA:  Weakness. EXAM: PORTABLE CHEST 1 VIEW COMPARISON:  CT 12/12/2019. FINDINGS: Mediastinum and hilar structures normal. Heart size normal. Minimal infiltrates in the left lung base and right upper lung cannot be excluded. No pleural effusion or pneumothorax. Degenerative change thoracic spine. IMPRESSION: Minimal infiltrates in the  left lung base and right upper lung cannot be excluded. Electronically Signed   By: Marcello Moores  Register   On: 03/06/2021 13:47      Flora Lipps, MD  [facility] 03/07/2021  If 7PM-7AM, please contact night-coverage

## 2021-03-07 NOTE — Evaluation (Signed)
Physical Therapy Evaluation Patient Details Name: Kelly Singh MRN: [id number] DOB: Jun 11, 1956 Today's Date: 03/07/2021   History of Present Illness  Pt is a 65 y/o female admitted 6/1 secondary to acute metabolic encephalopathy. Pt with recent presentation to ED for fall that resulted in L humerus fx. PMH includes ataxia, fibromyalgia, and hepatitis C.  Clinical Impression  Pt admitted secondary to problem above with deficits below. Pt with poor safety awareness and easily distracted during mobility tasks. Very unsteady with ataxia noted. Requiring min to mod A for steadying assist during mobility tasks and had LOB X3-4 during short distance gait. Pt currently lives alone and reports her family cannot provide 24/7 support. Feel she would benefit from SNF level therapies at d/c to increase independence and safety with mobility. Will continue to follow acutely.     Follow Up Recommendations SNF;Supervision/Assistance - 24 hour    Equipment Recommendations  Wheelchair (measurements PT);Wheelchair cushion (measurements PT)    Recommendations for Other Services   OT consult    Precautions / Restrictions Precautions Precautions: Fall Precaution Comments: Has had multiple falls. Required Braces or Orthoses: Sling Restrictions Weight Bearing Restrictions: Yes LUE Weight Bearing: Non weight bearing Other Position/Activity Restrictions: Assumed NWB in LUE      Mobility  Bed Mobility Overal bed mobility: Needs Assistance Bed Mobility: Sit to Supine;Supine to Sit     Supine to sit: Min guard Sit to supine: Min guard   General bed mobility comments: Min guard for safety.    Transfers Overall transfer level: Needs assistance Equipment used: 1 person hand held assist Transfers: Sit to/from Stand Sit to Stand: Min assist;Mod assist         General transfer comment: Min to mod A for steadying to stand. Pt using RUE to pull up on PT hand to  stand.  Ambulation/Gait Ambulation/Gait assistance: Min assist;Mod assist Gait Distance (Feet): 40 Feet Assistive device: 1 person hand held assist Gait Pattern/deviations: Step-through pattern;Decreased stride length;Ataxic Gait velocity: Decreased   General Gait Details: Very unsteady gait. LOB X3-4 within short distance. Required min to mod A for steadying. Ataxia noted in BLE, but pt reports this is baseline.  Stairs            Wheelchair Mobility    Modified Rankin (Stroke Patients Only)       Balance Overall balance assessment: Needs assistance Sitting-balance support: No upper extremity supported;Feet supported Sitting balance-Leahy Scale: Fair     Standing balance support: Single extremity supported Standing balance-Leahy Scale: Poor Standing balance comment: Reliant on RUE and external support                             Pertinent Vitals/Pain Pain Assessment: Faces Faces Pain Scale: Hurts little more Pain Location: L shoulder Pain Descriptors / Indicators: Grimacing;Guarding Pain Intervention(s): Monitored during session;Limited activity within patient's tolerance;Repositioned    Home Living Family/patient expects to be discharged to:: Private residence Living Arrangements: Alone Available Help at Discharge: Family;Available PRN/intermittently Type of Home: Apartment Home Access: Level entry     Home Layout: One level Home Equipment: Bedside commode;Other (comment) (walking stick)      Prior Function Level of Independence: Independent with assistive device(s)         Comments: Reports using walking stick for ambulation     Hand Dominance        Extremity/Trunk Assessment   Upper Extremity Assessment Upper Extremity Assessment: Defer to OT evaluation (LUE in sling  throughout)    Lower Extremity Assessment Lower Extremity Assessment: Generalized weakness (ataxia noted in BLE)    Cervical / Trunk Assessment Cervical /  Trunk Assessment: Kyphotic  Communication   Communication: No difficulties  Cognition Arousal/Alertness: Awake/alert Behavior During Therapy: Impulsive Overall Cognitive Status: No family/caregiver present to determine baseline cognitive functioning                                 General Comments: Pt with very poor safety awareness. reporting it was May of 1923. Reports she was here for her humerus fx, but that was last admission. Difficulty attending to task at hand and easily distracted. Pt had sling wrapped around to her back      General Comments General comments (skin integrity, edema, etc.): No family present    Exercises     Assessment/Plan    PT Assessment Patient needs continued PT services  PT Problem List Decreased strength;Decreased balance;Decreased mobility;Decreased cognition;Decreased knowledge of use of DME;Decreased safety awareness;Decreased knowledge of precautions;Decreased coordination       PT Treatment Interventions Gait training;DME instruction;Functional mobility training;Therapeutic exercise;Therapeutic activities;Balance training;Neuromuscular re-education;Patient/family education    PT Goals (Current goals can be found in the Care Plan section)  Acute Rehab PT Goals Patient Stated Goal: to go home PT Goal Formulation: With patient Time For Goal Achievement: 03/21/21 Potential to Achieve Goals: Fair    Frequency Min 2X/week   Barriers to discharge Decreased caregiver support      Co-evaluation               AM-PAC PT "6 Clicks" Mobility  Outcome Measure Help needed turning from your back to your side while in a flat bed without using bedrails?: A Little Help needed moving from lying on your back to sitting on the side of a flat bed without using bedrails?: A Little Help needed moving to and from a bed to a chair (including a wheelchair)?: A Lot Help needed standing up from a chair using your arms (e.g., wheelchair or  bedside chair)?: A Lot Help needed to walk in hospital room?: A Lot Help needed climbing 3-5 steps with a railing? : Total 6 Click Score: 13    End of Session Equipment Utilized During Treatment: Gait belt Activity Tolerance: Patient tolerated treatment well Patient left: in bed;with call bell/phone within reach;with bed alarm set Nurse Communication: Mobility status PT Visit Diagnosis: Unsteadiness on feet (R26.81);Repeated falls (R29.6);Muscle weakness (generalized) (M62.81);History of falling (Z91.81)    Time: 5974-1638 PT Time Calculation (min) (ACUTE ONLY): 25 min   Charges:   PT Evaluation $PT Eval Moderate Complexity: 1 Mod PT Treatments $Gait Training: 8-22 mins        Lou Miner, DPT  Acute Rehabilitation Services  Pager: [phone] Office: [phone]   Kelly Singh 03/07/2021, 12:32 PM

## 2021-03-08 DIAGNOSIS — G9341 Metabolic encephalopathy: Secondary | ICD-10-CM | POA: Diagnosis not present

## 2021-03-08 DIAGNOSIS — R5381 Other malaise: Secondary | ICD-10-CM | POA: Diagnosis not present

## 2021-03-08 DIAGNOSIS — F1910 Other psychoactive substance abuse, uncomplicated: Secondary | ICD-10-CM | POA: Diagnosis not present

## 2021-03-08 DIAGNOSIS — S42212D Unspecified displaced fracture of surgical neck of left humerus, subsequent encounter for fracture with routine healing: Secondary | ICD-10-CM | POA: Diagnosis not present

## 2021-03-08 DIAGNOSIS — B182 Chronic viral hepatitis C: Secondary | ICD-10-CM | POA: Diagnosis not present

## 2021-03-08 NOTE — Progress Notes (Signed)
PROGRESS NOTE  Kelly Singh YWV:[id number] DOB: 1956-06-11 DOA: 03/06/2021 PCP: Janie Morning, DO   LOS: 0 days   Brief narrative:  Kelly Singh is a 65 y.o. female with medical history significant of substance abuse, anxiety, depression, and left humerus fracture after fall on 5/22 presented to the hospital after being noted to be significantly more confused than her baseline over the last several days.   It was reported that the patient had taken any narcotics in the last day.  In the ED patient was afebrile blood pressure is slightly elevated.  Labs were unremarkable.  Bilirubin was slightly elevated.  Alcohol level was undetectable.  CT head scan did not show any acute abnormality.  Left shoulder x-ray showed left humeral surgical neck fracture.  Chest x-ray showed minimal infiltrates.  Urinalysis was negative.  Patient was then admitted hospital for further evaluation and treatment  Assessment/Plan:  Principal Problem:   Acute metabolic encephalopathy Active Problems:   Chronic hepatitis C without hepatic coma (HCC)   Closed fracture of humerus, surgical neck   Polysubstance abuse ([place])  Acute metabolic encephalopathy:  Had been having some confusion for the last few days mostly in the last 1 day.  Ammonia level was normal, CT head scan was negative.  Urinalysis was negative as well.  Chest x-ray showed some infiltrate but no fever or cough or shortness of breath.  TSH was within normal limits.  Procalcitonin was less than 0.10.  Encephalopathy improved with conservative treatment.  History of chronic hepatitis C in the past.  Patient denies current liver problem.  MRI of the brain did not show any acute ischemia but was a motion degraded study.  Currently improved  Left humerus fracture secondary to fall: Prior to arrival.  Patient had sustained a fall on 02/24/2021 with left displaced surgical neck fracture of the humerus and is following at the [facility]. Recommendation  is to continue immobilization on the sling.  Patient does have an appointment to follow-up with orthopedics as outpatient for definitive treatment.  Physical therapy was consulted who recommended skilled nursing facility placement for rehabilitation.  [name] has contacted the orthopedic surgeon who recommended outpatient follow-up and continuation of  sling.  Abnormal chest x-ray: Chest x-ray could not rule out infiltrates in the left lung base and right upper lobe lung.  Procalcitonin was negative.  Patient denies any cough fever chest pain or shortness of breath.  No indication for antibiotics  History of chronic hepatitis C It appears that patient might have had a treatment for hepatitis C in the past as per her history.  Polysubstance abuse / history of alcohol abuse: Initial alcohol level was undetectable.  She states that she drinks especially over the weekends.  Initially CIWA protocol was initiated but due to sedation it was discontinued.  We will consider Ativan if signs of anxiety, restlessness or withdrawal.  So far stable continue thiamine and folic acid.  Debility, deconditioning, recent fall.  Physical therapy was consulted who recommended skilled nursing facility placement for rehabilitation.  Transition of care has been consulted.  History of fibromyalgia depression. Continue duloxetine, gabapentin.  DVT prophylaxis: enoxaparin (LOVENOX) injection 40 mg Start: 03/06/21 1730   Code Status: Full code  Family Communication:  Spoke with the patient's daughter at bedside and updated her about the clinical condition of the patient..  Status is: Observation  The patient will require care spanning > 2 midnights and should be moved to inpatient because: Unsafe d/c plan, IV treatments appropriate  due to intensity of illness or inability to take PO, Inpatient level of care appropriate due to severity of illness and Need for rehabilitation  Dispo: The patient is from:  Home              Anticipated d/c is to: SNF              Patient currently is not medically stable to d/c.   Difficult to place patient No   Consultants:  None  Procedures:  None  Anti-infectives:  . None  Anti-infectives (From admission, onward)   None      Subjective: Today,patient was seen and examined at bedside. Has arm pain mild. No fever, chills or rigor.  Objective: Vitals:   03/08/21 0827 03/08/21 1100  BP: (!) 143/90 (!) 158/94  Pulse: 100 (!) 102  Resp: 18 20  Temp:    SpO2: (!) 20%     Intake/Output Summary (Last 24 hours) at 03/08/2021 1522 Last data filed at 03/08/2021 0500 Gross per 24 hour  Intake 120 ml  Output 1400 ml  Net -1280 ml   Filed Weights   03/06/21 2137  Weight: 58.9 kg   Body mass index is 19.74 kg/m.   Physical Exam:  GENERAL: Patient is alert awake and oriented. Not in obvious distress.   HENT: No scleral pallor or icterus. Pupils equally reactive to light. Oral mucosa is moist NECK: is supple, no gross swelling noted. CHEST: Clear to auscultation. No crackles or wheezes.  Diminished breath sounds bilaterally. CVS: S1 and S2 heard, no murmur. Regular rate and rhythm.  ABDOMEN: Soft, non-tender, bowel sounds are present. EXTREMITIES: No edema.  Left arm on a sling with swelling and tenderness on palpation. CNS: Cranial nerves are intact. No focal motor deficits.  Left arm on a sling. SKIN: warm and dry without rashes.  Data Review: I have personally reviewed the following laboratory data and studies,  CBC: Recent Labs  Lab 03/06/21 1336 03/07/21 0456  WBC 7.5 5.4  NEUTROABS 5.6  --   HGB 13.1 12.3  HCT 39.3 36.5  MCV 97.8 96.3  PLT 201 093   Basic Metabolic Panel: Recent Labs  Lab 03/06/21 1336 03/07/21 0259  NA 138 136  K 3.9 4.4  CL 103 102  CO2 27 26  GLUCOSE 122* 107*  BUN 9 11  CREATININE 0.60 0.53  CALCIUM 8.9 8.6*  MG  --  2.1  PHOS  --  4.0   Liver Function Tests: Recent Labs  Lab  03/06/21 1336  AST 32  ALT 18  ALKPHOS 105  BILITOT 1.5*  PROT 7.5  ALBUMIN 3.6   No results for input(s): LIPASE, AMYLASE in the last 168 hours. Recent Labs  Lab 03/06/21 1336  AMMONIA 25   Cardiac Enzymes: No results for input(s): CKTOTAL, CKMB, CKMBINDEX, TROPONINI in the last 168 hours. BNP (last 3 results) No results for input(s): BNP in the last 8760 hours.  ProBNP (last 3 results) No results for input(s): PROBNP in the last 8760 hours.  CBG: No results for input(s): GLUCAP in the last 168 hours. Recent Results (from the past 240 hour(s))  Resp Panel by RT-PCR (Flu A&B, Covid) Nasopharyngeal Swab     Status: None   Collection Time: 03/06/21 10:37 PM   Specimen: Nasopharyngeal Swab; Nasopharyngeal(NP) swabs in vial transport medium  Result Value Ref Range Status   SARS Coronavirus 2 by RT PCR NEGATIVE NEGATIVE Final    Comment: (NOTE) SARS-CoV-2 target nucleic acids are  NOT DETECTED.  The SARS-CoV-2 RNA is generally detectable in upper respiratory specimens during the acute phase of infection. The lowest concentration of SARS-CoV-2 viral copies this assay can detect is 138 copies/mL. A negative result does not preclude SARS-Cov-2 infection and should not be used as the sole basis for treatment or other patient management decisions. A negative result may occur with  improper specimen collection/handling, submission of specimen other than nasopharyngeal swab, presence of viral mutation(s) within the areas targeted by this assay, and inadequate number of viral copies(<138 copies/mL). A negative result must be combined with clinical observations, patient history, and epidemiological information. The expected result is Negative.  Fact Sheet for Patients:  EntrepreneurPulse.com.au  Fact Sheet for Healthcare Providers:  IncredibleEmployment.be  This test is no t yet approved or cleared by the [place] FDA and  has been  authorized for detection and/or diagnosis of SARS-CoV-2 by FDA under an Emergency Use Authorization (EUA). This EUA will remain  in effect (meaning this test can be used) for the duration of the COVID-19 declaration under Section 564(b)(1) of the Act, 21 U.S.C.section 360bbb-3(b)(1), unless the authorization is terminated  or revoked sooner.       Influenza A by PCR NEGATIVE NEGATIVE Final   Influenza B by PCR NEGATIVE NEGATIVE Final    Comment: (NOTE) The Xpert Xpress SARS-CoV-2/FLU/RSV plus assay is intended as an aid in the diagnosis of influenza from Nasopharyngeal swab specimens and should not be used as a sole basis for treatment. Nasal washings and aspirates are unacceptable for Xpert Xpress SARS-CoV-2/FLU/RSV testing.  Fact Sheet for Patients: EntrepreneurPulse.com.au  Fact Sheet for Healthcare Providers: IncredibleEmployment.be  This test is not yet approved or cleared by the [place] FDA and has been authorized for detection and/or diagnosis of SARS-CoV-2 by FDA under an Emergency Use Authorization (EUA). This EUA will remain in effect (meaning this test can be used) for the duration of the COVID-19 declaration under Section 564(b)(1) of the Act, 21 U.S.C. section 360bbb-3(b)(1), unless the authorization is terminated or revoked.  Performed at [facility], [place] [address] [place], [place] [address]   Culture, blood (single)     Status: None (Preliminary result)   Collection Time: 03/06/21 10:56 PM   Specimen: BLOOD  Result Value Ref Range Status   Specimen Description BLOOD SITE NOT SPECIFIED  Final   Special Requests   Final    BOTTLES DRAWN AEROBIC AND ANAEROBIC Blood Culture adequate volume   Culture   Final    NO GROWTH 1 DAY Performed at [facility], [place] [address] [place], [place] [address]    Report Status PENDING  Incomplete     Studies: MR BRAIN WO CONTRAST  Result Date: 03/06/2021 CLINICAL  DATA:  Stroke follow-up EXAM: MRI HEAD WITHOUT CONTRAST TECHNIQUE: Multiplanar, multiecho pulse sequences of the brain and surrounding structures were obtained without intravenous contrast. COMPARISON:  Brain MRI 12/12/2019 FINDINGS: Truncated and motion degraded study. Diffusion-weighted imaging, axial T2-weighted imaging and sagittal T1-weighted imaging was performed. No acute ischemia. Generalized volume loss. No midline shift or other mass effect. IMPRESSION: 1. Truncated and motion degraded study. 2. No acute ischemia. Electronically Signed   By: Ulyses Jarred M.D.   On: 03/06/2021 20:58      Flora Lipps, MD  [facility] 03/08/2021  If 7PM-7AM, please contact night-coverage

## 2021-03-08 NOTE — Evaluation (Signed)
Occupational Therapy Evaluation Patient Details Name: Kelly Singh MRN: [id number] DOB: [birth year] Today's Date: 03/08/2021    History of Present Illness Pt is a 65 y/o female admitted 6/1 secondary to acute metabolic encephalopathy. Pt with recent presentation to ED for fall that resulted in L humerus fx. PMH includes ataxia, fibromyalgia, and hepatitis C.   Clinical Impression   Patient admitted for the diagnosis above.  PTA she was living alone in her apartment with PRN assist from her daughter.  Barriers are listed below.  Currently, she is needing up to Mod A for ADL completion from a sit/stand level, and hand held [place] A for mobility.  Given insufficient supports at home, and her current level of assist, SNF for post acute rehab is recommended.  OT will follow in the acute setting to maximize her functional status.       Follow Up Recommendations  SNF    Equipment Recommendations  3 in 1 bedside commode;Tub/shower seat    Recommendations for Other Services       Precautions / Restrictions Precautions Precautions: Fall Required Braces or Orthoses: Sling Restrictions Weight Bearing Restrictions: Yes LUE Weight Bearing: Non weight bearing Other Position/Activity Restrictions: NWB with HEP elbow distal with gentle shoulder AROM per patient.  No formal orders.      Mobility Bed Mobility Overal bed mobility: Needs Assistance Bed Mobility: Supine to Sit     Supine to sit: Supervision;HOB elevated          Transfers Overall transfer level: Needs assistance Equipment used: 1 person hand held assist Transfers: Sit to/from [facility] Sit to Stand: Min assist Stand pivot transfers: Min assist            Balance Overall balance assessment: Needs assistance Sitting-balance support: No upper extremity supported;Feet supported Sitting balance-Leahy Scale: Fair     Standing balance support: Single extremity supported Standing balance-Leahy Scale:  Poor Standing balance comment: Reliant on RUE and external support                           ADL either performed or assessed with clinical judgement   ADL Overall ADL's : Needs assistance/impaired Eating/Feeding: Set up;Sitting   Grooming: Wash/dry hands;Wash/dry face;Oral care;Minimal assistance;Sitting   Upper Body Bathing: Minimal assistance;Sitting   Lower Body Bathing: Minimal assistance;Sit to/from stand   Upper Body Dressing : Moderate assistance;Sitting   Lower Body Dressing: Moderate assistance;Sit to/from stand   Toilet Transfer: Minimal assistance;Ambulation   Toileting- Clothing Manipulation and Hygiene: Min guard;Sitting/lateral lean       Functional mobility during ADLs: Minimal assistance       Vision Patient Visual Report: No change from baseline       Perception  WFL   Praxis  Intact    Pertinent Vitals/Pain Faces Pain Scale: Hurts little more Pain Location: L shoulder Pain Descriptors / Indicators: Grimacing;Guarding Pain Intervention(s): Monitored during session     Hand Dominance Right   Extremity/Trunk Assessment Upper Extremity Assessment Upper Extremity Assessment: LUE deficits/detail RUE Sensation: WNL RUE Coordination: WNL LUE Deficits / Details: non operative humeral fx. LUE: Unable to fully assess due to immobilization LUE Sensation: WNL   Lower Extremity Assessment Lower Extremity Assessment: Defer to PT evaluation   Cervical / Trunk Assessment Cervical / Trunk Assessment: Kyphotic   Communication Communication Communication: No difficulties   Cognition Arousal/Alertness: Awake/alert Behavior During Therapy: Impulsive Overall Cognitive Status: No family/caregiver present to determine baseline cognitive functioning  General Comments: Continues with poor safety awareness, and recognition of deficts.  Continues with some disorientation.  Following commands  consistently.   General Comments       Exercises     Shoulder Instructions      Home Living Family/patient expects to be discharged to:: Private residence Living Arrangements: Alone Available Help at Discharge: Family;Available PRN/intermittently Type of Home: Apartment Home Access: Level entry     Home Layout: One level     Bathroom Shower/Tub: Teacher, early years/pre: Standard     Home Equipment: Bedside commode;Other (comment)          Prior Functioning/Environment Level of Independence: Needs assistance  Gait / Transfers Assistance Needed: uses a walking sitck for mobility ADL's / Homemaking Assistance Needed: Daughter has been assisting with community mobility, groceries, heavy home management.  Patient states she has been doing the best she can with ADL completion.            OT Problem List: Decreased range of motion;Impaired balance (sitting and/or standing);Decreased safety awareness;Decreased knowledge of precautions;Impaired UE functional use;Increased edema;Pain      OT Treatment/Interventions: Self-care/ADL training;Therapeutic exercise;DME and/or AE instruction;Balance training;Therapeutic activities    OT Goals(Current goals can be found in the care plan section) Acute Rehab OT Goals Patient Stated Goal: I need some more rehab OT Goal Formulation: With patient Time For Goal Achievement: 03/22/21 Potential to Achieve Goals: Good ADL Goals Pt Will Perform Upper Body Bathing: with min assist;sitting;standing Pt Will Perform Lower Body Bathing: with min guard assist;sit to/from stand Pt Will Perform Upper Body Dressing: with min assist;sitting Pt Will Perform Lower Body Dressing: with min guard assist;sit to/from stand Pt Will Transfer to Toilet: with supervision;ambulating;regular height toilet Pt Will Perform Toileting - Clothing Manipulation and hygiene: with supervision;sit to/from stand  OT Frequency: Min 2X/week   Barriers to D/C:  Decreased caregiver support          Co-evaluation              AM-PAC OT "6 Clicks" Daily Activity     Outcome Measure Help from another person eating meals?: None Help from another person taking care of personal grooming?: A Little Help from another person toileting, which includes using toliet, bedpan, or urinal?: A Little Help from another person bathing (including washing, rinsing, drying)?: A Lot Help from another person to put on and taking off regular upper body clothing?: A Lot Help from another person to put on and taking off regular lower body clothing?: A Lot 6 Click Score: 16   End of Session Nurse Communication: Mobility status  Activity Tolerance: Patient tolerated treatment well Patient left: in chair;with call bell/phone within reach;with chair alarm set  OT Visit Diagnosis: Unsteadiness on feet (R26.81);Pain;History of falling (Z91.81) Pain - Right/Left: Left Pain - part of body: Shoulder                Time: 1000-1034 OT Time Calculation (min): 34 min Charges:  OT General Charges $OT Visit: 1 Visit OT Evaluation $OT Eval Moderate Complexity: 1 Mod OT Treatments $Self Care/Home Management : 8-22 mins  03/08/2021  Rich, OTR/L  Acute Rehabilitation Services  Office:  [place] 03/08/2021, 10:46 AM

## 2021-03-08 NOTE — TOC Initial Note (Signed)
Transition of Care [facility]) - Initial/Assessment Note    Patient Details  Name: Kelly Singh MRN: [id number] Date of Birth: 01-May-1956  Transition of Care [facility]) CM/SW Contact:    Geralynn Ochs, LCSW Phone Number: 03/08/2021, 11:58 AM  Clinical Narrative:     CSW contacted patient's daughter, [name], to discuss recommendation for SNF. Amber agreeable to SNF placement. CSW discussed CMS choice, and family is not aware of facilities in the area. CSW to fax out referral and will follow up with bed offers tomorrow.               Expected Discharge Plan: Skilled Nursing Facility Barriers to Discharge: Continued Medical Work up,Insurance Authorization   Patient Goals and CMS Choice Patient states their goals for this hospitalization and ongoing recovery are:: patient unable to participate in goal setting, not fully oriented at this time. CMS Medicare.gov Compare Post Acute Care list provided to:: Patient Represenative (must comment) Choice offered to / list presented to : Adult Children  Expected Discharge Plan and Services Expected Discharge Plan: [place] Acute Care Choice: [place] Living arrangements for the past 2 months: Single Family Home                                      Prior Living Arrangements/Services Living arrangements for the past 2 months: Single Family Home Lives with:: Self Patient language and need for interpreter reviewed:: No Do you feel safe going back to the place where you live?: Yes      Need for Family Participation in Patient Care: Yes (Comment) Care giver support system in place?: No (comment)   Criminal Activity/Legal Involvement Pertinent to Current Situation/Hospitalization: No - Comment as needed  Activities of Daily Living Home Assistive Devices/Equipment: None ADL Screening (condition at time of admission) Patient's cognitive ability adequate to safely complete daily activities?: No Is  the patient deaf or have difficulty hearing?: No Does the patient have difficulty seeing, even when wearing glasses/contacts?: No Does the patient have difficulty concentrating, remembering, or making decisions?: Yes Patient able to express need for assistance with ADLs?: No Does the patient have difficulty dressing or bathing?: Yes Independently performs ADLs?: No Communication: Independent Dressing (OT): Needs assistance Is this a change from baseline?: Change from baseline, expected to last >3 days Grooming: Needs assistance Is this a change from baseline?: Change from baseline, expected to last >3 days Feeding: Needs assistance Is this a change from baseline?: Change from baseline, expected to last >3 days Bathing: Needs assistance Is this a change from baseline?: Change from baseline, expected to last >3 days Toileting: Needs assistance Is this a change from baseline?: Change from baseline, expected to last >3days In/Out Bed: Needs assistance Is this a change from baseline?: Change from baseline, expected to last >3 days Walks in Home: Dependent Is this a change from baseline?: Change from baseline, expected to last >3 days Does the patient have difficulty walking or climbing stairs?: Yes Weakness of Legs: Left Weakness of Arms/Hands: Left  Permission Sought/Granted Permission sought to share information with : Facility Retail banker granted to share information with : Yes, Verbal Permission Granted  Share Information with NAME: Economist granted to share info w AGENCY: SNF  Permission granted to share info w Relationship: Daughter     Emotional Assessment Appearance:: Appears stated age Attitude/Demeanor/Rapport: Unable to Assess  Affect (typically observed): Unable to Assess Orientation: : Oriented to Self,Oriented to Place   Psych Involvement: No (comment)  Admission diagnosis:  Altered mental status, unspecified altered  mental status type [A67.73] Acute metabolic encephalopathy [P36.68] Patient Active Problem List   Diagnosis Date Noted  . Acute metabolic encephalopathy 15/94/7076  . Closed fracture of humerus, surgical neck 03/06/2021  . Polysubstance abuse ([place]) 03/06/2021  . Cerebellar ataxia in diseases classified elsewhere ([place]) 09/13/2020  . Chronic low back pain 09/13/2020  . Slurred speech 03/08/2020  . Other cirrhosis of liver ([place]) 12/05/2019  . Dizzy 11/09/2019  . Fall 11/09/2019  . Hypomagnesemia 11/09/2019  . Scalp laceration 11/09/2019  . Alcohol intoxication ([place]) 11/09/2019  . Hypokalemia   . Gait abnormality 11/03/2019  . Chronic hepatitis C without hepatic coma ([place]) 11/01/2019   PCP:  Janie Morning, DO Pharmacy:   [facility] [place], [address] AT [address] [place] [address] Phone: [phone] Fax: [phone]  [place] [address] Phone: [phone] Fax: [phone]     Social Determinants of Health (SDOH) Interventions    Readmission Risk Interventions No flowsheet data found.

## 2021-03-08 NOTE — TOC Progression Note (Signed)
Transition of Care [facility]) - Progression Note    Patient Details  Name: Haidyn Kilburg MRN: [id number] Date of Birth: August 02, 1956  Transition of Care [facility]) CM/SW [place], [place] Phone Number: 03/08/2021, 12:00 PM  Clinical Narrative:   CSW met with patient today, as orientation has improved. Patient initially refused SNF, but when CSW explained insurance coverage then patient was agreeable. [facility] said [facility] close [facility] her home, but they do not have any bed availability at this time. CSW provided bed offers to patient, and she will review with her daughter. Per patient, daughter is coming up to the hospital in a little while, and will alert CSW to come and discuss with both of them. CSW to follow.    Expected Discharge Plan: [place] Barriers to Discharge: Continued Medical Work up,Insurance Authorization  Expected Discharge Plan and Services Expected Discharge Plan: [place] Choice: [place] arrangements for the past 2 months: Single Family Home                                       Social Determinants of Health (SDOH) Interventions    Readmission Risk Interventions No flowsheet data found.

## 2021-03-09 DIAGNOSIS — G9341 Metabolic encephalopathy: Secondary | ICD-10-CM | POA: Diagnosis not present

## 2021-03-09 DIAGNOSIS — R5381 Other malaise: Secondary | ICD-10-CM | POA: Diagnosis not present

## 2021-03-09 DIAGNOSIS — S42212D Unspecified displaced fracture of surgical neck of left humerus, subsequent encounter for fracture with routine healing: Secondary | ICD-10-CM | POA: Diagnosis not present

## 2021-03-09 DIAGNOSIS — B182 Chronic viral hepatitis C: Secondary | ICD-10-CM | POA: Diagnosis not present

## 2021-03-09 DIAGNOSIS — F1910 Other psychoactive substance abuse, uncomplicated: Secondary | ICD-10-CM | POA: Diagnosis not present

## 2021-03-09 LAB — BASIC METABOLIC PANEL
Anion gap: 6 (ref 5–15)
BUN: 8 mg/dL (ref 8–23)
CO2: 29 mmol/L (ref 22–32)
Calcium: 8.9 mg/dL (ref 8.9–10.3)
Chloride: 102 mmol/L (ref 98–111)
Creatinine, Ser: 0.58 mg/dL (ref 0.44–1.00)
GFR, Estimated: >60 mL/min (ref >60)
Glucose, Bld: 108 mg/dL — ABNORMAL HIGH (ref 70–99)
Potassium: 4.4 mmol/L (ref 3.5–5.1)
Sodium: 137 mmol/L (ref 135–145)

## 2021-03-09 LAB — CBC
HCT: 38.8 % (ref 36.0–46.0)
Hemoglobin: 13.1 g/dL (ref 12.0–15.0)
MCH: 32.3 pg (ref 26.0–34.0)
MCHC: 33.8 g/dL (ref 30.0–36.0)
MCV: 95.6 fL (ref 80.0–100.0)
Platelets: 159 10*3/uL (ref 150–400)
RBC: 4.06 MIL/uL (ref 3.87–5.11)
RDW: 14.6 % (ref 11.5–15.5)
WBC: 3.6 10*3/uL — ABNORMAL LOW (ref 4.0–10.5)
nRBC: 0 % (ref 0.0–0.2)

## 2021-03-09 LAB — PHOSPHORUS: Phosphorus: 3.7 mg/dL (ref 2.5–4.6)

## 2021-03-09 LAB — MAGNESIUM: Magnesium: 2 mg/dL (ref 1.7–2.4)

## 2021-03-09 NOTE — TOC Progression Note (Signed)
Transition of Care [facility]) - Progression Note    Patient Details  Name: Ariann Khaimov MRN: [id number] Date of Birth: 16-Feb-1956  Transition of Care [facility]) CM/SW [place], [place] Phone Number: 03/09/2021, 11:56 AM  Clinical Narrative:     CSW received call from daughter, [name], who advised CSW that they have chosen [place] for SNF. CSW left a VM for facility confirming the bed. CSW started insurance authorization, will need covid prior to DC. SW will continue to follow for DC needs.  Expected Discharge Plan: [place] Barriers to Discharge: Continued Medical Work up,Insurance Authorization  Expected Discharge Plan and Services Expected Discharge Plan: [place] Choice: [place] arrangements for the past 2 months: Single Family Home                                       Social Determinants of Health (SDOH) Interventions    Readmission Risk Interventions No flowsheet data found.

## 2021-03-09 NOTE — Plan of Care (Signed)

## 2021-03-09 NOTE — Progress Notes (Signed)
PROGRESS NOTE  Kelly Singh UJW:[id number] DOB: [birth year] DOA: 03/06/2021 PCP: Janie Morning, DO   LOS: 0 days   Brief narrative:  Kelly Singh is a 65 y.o. female with medical history significant of substance abuse, anxiety, depression, and left humerus fracture after fall on 5/22 presented to the hospital after being noted to be significantly more confused than her baseline over the last several days.   It was reported that the patient had taken any narcotics in the last day.  In the ED patient was afebrile blood pressure is slightly elevated.  Labs were unremarkable.  Bilirubin was slightly elevated.  Alcohol level was undetectable.  CT head scan did not show any acute abnormality.  Left shoulder x-ray showed left humeral surgical neck fracture.  Chest x-ray showed minimal infiltrates.  Urinalysis was negative.  Patient was then admitted hospital for further evaluation and treatment.  Assessment/Plan:  Principal Problem:   Acute metabolic encephalopathy Active Problems:   Chronic hepatitis C without hepatic coma (HCC)   Closed fracture of humerus, surgical neck   Polysubstance abuse (HCC)  Acute metabolic encephalopathy:  Likely due to  Polypharmacy.  Ammonia level was normal, CT head scan was negative.  Urinalysis was negative as well.  Chest x-ray showed some infiltrate but no fever or cough or shortness of breath.  TSH was within normal limits.  Procalcitonin was less than 0.10.  Metabolic encephalopathy improved with conservative treatment.  History of chronic hepatitis C in the past.  Patient denies current liver problem.  MRI of the brain did not show any acute ischemia but was a motion degraded study.  Currently improved  Left humerus fracture secondary to fall: Prior to arrival.  Patient had sustained a fall on 02/24/2021 with left displaced surgical neck fracture of the humerus and is following at the [facility]. Recommendation is to continue immobilization on the  sling.  Patient does have an appointment to follow-up with orthopedics as outpatient for definitive treatment.  Physical therapy was consulted who recommended skilled nursing facility placement for rehabilitation.  [name] has contacted the orthopedic surgeon who recommended outpatient follow-up and continuation of  sling.  Abnormal chest x-ray: Chest x-ray could not rule out infiltrates in the left lung base and right upper lobe lung.  Procalcitonin was negative.  No symptoms.  No indication for antibiotic.  History of chronic hepatitis C It appears that patient might have had a treatment for hepatitis C in the past as per her history.  Polysubstance abuse / history of alcohol abuse:  Initial alcohol level was undetectable.  She states that she drinks especially over the weekends.  Initially CIWA protocol was initiated but due to sedation it was discontinued.  We will consider Ativan if signs of anxiety, restlessness or withdrawal.  So far stable.  continue thiamine and folic acid.  Debility, deconditioning, recent fall.  Physical therapy was consulted who recommended skilled nursing facility placement for rehabilitation.    History of fibromyalgia depression. Continue duloxetine, gabapentin.  DVT prophylaxis: enoxaparin (LOVENOX) injection 40 mg Start: 03/06/21 1730   Code Status: Full code  Family Communication:  I spoke with the patient's daughter yesterday on the phone and updated her about the clinical condition of the patient..  Status is: Observation  The patient will require care spanning > 2 midnights and should be moved to inpatient because: Unsafe d/c plan, IV treatments appropriate due to intensity of illness or inability to take PO, Inpatient level of care appropriate due to severity of  illness and Need for rehabilitation  Dispo: The patient is from: Home              Anticipated d/c is to: SNF              Patient currently is not medically stable to d/c.    Difficult to place patient No   Consultants:  None  Procedures:  None  Anti-infectives:  . None  Anti-infectives (From admission, onward)   None      Subjective: Today patient was seen and examined at bedside.  Patient complains of mild arm pain.  Denies any fogginess dizziness headache lightheadedness or confusion.   Objective: Vitals:   03/09/21 0430 03/09/21 0736  BP: (!) 147/79 (!) 145/98  Pulse:  92  Resp:  20  Temp: 98.2 F (36.8 C) 98.4 F (36.9 C)  SpO2:  94%    Intake/Output Summary (Last 24 hours) at 03/09/2021 0931 Last data filed at 03/09/2021 0737 Gross per 24 hour  Intake 240 ml  Output 600 ml  Net -360 ml   Filed Weights   03/06/21 2137  Weight: 58.9 kg   Body mass index is 19.74 kg/m.   Physical Exam:  General:  Average built, not in obvious distress HENT:   No scleral pallor or icterus noted. Oral mucosa is moist.  Chest:  Clear breath sounds.  Diminished breath sounds bilaterally. No crackles or wheezes.  CVS: S1 &S2 heard. No murmur.  Regular rate and rhythm. Abdomen: Soft, nontender, nondistended.  Bowel sounds are heard.   Extremities: No cyanosis, clubbing or edema.  Peripheral pulses are palpable.  Left arm on a sling with some edema and tenderness. Psych: Alert, awake and oriented, normal mood CNS:  No cranial nerve deficits.  .  Left arm on a sling. Skin: Warm and dry.  No rashes noted.   Data Review: I have personally reviewed the following laboratory data and studies,  CBC: Recent Labs  Lab 03/06/21 1336 03/07/21 0456 03/09/21 0331  WBC 7.5 5.4 3.6*  NEUTROABS 5.6  --   --   HGB 13.1 12.3 13.1  HCT 39.3 36.5 38.8  MCV 97.8 96.3 95.6  PLT 201 164 063   Basic Metabolic Panel: Recent Labs  Lab 03/06/21 1336 03/07/21 0259 03/09/21 0331  NA 138 136 137  K 3.9 4.4 4.4  CL 103 102 102  CO2 27 26 29   GLUCOSE 122* 107* 108*  BUN 9 11 8   CREATININE 0.60 0.53 0.58  CALCIUM 8.9 8.6* 8.9  MG  --  2.1 2.0  PHOS  --   4.0 3.7   Liver Function Tests: Recent Labs  Lab 03/06/21 1336  AST 32  ALT 18  ALKPHOS 105  BILITOT 1.5*  PROT 7.5  ALBUMIN 3.6   No results for input(s): LIPASE, AMYLASE in the last 168 hours. Recent Labs  Lab 03/06/21 1336  AMMONIA 25   Cardiac Enzymes: No results for input(s): CKTOTAL, CKMB, CKMBINDEX, TROPONINI in the last 168 hours. BNP (last 3 results) No results for input(s): BNP in the last 8760 hours.  ProBNP (last 3 results) No results for input(s): PROBNP in the last 8760 hours.  CBG: No results for input(s): GLUCAP in the last 168 hours. Recent Results (from the past 240 hour(s))  Resp Panel by RT-PCR (Flu A&B, Covid) Nasopharyngeal Swab     Status: None   Collection Time: 03/06/21 10:37 PM   Specimen: Nasopharyngeal Swab; Nasopharyngeal(NP) swabs in vial transport medium  Result Value  Ref Range Status   SARS Coronavirus 2 by RT PCR NEGATIVE NEGATIVE Final    Comment: (NOTE) SARS-CoV-2 target nucleic acids are NOT DETECTED.  The SARS-CoV-2 RNA is generally detectable in upper respiratory specimens during the acute phase of infection. The lowest concentration of SARS-CoV-2 viral copies this assay can detect is 138 copies/mL. A negative result does not preclude SARS-Cov-2 infection and should not be used as the sole basis for treatment or other patient management decisions. A negative result may occur with  improper specimen collection/handling, submission of specimen other than nasopharyngeal swab, presence of viral mutation(s) within the areas targeted by this assay, and inadequate number of viral copies(<138 copies/mL). A negative result must be combined with clinical observations, patient history, and epidemiological information. The expected result is Negative.  Fact Sheet for Patients:  EntrepreneurPulse.com.au  Fact Sheet for Healthcare Providers:  IncredibleEmployment.be  This test is no t yet approved or  cleared by the [place] FDA and  has been authorized for detection and/or diagnosis of SARS-CoV-2 by FDA under an Emergency Use Authorization (EUA). This EUA will remain  in effect (meaning this test can be used) for the duration of the COVID-19 declaration under Section 564(b)(1) of the Act, 21 U.S.C.section 360bbb-3(b)(1), unless the authorization is terminated  or revoked sooner.       Influenza A by PCR NEGATIVE NEGATIVE Final   Influenza B by PCR NEGATIVE NEGATIVE Final    Comment: (NOTE) The Xpert Xpress SARS-CoV-2/FLU/RSV plus assay is intended as an aid in the diagnosis of influenza from Nasopharyngeal swab specimens and should not be used as a sole basis for treatment. Nasal washings and aspirates are unacceptable for Xpert Xpress SARS-CoV-2/FLU/RSV testing.  Fact Sheet for Patients: EntrepreneurPulse.com.au  Fact Sheet for Healthcare Providers: IncredibleEmployment.be  This test is not yet approved or cleared by the [place] FDA and has been authorized for detection and/or diagnosis of SARS-CoV-2 by FDA under an Emergency Use Authorization (EUA). This EUA will remain in effect (meaning this test can be used) for the duration of the COVID-19 declaration under Section 564(b)(1) of the Act, 21 U.S.C. section 360bbb-3(b)(1), unless the authorization is terminated or revoked.  Performed at [facility], [place] [address], [place], [place] [address]   Culture, blood (single)     Status: None (Preliminary result)   Collection Time: 03/06/21 10:56 PM   Specimen: BLOOD  Result Value Ref Range Status   Specimen Description BLOOD SITE NOT SPECIFIED  Final   Special Requests   Final    BOTTLES DRAWN AEROBIC AND ANAEROBIC Blood Culture adequate volume   Culture   Final    NO GROWTH 1 DAY Performed at [facility], [place] [address] [place], [place] [address]    Report Status PENDING  Incomplete     Studies: No results  found.    Flora Lipps, MD  [facility] 03/09/2021  If 7PM-7AM, please contact night-coverage

## 2021-03-10 DIAGNOSIS — B182 Chronic viral hepatitis C: Secondary | ICD-10-CM | POA: Diagnosis not present

## 2021-03-10 DIAGNOSIS — G9341 Metabolic encephalopathy: Secondary | ICD-10-CM | POA: Diagnosis not present

## 2021-03-10 DIAGNOSIS — R5381 Other malaise: Secondary | ICD-10-CM | POA: Diagnosis not present

## 2021-03-10 DIAGNOSIS — F1910 Other psychoactive substance abuse, uncomplicated: Secondary | ICD-10-CM | POA: Diagnosis not present

## 2021-03-10 DIAGNOSIS — S42212D Unspecified displaced fracture of surgical neck of left humerus, subsequent encounter for fracture with routine healing: Secondary | ICD-10-CM | POA: Diagnosis not present

## 2021-03-10 NOTE — Progress Notes (Signed)
PROGRESS NOTE  Kelly Singh EGB:[id number] DOB: 1955-10-22 DOA: 03/06/2021 PCP: Janie Morning, DO   LOS: 0 days   Brief narrative:  Kelly Singh is a 65 y.o. female with medical history significant of substance abuse, anxiety, depression, and left humerus fracture after fall on 5/22 presented to the hospital after being noted to be significantly more confused than her baseline over the last several days.   It was reported that the patient had taken any narcotics in the last day.  In the ED patient was afebrile blood pressure is slightly elevated.  Labs were unremarkable.  Bilirubin was slightly elevated.  Alcohol level was undetectable.  CT head scan did not show any acute abnormality.  Left shoulder x-ray showed left humeral surgical neck fracture.  Chest x-ray showed minimal infiltrates.  Urinalysis was negative.  Patient was then admitted hospital for further evaluation and treatment.  Assessment/Plan:  Principal Problem:   Acute metabolic encephalopathy Active Problems:   Chronic hepatitis C without hepatic coma (HCC)   Closed fracture of humerus, surgical neck   Polysubstance abuse (HCC)  Acute metabolic encephalopathy:  Likely due to  Polypharmacy.  Ammonia level was normal, CT head scan was negative.  Urinalysis was negative as well.  Chest x-ray showed some infiltrate but no fever or cough or shortness of breath.  TSH was within normal limits.  Procalcitonin was less than 0.10.  Metabolic encephalopathy improved with conservative treatment.  History of chronic hepatitis C in the past.  Patient denies current liver problem.  MRI of the brain did not show any acute ischemia but was a motion degraded study.  Improved at this time.  Left humerus fracture secondary to fall: Prior to arrival.  Patient had sustained a fall on 02/24/2021 with left displaced surgical neck fracture of the humerus and is following at the [facility]. Recommendation is to continue immobilization on  the sling.  Patient does have an appointment to follow-up with orthopedics as outpatient for definitive treatment.  Physical therapy was consulted who recommended skilled nursing facility placement for rehabilitation.  [name] has contacted the orthopedic surgeon who recommended outpatient follow-up and continuation of  sling.  We will add warm compression.  Abnormal chest x-ray: Chest x-ray could not rule out infiltrates in the left lung base and right upper lobe lung.  Procalcitonin was negative.  No symptoms.  No indication for antibiotic at this time.Marland Kitchen  History of chronic hepatitis C It appears that patient might have had a treatment for hepatitis C in the past as per her history.  Polysubstance abuse / history of alcohol abuse:  Initial alcohol level was undetectable.  She states that she drinks especially over the weekends.  Initially CIWA protocol was initiated but due to sedation it was discontinued.  We will consider Ativan if signs of anxiety, restlessness or withdrawal.  Insert withdrawal at this time.  Continue thiamine and folic acid.  Debility, deconditioning, recent fall.  Physical therapy was consulted who recommended skilled nursing facility placement for rehabilitation.    History of fibromyalgia depression. Continue duloxetine, gabapentin.  DVT prophylaxis: enoxaparin (LOVENOX) injection 40 mg Start: 03/06/21 1730   Code Status: Full code  Family Communication:  I spoke with the patient's daughter on 6/ 3.    Status is: Observation  The patient will require care spanning > 2 midnights and should be moved to inpatient because: Unsafe d/c plan, IV treatments appropriate due to intensity of illness or inability to take PO, Inpatient level of care appropriate  due to severity of illness and Need for rehabilitation  Dispo: The patient is from: Home              Anticipated d/c is to: SNF              Patient currently is not medically stable to d/c.   Difficult  to place patient No   Consultants:  None  Procedures:  None  Anti-infectives:  . None  Anti-infectives (From admission, onward)   None      Subjective: Today, patient was seen and examined at bedside.  Still complains of mild pain.  Denies any dizziness lightheadedness shortness of breath fever chills.   Objective: Vitals:   03/10/21 0341 03/10/21 0742  BP: (!) 153/90 138/87  Pulse: 79 72  Resp: 19 16  Temp: 98.6 F (37 C)   SpO2: 96% 95%    Intake/Output Summary (Last 24 hours) at 03/10/2021 1404 Last data filed at 03/10/2021 0341 Gross per 24 hour  Intake 120 ml  Output 1075 ml  Net -955 ml   Filed Weights   03/06/21 2137  Weight: 58.9 kg   Body mass index is 19.74 kg/m.   Physical Exam:  General:  Average built, not in obvious distress HENT:   No scleral pallor or icterus noted. Oral mucosa is moist.  Chest:  Clear breath sounds.  Diminished breath sounds bilaterally. No crackles or wheezes.  CVS: S1 &S2 heard. No murmur.  Regular rate and rhythm. Abdomen: Soft, nontender, nondistended.  Bowel sounds are heard.   Extremities: No cyanosis, clubbing or edema.  Peripheral pulses are palpable.  Left arm on a sling with some edema and tenderness.  Tenderness on Palpation. Psych: Alert, awake and oriented, normal mood CNS:  No cranial nerve deficits.  .  Left arm on a sling. Skin: Warm and dry.  No rashes noted.   Data Review: I have personally reviewed the following laboratory data and studies,  CBC: Recent Labs  Lab 03/06/21 1336 03/07/21 0456 03/09/21 0331  WBC 7.5 5.4 3.6*  NEUTROABS 5.6  --   --   HGB 13.1 12.3 13.1  HCT 39.3 36.5 38.8  MCV 97.8 96.3 95.6  PLT 201 164 202   Basic Metabolic Panel: Recent Labs  Lab 03/06/21 1336 03/07/21 0259 03/09/21 0331  NA 138 136 137  K 3.9 4.4 4.4  CL 103 102 102  CO2 27 26 29   GLUCOSE 122* 107* 108*  BUN 9 11 8   CREATININE 0.60 0.53 0.58  CALCIUM 8.9 8.6* 8.9  MG  --  2.1 2.0  PHOS  --   4.0 3.7   Liver Function Tests: Recent Labs  Lab 03/06/21 1336  AST 32  ALT 18  ALKPHOS 105  BILITOT 1.5*  PROT 7.5  ALBUMIN 3.6   No results for input(s): LIPASE, AMYLASE in the last 168 hours. Recent Labs  Lab 03/06/21 1336  AMMONIA 25   Cardiac Enzymes: No results for input(s): CKTOTAL, CKMB, CKMBINDEX, TROPONINI in the last 168 hours. BNP (last 3 results) No results for input(s): BNP in the last 8760 hours.  ProBNP (last 3 results) No results for input(s): PROBNP in the last 8760 hours.  CBG: No results for input(s): GLUCAP in the last 168 hours. Recent Results (from the past 240 hour(s))  Resp Panel by RT-PCR (Flu A&B, Covid) Nasopharyngeal Swab     Status: None   Collection Time: 03/06/21 10:37 PM   Specimen: Nasopharyngeal Swab; Nasopharyngeal(NP) swabs in vial transport  medium  Result Value Ref Range Status   SARS Coronavirus 2 by RT PCR NEGATIVE NEGATIVE Final    Comment: (NOTE) SARS-CoV-2 target nucleic acids are NOT DETECTED.  The SARS-CoV-2 RNA is generally detectable in upper respiratory specimens during the acute phase of infection. The lowest concentration of SARS-CoV-2 viral copies this assay can detect is 138 copies/mL. A negative result does not preclude SARS-Cov-2 infection and should not be used as the sole basis for treatment or other patient management decisions. A negative result may occur with  improper specimen collection/handling, submission of specimen other than nasopharyngeal swab, presence of viral mutation(s) within the areas targeted by this assay, and inadequate number of viral copies(<138 copies/mL). A negative result must be combined with clinical observations, patient history, and epidemiological information. The expected result is Negative.  Fact Sheet for Patients:  EntrepreneurPulse.com.au  Fact Sheet for Healthcare Providers:  IncredibleEmployment.be  This test is no t yet approved or  cleared by the [place] FDA and  has been authorized for detection and/or diagnosis of SARS-CoV-2 by FDA under an Emergency Use Authorization (EUA). This EUA will remain  in effect (meaning this test can be used) for the duration of the COVID-19 declaration under Section 564(b)(1) of the Act, 21 U.S.C.section 360bbb-3(b)(1), unless the authorization is terminated  or revoked sooner.       Influenza A by PCR NEGATIVE NEGATIVE Final   Influenza B by PCR NEGATIVE NEGATIVE Final    Comment: (NOTE) The Xpert Xpress SARS-CoV-2/FLU/RSV plus assay is intended as an aid in the diagnosis of influenza from Nasopharyngeal swab specimens and should not be used as a sole basis for treatment. Nasal washings and aspirates are unacceptable for Xpert Xpress SARS-CoV-2/FLU/RSV testing.  Fact Sheet for Patients: EntrepreneurPulse.com.au  Fact Sheet for Healthcare Providers: IncredibleEmployment.be  This test is not yet approved or cleared by the [place] FDA and has been authorized for detection and/or diagnosis of SARS-CoV-2 by FDA under an Emergency Use Authorization (EUA). This EUA will remain in effect (meaning this test can be used) for the duration of the COVID-19 declaration under Section 564(b)(1) of the Act, 21 U.S.C. section 360bbb-3(b)(1), unless the authorization is terminated or revoked.  Performed at [facility], [place] [address], [place], [place] [address]   Culture, blood (single)     Status: None (Preliminary result)   Collection Time: 03/06/21 10:56 PM   Specimen: BLOOD  Result Value Ref Range Status   Specimen Description BLOOD SITE NOT SPECIFIED  Final   Special Requests   Final    BOTTLES DRAWN AEROBIC AND ANAEROBIC Blood Culture adequate volume   Culture   Final    NO GROWTH 2 DAYS Performed at [facility], [address] [place], [place] [address]    Report Status PENDING  Incomplete     Studies: No  results found.    Flora Lipps, MD  [facility] 03/10/2021  If 7PM-7AM, please contact night-coverage

## 2021-03-10 NOTE — TOC Progression Note (Signed)
Transition of Care [facility]) - Progression Note    Patient Details  Name: Kelly Singh MRN: [id number] Date of Birth: [birth year]  Transition of Care [facility]) CM/SW [place], [place] Phone Number: 03/10/2021, 2:43 PM  Clinical Narrative:    CSW was notified that pt has been approved for SNF at [place].  Plan Auth ID: [id number] Navi ID [id number] Approved 6/5 - 6/8 CM: Paroul Naan Fax clinicals to [phone]   Expected Discharge Plan: [place] Barriers to Discharge: Continued Medical Work up,Insurance Authorization  Expected Discharge Plan and Services Expected Discharge Plan: [place] Choice: [place] arrangements for the past 2 months: Single Family Home                                       Social Determinants of Health (SDOH) Interventions    Readmission Risk Interventions No flowsheet data found.

## 2021-03-11 DIAGNOSIS — R270 Ataxia, unspecified: Secondary | ICD-10-CM | POA: Diagnosis not present

## 2021-03-11 DIAGNOSIS — G9341 Metabolic encephalopathy: Secondary | ICD-10-CM | POA: Diagnosis not present

## 2021-03-11 DIAGNOSIS — R918 Other nonspecific abnormal finding of lung field: Secondary | ICD-10-CM | POA: Diagnosis not present

## 2021-03-11 DIAGNOSIS — R202 Paresthesia of skin: Secondary | ICD-10-CM | POA: Diagnosis not present

## 2021-03-11 DIAGNOSIS — R5381 Other malaise: Secondary | ICD-10-CM | POA: Diagnosis not present

## 2021-03-11 DIAGNOSIS — Z79899 Other long term (current) drug therapy: Secondary | ICD-10-CM | POA: Diagnosis not present

## 2021-03-11 DIAGNOSIS — G1111 Friedreich ataxia: Secondary | ICD-10-CM | POA: Diagnosis not present

## 2021-03-11 DIAGNOSIS — E559 Vitamin D deficiency, unspecified: Secondary | ICD-10-CM | POA: Diagnosis not present

## 2021-03-11 DIAGNOSIS — S43212A Anterior subluxation of left sternoclavicular joint, initial encounter: Secondary | ICD-10-CM | POA: Diagnosis not present

## 2021-03-11 DIAGNOSIS — Z87891 Personal history of nicotine dependence: Secondary | ICD-10-CM | POA: Diagnosis not present

## 2021-03-11 DIAGNOSIS — G3281 Cerebellar ataxia in diseases classified elsewhere: Secondary | ICD-10-CM | POA: Diagnosis not present

## 2021-03-11 DIAGNOSIS — M797 Fibromyalgia: Secondary | ICD-10-CM | POA: Diagnosis not present

## 2021-03-11 DIAGNOSIS — S42212D Unspecified displaced fracture of surgical neck of left humerus, subsequent encounter for fracture with routine healing: Secondary | ICD-10-CM | POA: Diagnosis not present

## 2021-03-11 DIAGNOSIS — F32A Depression, unspecified: Secondary | ICD-10-CM | POA: Diagnosis not present

## 2021-03-11 DIAGNOSIS — S42292A Other displaced fracture of upper end of left humerus, initial encounter for closed fracture: Secondary | ICD-10-CM | POA: Diagnosis not present

## 2021-03-11 DIAGNOSIS — M069 Rheumatoid arthritis, unspecified: Secondary | ICD-10-CM | POA: Diagnosis not present

## 2021-03-11 DIAGNOSIS — Z20822 Contact with and (suspected) exposure to covid-19: Secondary | ICD-10-CM | POA: Diagnosis not present

## 2021-03-11 DIAGNOSIS — R531 Weakness: Secondary | ICD-10-CM | POA: Diagnosis not present

## 2021-03-11 DIAGNOSIS — Z91048 Other nonmedicinal substance allergy status: Secondary | ICD-10-CM | POA: Diagnosis not present

## 2021-03-11 DIAGNOSIS — B182 Chronic viral hepatitis C: Secondary | ICD-10-CM | POA: Diagnosis not present

## 2021-03-11 DIAGNOSIS — R7989 Other specified abnormal findings of blood chemistry: Secondary | ICD-10-CM | POA: Diagnosis not present

## 2021-03-11 DIAGNOSIS — M25512 Pain in left shoulder: Secondary | ICD-10-CM | POA: Diagnosis not present

## 2021-03-11 DIAGNOSIS — E538 Deficiency of other specified B group vitamins: Secondary | ICD-10-CM | POA: Diagnosis not present

## 2021-03-11 DIAGNOSIS — F1910 Other psychoactive substance abuse, uncomplicated: Secondary | ICD-10-CM | POA: Diagnosis not present

## 2021-03-11 DIAGNOSIS — R799 Abnormal finding of blood chemistry, unspecified: Secondary | ICD-10-CM | POA: Diagnosis not present

## 2021-03-11 DIAGNOSIS — R269 Unspecified abnormalities of gait and mobility: Secondary | ICD-10-CM | POA: Diagnosis not present

## 2021-03-11 DIAGNOSIS — S42212A Unspecified displaced fracture of surgical neck of left humerus, initial encounter for closed fracture: Secondary | ICD-10-CM | POA: Diagnosis not present

## 2021-03-11 DIAGNOSIS — Z91040 Latex allergy status: Secondary | ICD-10-CM | POA: Diagnosis not present

## 2021-03-11 DIAGNOSIS — S42202A Unspecified fracture of upper end of left humerus, initial encounter for closed fracture: Secondary | ICD-10-CM | POA: Diagnosis not present

## 2021-03-11 DIAGNOSIS — R4781 Slurred speech: Secondary | ICD-10-CM | POA: Diagnosis not present

## 2021-03-11 DIAGNOSIS — G8918 Other acute postprocedural pain: Secondary | ICD-10-CM | POA: Diagnosis not present

## 2021-03-11 LAB — RESP PANEL BY RT-PCR (FLU A&B, COVID) ARPGX2
Influenza A by PCR: NEGATIVE
Influenza B by PCR: NEGATIVE
SARS Coronavirus 2 by RT PCR: NEGATIVE

## 2021-03-11 MED ORDER — ACETAMINOPHEN 325 MG PO TABS: 650.0000 mg | ORAL_TABLET | Freq: Four times a day (QID) | ORAL | Status: AC | PRN

## 2021-03-11 MED ORDER — HYDROCODONE-ACETAMINOPHEN 5-325 MG PO TABS: 1.0000 | ORAL_TABLET | Freq: Four times a day (QID) | ORAL | 0 refills | Status: AC | PRN

## 2021-03-11 MED ORDER — ADULT MULTIVITAMIN W/MINERALS CH: 1.0000 | ORAL_TABLET | Freq: Every day | ORAL | 11 refills | Status: AC

## 2021-03-11 MED ORDER — FOLIC ACID 1 MG PO TABS: 1.0000 mg | ORAL_TABLET | Freq: Every day | ORAL | Status: AC

## 2021-03-11 MED ORDER — THIAMINE HCL 100 MG PO TABS: 100.0000 mg | ORAL_TABLET | Freq: Every day | ORAL | 2 refills | Status: AC

## 2021-03-11 NOTE — TOC Transition Note (Signed)
Transition of Care [facility]) - CM/SW Discharge Note   Patient Details  Name: Kelly Singh MRN: [id number] Date of Birth: 07-04-1956  Transition of Care [facility]) CM/SW Contact:  Geralynn Ochs, LCSW Phone Number: 03/11/2021, 1:04 PM   Clinical Narrative:   Nurse to call report to [phone].    Final next level of care: Skilled Nursing Facility Barriers to Discharge: Barriers Resolved   Patient Goals and CMS Choice Patient states their goals for this hospitalization and ongoing recovery are:: patient unable to participate in goal setting, not fully oriented at this time. CMS Medicare.gov Compare Post Acute Care list provided to:: Patient Represenative (must comment) Choice offered to / list presented to : Adult Children  Discharge Placement              Patient chooses bed at:  [facility]) Patient to be transferred to facility by: [place] Name of family member notified: [name] Patient and family notified of of transfer: 03/11/21  Discharge Plan and Services     Post Acute Care Choice: [place]                               Social Determinants of Health (SDOH) Interventions     Readmission Risk Interventions No flowsheet data found.

## 2021-03-11 NOTE — Progress Notes (Signed)
RN attempted to call report to [facility], left message, will attempt again later.

## 2021-03-11 NOTE — Plan of Care (Signed)
  Problem: Education: Goal: Knowledge of General Education information will improve Description: Including pain rating scale, medication(s)/side effects and non-pharmacologic comfort measures Outcome: Adequate for Discharge   Problem: Health Behavior/Discharge Planning: Goal: Ability to manage health-related needs will improve Outcome: Adequate for Discharge   Problem: Clinical Measurements: Goal: Ability to maintain clinical measurements within normal limits will improve Outcome: Adequate for Discharge Goal: Will remain free from infection Outcome: Adequate for Discharge Goal: Diagnostic test results will improve Outcome: Adequate for Discharge Goal: Respiratory complications will improve Outcome: Adequate for Discharge Goal: Cardiovascular complication will be avoided Outcome: Adequate for Discharge   Problem: Activity: Goal: Risk for activity intolerance will decrease Outcome: Adequate for Discharge   Problem: Nutrition: Goal: Adequate nutrition will be maintained Outcome: Adequate for Discharge   Problem: Coping: Goal: Level of anxiety will decrease Outcome: Adequate for Discharge   Problem: Elimination: Goal: Will not experience complications related to bowel motility Outcome: Adequate for Discharge Goal: Will not experience complications related to urinary retention Outcome: Adequate for Discharge   Problem: Pain Managment: Goal: General experience of comfort will improve Outcome: Adequate for Discharge   Problem: Safety: Goal: Ability to remain free from injury will improve Outcome: Adequate for Discharge   Problem: Skin Integrity: Goal: Risk for impaired skin integrity will decrease Outcome: Adequate for Discharge   Problem: Acute Rehab PT Goals(only PT should resolve) Goal: Pt Will Go Supine/Side To Sit Outcome: Adequate for Discharge Goal: Pt Will Go Sit To Supine/Side Outcome: Adequate for Discharge Goal: Patient Will Transfer Sit To/From  Stand Outcome: Adequate for Discharge Goal: Pt Will Ambulate Outcome: Adequate for Discharge   Problem: Acute Rehab OT Goals (only OT should resolve) Goal: Pt. Will Perform Upper Body Bathing Outcome: Adequate for Discharge Goal: Pt. Will Perform Lower Body Bathing Outcome: Adequate for Discharge Goal: Pt. Will Perform Upper Body Dressing Outcome: Adequate for Discharge Goal: Pt. Will Perform Lower Body Dressing Outcome: Adequate for Discharge Goal: Pt. Will Transfer To Toilet Outcome: Adequate for Discharge Goal: Pt. Will Perform Toileting-Clothing Manipulation Outcome: Adequate for Discharge

## 2021-03-11 NOTE — Progress Notes (Signed)
Physical Therapy Treatment Patient Details Name: Kelly Singh MRN: [id number] DOB: [birth year] Today's Date: 03/11/2021    History of Present Illness Pt is a 65 y/o female admitted 6/1 secondary to acute metabolic encephalopathy. Pt with recent presentation to ED for fall that resulted in L humerus fx. PMH includes ataxia, fibromyalgia, and hepatitis C.    PT Comments    Pt received in bed, eager to participate in therapy. She required supervision bed mobility, min assist transfers, and min to mod assist ambulation HHA 75'. L sling in place during session. She presents with decreased balance and ataxic gait. Pt in recliner at end of session.    Follow Up Recommendations  SNF;Supervision/Assistance - 24 hour     Equipment Recommendations  Wheelchair (measurements PT);Wheelchair cushion (measurements PT)    Recommendations for Other Services       Precautions / Restrictions Precautions Precautions: Fall Precaution Comments: Has had multiple falls. Required Braces or Orthoses: Sling Restrictions LUE Weight Bearing: Non weight bearing Other Position/Activity Restrictions: NWB with HEP elbow distal with gentle shoulder AROM per patient.  No formal orders.    Mobility  Bed Mobility Overal bed mobility: Needs Assistance Bed Mobility: Supine to Sit     Supine to sit: Supervision;HOB elevated     General bed mobility comments: supervision for safety, increased time    Transfers Overall transfer level: Needs assistance Equipment used: 1 person hand held assist Transfers: Sit to/from Stand Sit to Stand: Min assist         General transfer comment: assist to power up and stabilize balance  Ambulation/Gait Ambulation/Gait assistance: Min assist;Mod assist Gait Distance (Feet): 75 Feet Assistive device: 1 person hand held assist Gait Pattern/deviations: Step-through pattern;Ataxic;Decreased stride length Gait velocity: decreased Gait velocity interpretation: 1.31 -  2.62 ft/sec, indicative of limited community ambulator General Gait Details: Unsteady gait. Min assist progressing to episodes of mod assist to stabilize balance   Stairs             Wheelchair Mobility    Modified Rankin (Stroke Patients Only)       Balance Overall balance assessment: Needs assistance Sitting-balance support: No upper extremity supported;Feet supported Sitting balance-Leahy Scale: Good     Standing balance support: Single extremity supported;During functional activity Standing balance-Leahy Scale: Poor Standing balance comment: reliant on external support                            Cognition Arousal/Alertness: Awake/alert Behavior During Therapy: Impulsive Overall Cognitive Status: No family/caregiver present to determine baseline cognitive functioning                                        Exercises      General Comments General comments (skin integrity, edema, etc.): VSS      Pertinent Vitals/Pain Pain Assessment: Faces Faces Pain Scale: Hurts little more Pain Location: L shoulder Pain Descriptors / Indicators: Sore Pain Intervention(s): Monitored during session;Repositioned    Home Living                      Prior Function            PT Goals (current goals can now be found in the care plan section) Acute Rehab PT Goals Patient Stated Goal: rehab then home Progress towards PT goals: Progressing toward goals  Frequency    Min 2X/week      PT Plan Current plan remains appropriate    Co-evaluation              AM-PAC PT "6 Clicks" Mobility   Outcome Measure  Help needed turning from your back to your side while in a flat bed without using bedrails?: A Little Help needed moving from lying on your back to sitting on the side of a flat bed without using bedrails?: A Little Help needed moving to and from a bed to a chair (including a wheelchair)?: A Lot Help needed standing up  from a chair using your arms (e.g., wheelchair or bedside chair)?: A Little Help needed to walk in hospital room?: A Lot Help needed climbing 3-5 steps with a railing? : A Lot 6 Click Score: 15    End of Session Equipment Utilized During Treatment: Gait belt;Other (comment) (L sling) Activity Tolerance: Patient tolerated treatment well Patient left: in chair;with call bell/phone within reach;with chair alarm set Nurse Communication: Mobility status PT Visit Diagnosis: Unsteadiness on feet (R26.81);Repeated falls (R29.6);Muscle weakness (generalized) (M62.81);History of falling (Z91.81)     Time: 8590-9311 PT Time Calculation (min) (ACUTE ONLY): 15 min  Charges:  $Gait Training: 8-22 mins                     Lorrin Goodell, PT  Office # [phone] Pager [phone]    Kelly Singh 03/11/2021, 9:59 AM

## 2021-03-11 NOTE — Progress Notes (Signed)
Pt medically stable for discharge per MD order. Report called to [facility] RN at [facility], all questions answered. IV and tele removed. Belongings: Purse, cell phone, charger, glasses, clothing, shoes. Transport by [employer] to [facility].

## 2021-03-11 NOTE — Discharge Summary (Signed)
Physician Discharge Summary  Kelly Singh INO:[id number] DOB: 06/09/1956 DOA: 03/06/2021  PCP: Janie Morning, DO  Admit date: 03/06/2021 Discharge date: 03/11/2021  Admitted From: Home  Discharge disposition: Skilled nursing facility   Recommendations for Outpatient Follow-Up:   . Follow up with your primary care provider at the skilled nursing facility in 3 to 5 days. . Check CBC, BMP, magnesium in the next visit . Follow-up with orthopedics at [place] for definite management of her left humerus fracture.. . Continue wearing sling until seen by orthopedic doctor   Discharge Diagnosis:   Principal Problem:   Acute metabolic encephalopathy Active Problems:   Chronic hepatitis C without hepatic coma (HCC)   Closed fracture of humerus, surgical neck   Polysubstance abuse ([place])   Discharge Condition: Improved.  Diet recommendation: Regular.  Wound care: None.  Code status: Full.   History of Present Illness:   Kelly Singh a 65 y.o.femalewith medical history significant ofsubstance use disorder,, anxiety, depression, and left humerus fracture after fall on 5/22 presented to the hospital after being noted to be significantly more confused than her baseline over the last several days.  It was reported that the patient had taken  narcotics in the last day.  In the ED patient was afebrile blood pressure is slightly elevated.  Labs were unremarkable.  Bilirubin was slightly elevated.  Alcohol level was undetectable.  CT head scan did not show any acute abnormality.  Left shoulder x-ray showed left humeral surgical neck fracture.  Chest x-ray showed minimal infiltrates.  Urinalysis was negative.  Patient was then admitted hospital for further evaluation and treatment   Hospital Course:   Following conditions were addressed during hospitalization as listed below,  Acute metabolicencephalopathy:  Likely due to  Polypharmacy.  Ammonia level was normal, CT head  scan was negative.  Urinalysis was negative as well.  Chest x-ray showed some infiltrate but no fever or cough or shortness of breath.  TSH was within normal limits.  Procalcitonin was less than 0.10.  Metabolic encephalopathy improved with conservative treatment. MRI of the brain did not show any acute ischemia but was a motion degraded study.  Improved at this time.  Left humerus fracturesecondary to fall: Prior to arrival.  Patient had sustained a fall on 02/24/2021 with left displaced surgical neck fracture of the humerus and follows up at the [facility] [facility].Recommendation is to continue immobilization on the sling.    Physical therapy was consulted who recommended skilled nursing facility placement for rehabilitation.  [name] has contacted the orthopedic surgeon who recommended outpatient follow-up and continuation of  sling.    Abnormal chest x-ray:  No symptoms.  Could not rule out infiltrates.  Procalcitonin was negative.  No antibiotics were given.  History of chronic hepatitis C It appears that patient might have had a treatment for hepatitis C in the past as per her history.  Polysubstanceabuse /history of alcohol abuse:  Initial alcohol level was undetectable.  She states that she drinks especially over the weekends.  Initially CIWA protocol was initiated but due to sedation it was discontinued.    She did not have any further withdrawal symptoms.  Continue thiamine and folic acid on discharge including multivitamin.  Debility, deconditioning, recent fall.  Physical therapy was consulted who recommended skilled nursing facility placement for rehabilitation.    History of fibromyalgia depression. Continue duloxetine, gabapentin.   Disposition.  At this time, patient is stable for disposition to skilled nursing facility with outpatient PCP and orthopedic  follow-up  Medical Consultants:    None.  Procedures:    None Subjective:   Today, patient was seen  and examined at bedside.  Patient denies any nausea vomiting dizziness lightheadedness has mild arm discomfort.  Discharge Exam:   Vitals:   03/11/21 0422 03/11/21 0723  BP: (!) 144/86 130/75  Pulse: 86 79  Resp: 19 18  Temp: 98 F (36.7 C) 98 F (36.7 C)  SpO2: 97% 99%   Vitals:   03/10/21 2018 03/11/21 0100 03/11/21 0422 03/11/21 0723  BP: (!) 159/89 (!) 143/72 (!) 144/86 130/75  Pulse: 79 80 86 79  Resp: 20 17 19 18   Temp: 97.6 F (36.4 C) 98 F (36.7 C) 98 F (36.7 C) 98 F (36.7 C)  TempSrc: Oral Oral Oral Oral  SpO2: 100% 98% 97% 99%  Weight:      Height:       General: Alert awake, not in obvious distress HENT: pupils equally reacting to light,  No scleral pallor or icterus noted. Oral mucosa is moist.  Chest:  Clear breath sounds.  Diminished breath sounds bilaterally. No crackles or wheezes.  CVS: S1 &S2 heard. No murmur.  Regular rate and rhythm. Abdomen: Soft, nontender, nondistended.  Bowel sounds are heard.   Extremities: No cyanosis, clubbing or edema.  Peripheral pulses are palpable.  Left arm on a sling.  Mild edema noted Psych: Alert, awake and oriented, normal mood CNS:  No cranial nerve deficits.  Power equal in all extremities.   Skin: Warm and dry.  No rashes noted.  The results of significant diagnostics from this hospitalization (including imaging, microbiology, ancillary and laboratory) are listed below for reference.     Diagnostic Studies:   CT Head Wo Contrast  Result Date: 03/06/2021 CLINICAL DATA:  Delirium, altered mental status EXAM: CT HEAD WITHOUT CONTRAST TECHNIQUE: Contiguous axial images were obtained from the base of the skull through the vertex without intravenous contrast. COMPARISON:  01/22/2020 FINDINGS: Brain: No evidence of acute infarction, hemorrhage, extra-axial collection, ventriculomegaly, or mass effect. Generalized cerebral atrophy. Periventricular white matter low attenuation likely secondary to microangiopathy. Vascular:  Cerebrovascular atherosclerotic calcifications are noted. Skull: Negative for fracture or focal lesion. Sinuses/Orbits: Visualized portions of the orbits are unremarkable. Visualized portions of the paranasal sinuses are unremarkable. Visualized portions of the mastoid air cells are unremarkable. Other: None. IMPRESSION: 1. No acute intracranial pathology. 2. Chronic microvascular disease and cerebral atrophy. Electronically Signed   By: Kathreen Devoid   On: 03/06/2021 14:33   MR BRAIN WO CONTRAST  Result Date: 03/06/2021 CLINICAL DATA:  Stroke follow-up EXAM: MRI HEAD WITHOUT CONTRAST TECHNIQUE: Multiplanar, multiecho pulse sequences of the brain and surrounding structures were obtained without intravenous contrast. COMPARISON:  Brain MRI 12/12/2019 FINDINGS: Truncated and motion degraded study. Diffusion-weighted imaging, axial T2-weighted imaging and sagittal T1-weighted imaging was performed. No acute ischemia. Generalized volume loss. No midline shift or other mass effect. IMPRESSION: 1. Truncated and motion degraded study. 2. No acute ischemia. Electronically Signed   By: Ulyses Jarred M.D.   On: 03/06/2021 20:58   DG Chest Port 1 View  Result Date: 03/06/2021 CLINICAL DATA:  Weakness. EXAM: PORTABLE CHEST 1 VIEW COMPARISON:  CT 12/12/2019. FINDINGS: Mediastinum and hilar structures normal. Heart size normal. Minimal infiltrates in the left lung base and right upper lung cannot be excluded. No pleural effusion or pneumothorax. Degenerative change thoracic spine. IMPRESSION: Minimal infiltrates in the left lung base and right upper lung cannot be excluded. Electronically Signed  By: [place]   On: 03/06/2021 13:47     Labs:   Basic Metabolic Panel: Recent Labs  Lab 03/06/21 1336 03/07/21 0259 03/09/21 0331  NA 138 136 137  K 3.9 4.4 4.4  CL 103 102 102  CO2 27 26 29   GLUCOSE 122* 107* 108*  BUN 9 11 8   CREATININE 0.60 0.53 0.58  CALCIUM 8.9 8.6* 8.9  MG  --  2.1 2.0  PHOS  --   4.0 3.7   GFR Estimated Creatinine Clearance: 65.2 mL/min (by C-G formula based on SCr of 0.58 mg/dL). Liver Function Tests: Recent Labs  Lab 03/06/21 1336  AST 32  ALT 18  ALKPHOS 105  BILITOT 1.5*  PROT 7.5  ALBUMIN 3.6   No results for input(s): LIPASE, AMYLASE in the last 168 hours. Recent Labs  Lab 03/06/21 1336  AMMONIA 25   Coagulation profile Recent Labs  Lab 03/06/21 1336  INR 1.2    CBC: Recent Labs  Lab 03/06/21 1336 03/07/21 0456 03/09/21 0331  WBC 7.5 5.4 3.6*  NEUTROABS 5.6  --   --   HGB 13.1 12.3 13.1  HCT 39.3 36.5 38.8  MCV 97.8 96.3 95.6  PLT 201 164 159   Cardiac Enzymes: No results for input(s): CKTOTAL, CKMB, CKMBINDEX, TROPONINI in the last 168 hours. BNP: Invalid input(s): POCBNP CBG: No results for input(s): GLUCAP in the last 168 hours. D-Dimer No results for input(s): DDIMER in the last 72 hours. Hgb A1c No results for input(s): HGBA1C in the last 72 hours. Lipid Profile No results for input(s): CHOL, HDL, LDLCALC, TRIG, CHOLHDL, LDLDIRECT in the last 72 hours. Thyroid function studies No results for input(s): TSH, T4TOTAL, T3FREE, THYROIDAB in the last 72 hours.  Invalid input(s): FREET3 Anemia work up No results for input(s): VITAMINB12, FOLATE, FERRITIN, TIBC, IRON, RETICCTPCT in the last 72 hours. Microbiology Recent Results (from the past 240 hour(s))  Resp Panel by RT-PCR (Flu A&B, Covid) Nasopharyngeal Swab     Status: None   Collection Time: 03/06/21 10:37 PM   Specimen: Nasopharyngeal Swab; Nasopharyngeal(NP) swabs in vial transport medium  Result Value Ref Range Status   SARS Coronavirus 2 by RT PCR NEGATIVE NEGATIVE Final    Comment: (NOTE) SARS-CoV-2 target nucleic acids are NOT DETECTED.  The SARS-CoV-2 RNA is generally detectable in upper respiratory specimens during the acute phase of infection. The lowest concentration of SARS-CoV-2 viral copies this assay can detect is 138 copies/mL. A negative result  does not preclude SARS-Cov-2 infection and should not be used as the sole basis for treatment or other patient management decisions. A negative result may occur with  improper specimen collection/handling, submission of specimen other than nasopharyngeal swab, presence of viral mutation(s) within the areas targeted by this assay, and inadequate number of viral copies(<138 copies/mL). A negative result must be combined with clinical observations, patient history, and epidemiological information. The expected result is Negative.  Fact Sheet for Patients:  EntrepreneurPulse.com.au  Fact Sheet for Healthcare Providers:  IncredibleEmployment.be  This test is no t yet approved or cleared by the [place] FDA and  has been authorized for detection and/or diagnosis of SARS-CoV-2 by FDA under an Emergency Use Authorization (EUA). This EUA will remain  in effect (meaning this test can be used) for the duration of the COVID-19 declaration under Section 564(b)(1) of the Act, 21 U.S.C.section 360bbb-3(b)(1), unless the authorization is terminated  or revoked sooner.       Influenza A by PCR NEGATIVE  NEGATIVE Final   Influenza B by PCR NEGATIVE NEGATIVE Final    Comment: (NOTE) The Xpert Xpress SARS-CoV-2/FLU/RSV plus assay is intended as an aid in the diagnosis of influenza from Nasopharyngeal swab specimens and should not be used as a sole basis for treatment. Nasal washings and aspirates are unacceptable for Xpert Xpress SARS-CoV-2/FLU/RSV testing.  Fact Sheet for Patients: EntrepreneurPulse.com.au  Fact Sheet for Healthcare Providers: IncredibleEmployment.be  This test is not yet approved or cleared by the [place] FDA and has been authorized for detection and/or diagnosis of SARS-CoV-2 by FDA under an Emergency Use Authorization (EUA). This EUA will remain in effect (meaning this test can be used) for  the duration of the COVID-19 declaration under Section 564(b)(1) of the Act, 21 U.S.C. section 360bbb-3(b)(1), unless the authorization is terminated or revoked.  Performed at [facility], [place] [address] [place], [place] [address]   Culture, blood (single)     Status: None (Preliminary result)   Collection Time: 03/06/21 10:56 PM   Specimen: BLOOD  Result Value Ref Range Status   Specimen Description BLOOD SITE NOT SPECIFIED  Final   Special Requests   Final    BOTTLES DRAWN AEROBIC AND ANAEROBIC Blood Culture adequate volume   Culture   Final    NO GROWTH 4 DAYS Performed at [facility], [place] [address], [place], [place] [address]    Report Status PENDING  Incomplete  Resp Panel by RT-PCR (Flu A&B, Covid) Nasopharyngeal Swab     Status: None   Collection Time: 03/11/21  8:31 AM   Specimen: Nasopharyngeal Swab; Nasopharyngeal(NP) swabs in vial transport medium  Result Value Ref Range Status   SARS Coronavirus 2 by RT PCR NEGATIVE NEGATIVE Final    Comment: (NOTE) SARS-CoV-2 target nucleic acids are NOT DETECTED.  The SARS-CoV-2 RNA is generally detectable in upper respiratory specimens during the acute phase of infection. The lowest concentration of SARS-CoV-2 viral copies this assay can detect is 138 copies/mL. A negative result does not preclude SARS-Cov-2 infection and should not be used as the sole basis for treatment or other patient management decisions. A negative result may occur with  improper specimen collection/handling, submission of specimen other than nasopharyngeal swab, presence of viral mutation(s) within the areas targeted by this assay, and inadequate number of viral copies(<138 copies/mL). A negative result must be combined with clinical observations, patient history, and epidemiological information. The expected result is Negative.  Fact Sheet for Patients:  EntrepreneurPulse.com.au  Fact Sheet for Healthcare Providers:   IncredibleEmployment.be  This test is no t yet approved or cleared by the [place] FDA and  has been authorized for detection and/or diagnosis of SARS-CoV-2 by FDA under an Emergency Use Authorization (EUA). This EUA will remain  in effect (meaning this test can be used) for the duration of the COVID-19 declaration under Section 564(b)(1) of the Act, 21 U.S.C.section 360bbb-3(b)(1), unless the authorization is terminated  or revoked sooner.       Influenza A by PCR NEGATIVE NEGATIVE Final   Influenza B by PCR NEGATIVE NEGATIVE Final    Comment: (NOTE) The Xpert Xpress SARS-CoV-2/FLU/RSV plus assay is intended as an aid in the diagnosis of influenza from Nasopharyngeal swab specimens and should not be used as a sole basis for treatment. Nasal washings and aspirates are unacceptable for Xpert Xpress SARS-CoV-2/FLU/RSV testing.  Fact Sheet for Patients: EntrepreneurPulse.com.au  Fact Sheet for Healthcare Providers: IncredibleEmployment.be  This test is not yet approved or cleared by  the [employer] and has been authorized for detection and/or diagnosis of SARS-CoV-2 by FDA under an Emergency Use Authorization (EUA). This EUA will remain in effect (meaning this test can be used) for the duration of the COVID-19 declaration under Section 564(b)(1) of the Act, 21 U.S.C. section 360bbb-3(b)(1), unless the authorization is terminated or revoked.  Performed at [facility], [place] [address] [place],  [address]      Discharge Instructions:   Discharge Instructions    Diet general   Complete by: As directed    Discharge instructions   Complete by: As directed    Follow-up with your primary care provider at the skilled nursing facility in 3 to 5 days.  Follow-up with [facility] orth[facility]ics as scheduled by you.  Continue to use sling for your arm.  Continue physical therapy.   Increase activity slowly    Complete by: As directed      Allergies as of 03/11/2021      Reactions   Latex Itching, Swelling, Rash, Other (See Comments)   Eats through skin; blister   Adhesive [tape] Other (See Comments)   Plastic tape eats through skin - pls use paper tape      Medication List    TAKE these medications   acetaminophen 325 MG tablet Commonly known as: TYLENOL Take 2 tablets (650 mg total) by mouth every 6 (six) hours as needed for mild pain or headache.   baclofen 20 MG tablet Commonly known as: LIORESAL Take 20 mg by mouth as needed for muscle spasms.   DULoxetine 60 MG capsule Commonly known as: Cymbalta Take 1 capsule (60 mg total) by mouth daily.   FISH OIL PO Take 1 capsule by mouth daily.   folic acid 1 MG tablet Commonly known as: FOLVITE Take 1 tablet (1 mg total) by mouth daily.   gabapentin 100 MG capsule Commonly known as: Neurontin Take 1 capsule (100 mg total) by mouth 3 (three) times daily.   HYDROcodone-acetaminophen 5-325 MG tablet Commonly known as: NORCO/VICODIN Take 1 tablet by mouth every 6 (six) hours as needed (pain).   multivitamin with minerals Tabs tablet Take 1 tablet by mouth daily.   ondansetron 4 MG disintegrating tablet Commonly known as: Zofran ODT Take 1 tablet (4 mg total) by mouth every 8 (eight) hours as needed for nausea or vomiting.   thiamine 100 MG tablet Take 1 tablet (100 mg total) by mouth daily.   Vitamin D (Cholecalciferol) 25 MCG (1000 UT) Tabs Take 1 tablet by mouth daily.       Contact information for after-discharge care    Destination    [facility] .   Service: Skilled Nursing Contact information: [address] [place] [phone]                   Time coordinating discharge: 39 minutes  Signed:  Kaysie Michelini  [facility] 03/11/2021, 9:55 AM

## 2021-03-12 LAB — CULTURE, BLOOD (SINGLE)
Culture: NO GROWTH
Special Requests: ADEQUATE

## 2021-03-13 DIAGNOSIS — G9341 Metabolic encephalopathy: Secondary | ICD-10-CM | POA: Diagnosis not present

## 2021-03-13 DIAGNOSIS — M797 Fibromyalgia: Secondary | ICD-10-CM | POA: Diagnosis not present

## 2021-03-14 DIAGNOSIS — F32A Depression, unspecified: Secondary | ICD-10-CM | POA: Diagnosis not present

## 2021-03-14 DIAGNOSIS — G9341 Metabolic encephalopathy: Secondary | ICD-10-CM | POA: Diagnosis not present

## 2021-03-14 DIAGNOSIS — M797 Fibromyalgia: Secondary | ICD-10-CM | POA: Diagnosis not present

## 2021-03-14 DIAGNOSIS — S42212D Unspecified displaced fracture of surgical neck of left humerus, subsequent encounter for fracture with routine healing: Secondary | ICD-10-CM | POA: Diagnosis not present

## 2021-03-18 ENCOUNTER — Ambulatory Visit: Payer: Medicare Other

## 2021-03-19 ENCOUNTER — Ambulatory Visit: Payer: Medicare HMO | Admitting: Neurology

## 2021-03-19 DIAGNOSIS — M25512 Pain in left shoulder: Secondary | ICD-10-CM | POA: Diagnosis not present

## 2021-03-19 DIAGNOSIS — S42202A Unspecified fracture of upper end of left humerus, initial encounter for closed fracture: Secondary | ICD-10-CM | POA: Diagnosis not present

## 2021-03-20 DIAGNOSIS — M797 Fibromyalgia: Secondary | ICD-10-CM | POA: Diagnosis not present

## 2021-03-20 DIAGNOSIS — F32A Depression, unspecified: Secondary | ICD-10-CM | POA: Diagnosis not present

## 2021-03-20 DIAGNOSIS — S42212D Unspecified displaced fracture of surgical neck of left humerus, subsequent encounter for fracture with routine healing: Secondary | ICD-10-CM | POA: Diagnosis not present

## 2021-03-20 DIAGNOSIS — G9341 Metabolic encephalopathy: Secondary | ICD-10-CM | POA: Diagnosis not present

## 2021-03-27 ENCOUNTER — Other Ambulatory Visit: Payer: Self-pay | Admitting: *Deleted

## 2021-03-27 ENCOUNTER — Ambulatory Visit: Payer: Medicare Other | Admitting: Neurology

## 2021-03-27 ENCOUNTER — Ambulatory Visit (INDEPENDENT_AMBULATORY_CARE_PROVIDER_SITE_OTHER): Payer: Medicare Other | Admitting: Neurology

## 2021-03-27 DIAGNOSIS — R531 Weakness: Secondary | ICD-10-CM

## 2021-03-27 DIAGNOSIS — R270 Ataxia, unspecified: Secondary | ICD-10-CM | POA: Diagnosis not present

## 2021-03-27 DIAGNOSIS — R269 Unspecified abnormalities of gait and mobility: Secondary | ICD-10-CM

## 2021-03-27 DIAGNOSIS — G3281 Cerebellar ataxia in diseases classified elsewhere: Secondary | ICD-10-CM

## 2021-03-27 DIAGNOSIS — G9341 Metabolic encephalopathy: Secondary | ICD-10-CM | POA: Diagnosis not present

## 2021-03-27 DIAGNOSIS — Z79899 Other long term (current) drug therapy: Secondary | ICD-10-CM | POA: Diagnosis not present

## 2021-03-27 DIAGNOSIS — R202 Paresthesia of skin: Secondary | ICD-10-CM

## 2021-03-27 DIAGNOSIS — E538 Deficiency of other specified B group vitamins: Secondary | ICD-10-CM

## 2021-03-27 DIAGNOSIS — E559 Vitamin D deficiency, unspecified: Secondary | ICD-10-CM

## 2021-03-27 DIAGNOSIS — R4781 Slurred speech: Secondary | ICD-10-CM

## 2021-03-27 DIAGNOSIS — R7989 Other specified abnormal findings of blood chemistry: Secondary | ICD-10-CM

## 2021-03-27 DIAGNOSIS — R799 Abnormal finding of blood chemistry, unspecified: Secondary | ICD-10-CM

## 2021-03-27 DIAGNOSIS — R42 Dizziness and giddiness: Secondary | ICD-10-CM

## 2021-03-27 DIAGNOSIS — M797 Fibromyalgia: Secondary | ICD-10-CM | POA: Diagnosis not present

## 2021-03-27 DIAGNOSIS — B182 Chronic viral hepatitis C: Secondary | ICD-10-CM

## 2021-03-27 DIAGNOSIS — M545 Low back pain, unspecified: Secondary | ICD-10-CM

## 2021-03-27 DIAGNOSIS — E876 Hypokalemia: Secondary | ICD-10-CM

## 2021-03-27 DIAGNOSIS — S42212D Unspecified displaced fracture of surgical neck of left humerus, subsequent encounter for fracture with routine healing: Secondary | ICD-10-CM | POA: Diagnosis not present

## 2021-03-27 DIAGNOSIS — G8929 Other chronic pain: Secondary | ICD-10-CM

## 2021-03-27 DIAGNOSIS — K7469 Other cirrhosis of liver: Secondary | ICD-10-CM

## 2021-03-27 MED ORDER — DULOXETINE HCL 60 MG PO CPEP: 60.0000 mg | ORAL_CAPSULE | Freq: Every day | ORAL | 4 refills | Status: DC

## 2021-03-27 MED ORDER — GABAPENTIN 100 MG PO CAPS: 100.0000 mg | ORAL_CAPSULE | Freq: Three times a day (TID) | ORAL | 4 refills | Status: DC

## 2021-03-27 NOTE — Procedures (Signed)
Full Name: Kelly Singh Gender: Female MRN #: [id number] Date of Birth: 10-19-1955    Visit Date: 03/27/2021 07:16 Age: 65 Years Examining Physician: Marcial Pacas, MD  Referring Physician: Marcial Pacas, MD History: 65 year old female, presenting with more than 10 years history of slowly progressive gait abnormality, dysarthria, dysphagia, also complains of worsening low back pain, bladder incontinence.  Summary of the test:  Conduction study:  Bilateral sural sensory responses were within normal limit.  Right superficial peroneal sensory response was absent; left superficial peroneal sensory response showed significantly decreased snap amplitude.  Electromyography: Selected needle examinations were performed at bilateral lower extremity muscles, bilateral lumbosacral paraspinal muscles; right upper extremity muscles and right cervical paraspinal muscles; right mid and lower thoracic paraspinals; right frontalis, right sternocleidomastoid, right orbicularis oculi.  There was evidence of widespread chronic neuropathic changes involving bilateral lumbar sacral myotomes, and right cervical myotomes.  There was no evidence of spontaneous activity, muscle fasciculations.  There was also subtle chronic neuropathic changes noted at right sternocleidomastoid, right orbicularis oculi.  There was no significant abnormality noted at right frontalis muscle.  Conclusion: This is an abnormal study.  There is evidence of chronic bilateral lumbosacral radiculopathy, mainly involving bilateral L4-5 more than S1 myotomes.  This is consistent with her MRI lumbar spine findings.  In addition, there is evidence of subtle, more widespread chronic neuropathic changes involving right cervical myotomes, right sternocleidomastoid.  Differentiation diagnoses are wide, this include central nervous system degenerative disorder, nutritional deficiency, infection, inflammatory process.  More extensive laboratory  evaluation was ordered.    ------------------------------- Marcial Pacas M.D. PhD  [facility] [address] [place], [place] [address] Tel: [phone] Fax: [phone]  Verbal informed consent was obtained from the patient, patient was informed of potential risk of procedure, including bruising, bleeding, hematoma formation, infection, muscle weakness, muscle pain, numbness, among others.        [place]    Nerve / Sites Muscle Latency Ref. Amplitude Ref. Rel Amp Segments Distance Velocity Ref. Area    ms ms mV mV %  cm m/s m/s mVms  R Peroneal - EDB     Ankle EDB 6.3 ?6.5 0.5 ?2.0 100 Ankle - EDB 9   2.1     Fib head EDB 15.0  0.5  106 Fib head - Ankle 30 35 ?44 1.9     Pop fossa EDB 17.9  0.4  74.7 Pop fossa - Fib head 10 34 ?44 1.7         Pop fossa - Ankle      L Peroneal - EDB     Ankle EDB 6.4 ?6.5 0.8 ?2.0 100 Ankle - EDB 9   3.8     Fib head EDB 14.3  0.6  77.5 Fib head - Ankle 30 38 ?44 2.8     Pop fossa EDB 17.1  0.7  101 Pop fossa - Fib head 10 36 ?44 2.7         Pop fossa - Ankle      R Tibial - AH     Ankle AH 4.1 ?5.8 7.0 ?4.0 100 Ankle - AH 9   17.4     Pop fossa AH 13.6  4.9  70.1 Pop fossa - Ankle 39 41 ?41 18.8  L Tibial - AH     Ankle AH 3.4 ?5.8 7.8 ?4.0 100 Ankle - AH 9   23.8     Pop fossa AH 13.0  4.7  59.3 Pop fossa - Ankle 39 41 ?41 19.2             SNC    Nerve / Sites Rec. Site Peak Lat Ref.  Amp Ref. Segments Distance    ms ms V V  cm  R Sural - Ankle (Calf)     Calf Ankle 3.8 ?4.4 7 ?6 Calf - Ankle 14  L Sural - Ankle (Calf)     Calf Ankle 3.5 ?4.4 7 ?6 Calf - Ankle 14  R Superficial peroneal - Ankle     Lat leg Ankle NR ?4.4 NR ?6 Lat leg - Ankle 10  L Superficial peroneal - Ankle     Lat leg Ankle 3.3 ?4.4 2 ?6 Lat leg - Ankle 10             F  Wave    Nerve F Lat Ref.   ms ms  R Tibial - AH 60.6 ?56.0  L Tibial - AH 60.5 ?56.0         EMG Summary Table    Spontaneous MUAP Recruitment  Muscle IA Fib PSW  Fasc Other Amp Dur. Poly Pattern  R. Tibialis anterior Normal None None None _______ Normal Normal Normal Normal  R. Tibialis posterior Normal None None None _______ Normal Normal Normal Normal  R. Peroneus longus Normal None None None _______ Normal Normal Normal Normal  R. Gastrocnemius (Medial head) Normal None None None _______ Normal Normal Normal Normal  R. Vastus lateralis Normal None None None _______ Normal Normal Normal Normal  L. Tibialis anterior Increased None None None _______ Normal Normal Normal Reduced  L. Tibialis posterior Increased None None None _______ Normal Normal Normal Reduced  L. Peroneus longus Normal None None None _______ Normal Normal Normal Reduced  L. Gastrocnemius (Medial head) Normal None None None _______ Normal Normal Normal Reduced  L. Vastus lateralis Normal None None None _______ Increased Increased Normal Reduced  R. Lumbar paraspinals (low) Normal None None None _______ Normal Normal Normal Normal  R. Lumbar paraspinals (mid) Normal None None None _______ Normal Normal Normal Normal  L. Lumbar paraspinals (low) Normal None None None _______ Normal Normal Normal Normal  L. Lumbar paraspinals (mid) Normal None None None _______ Normal Normal Normal Normal  R. First dorsal interosseous Normal None None None _______ Normal Normal Normal Reduced  R. Brachioradialis Normal None None None _______ Normal Normal Normal Reduced  R. Extensor digitorum communis Normal None None None _______ Normal Normal Normal Reduced  R. Biceps brachii Normal None None None _______ Normal Normal Normal Normal  R. Deltoid Increased None None None _______ Normal Normal Normal Reduced  R. Cervical paraspinals Normal None None None _______ Normal Normal Normal Normal  R. Thoracic paraspinals (low) Normal None None None _______ Normal Normal Normal Normal  R. Thoracic paraspinals (mid) Normal None None None _______ Normal Normal Normal Normal  R. Sternocleidomastoid Increased None  None None _______ Normal Normal Normal Reduced  R. Frontalis Normal None None None _______ Normal Normal Normal Normal  R. Orbicularis oculi Normal None None None _______ Normal Normal Normal Reduced

## 2021-03-27 NOTE — Progress Notes (Signed)
EMG report is under procedure 

## 2021-03-27 NOTE — Progress Notes (Signed)
ASSESSMENT AND PLAN 65 y.o. 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,  EMG nerve conduction study on March 27, 2021 also demonstrated widespread chronic neuropathic changes, suggestive of mild motor neuron involvement, but with her slow progressive clinical course of 10 years, is not consistent with typical motor neuron disease,  Treatment of hepatitis C with Harvoni in May 2021  MRI of the brain March 2021 showed mild age-related generalized atrophy, small vessel disease  More extensive laboratory evaluation to rule out treatable etiology  Worsening low back pain, radiating pain to left lower extremity   Evidence of moderate lumbar stenosis at L4-5,  Cymbalta 60 mg daily, gabapentin 100 mg 3 times a day    DIAGNOSTIC DATA (LABS, IMAGING, TESTING) - I reviewed patient records, labs, notes, testing and imaging myself where available.  MRI of lumbar spine in April 2022, multilevel degenerative changes, most obvious at L4-5, with moderate spinal stenosis, bilateral foraminal stenosis due to disc bulging, facet hypertrophy, incidental left renal cyst 4.4 cm  Also reviewed MRI of the brain in March 2021, generalized atrophy, age disproportional, mild small vessel disease  CT of cervical spine February 2021, multilevel degenerative changes, no significant canal or foraminal narrowing  CT of the chest, small bilateral upper lobe groundglass nodule, stable from October 2020, cirrhosis with evidence of portal hypertension, left renal stone MRI of abdomen July 2021, stable 1.0 0.7 cm subcapsular lesion of the anterior lobe of the liver, with very minimal peripheral nodular enhancement, suggestive of a benign hemangioma, mild prominence of the central common bile duct, measuring up to 7 mm in caliber, mildly heterogeneous  liver parenchyma background enhancement and mild splenomegaly, in keeping with cirrhosis, trace perihepatic ascites, sludge is seen in the gallbladder  Laboratory evaluations: CBC, hemoglobin 13.1, slight decreased WBC of 3.6, normal BMP, creatinine of 0.58, TSH, negative HIV, lactic acid, UDS was positive for marijuana and benzodiazepine, negative alcohol, within normal range ammonia level, kappa/lambda light chain, protein electrophoresis panel  X-ray of left shoulder Mar 04, 2021, distracted, displaced fracture of the surgical neck of left humerus, glenohumeral joint remains in apposition  EMG nerve conduction study today March 25, 2021 showed widespread chronic neuropathic changes, most obvious at bilateral lumbosacral dermatomes, there was also slight chronic neuropathic changes noted at right cervical myotomes, right sternocleidomastoid.  There was no significant abnormality noted at right orbicularis oculi, frontalis, thoracic paraspinals.  HISTORY OF PRESENT ILLNESS: Kelly Singh is a 65 year old female, seen in request by her primary care physician Dr. Arline Asp, Mitzi Hansen for evaluation of speech difficulty, gait abnormality.  Initial evaluation was on November 03, 2019.  Past medical history of depression, migraine headache, chronic low back pain   Around 2001, she first noticed problem with walking, she denies any previously balance problem, was still riding a motorcycle, then began to notice unsteady gait, getting off path while walking, then began to stumble, and fall, she often tripped on obstacles, in 2001, she had a fall, resulting ankle fracture, her balance then progressively worsened, then noticed difficulty even standing still,   Since 2018, she noticed mild slurred speech, denies trouble swallowing, mild trouble with hand coordination, she denies tremor   She previously lived in [place], had extensive evaluation at that time, then moved to [place] for couple years, was evaluated by  movement disorder center Dr, Thermon Leyland on  November 02, 2017, she was given the diagnosis of 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, and slow progression of motor symptoms, she was given a trial of carbidopa-levodopa 25/100 mg to 1 tablet 3 times a day, she developed nausea, there was no significant improvement, she was later referred to physical therapy,   She moved to [facility] in October 2020, is referred here to establish neurological care,   She denied a family history of gait problem,   Laboratory evaluations in January 2021 showed mild elevated alkaline phosphate 146, total bilirubin of 2.1, albumin was decreased 3.3, mild elevated AST 68, ALT 63, calcium was decreased 8.0   Nonreactive hepatitis B surface antibody, surface antigen, negative hepatitis C, elevated kappa free light chain 29.7, lambda light chain ratio 1.87  UPDATE Sep 13 2020: She came in without any assistant, complains of slow worsening slurred speech, gait abnormality, denies significant pain  She complains of leg muscle cramps in her leg and feet, also complains of low back pain, radiating pain to left hip, left leg give out underneath her sometimes, she also complained frequent bilateral lower extremity, leg and feet muscle cramps, especially at nighttime, she also complains of intermittent numbness at bilateral shin, but denies bilateral feet paresthesia, she has urinary urgency, occasionally incontinence since 2020,  Reported abnormal x-ray of lumbar spine, showed degenerative changes, but I do not have the report  She has been treated for hepatitis C, 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[employer], in a seated position, preparing vegetable  UPDATE March 27 2021: She continue to progress slowly, complains of worsening dysarthria, mild dysphagia depend on the texture of the food, also worsening gait abnormality, fell multiple times, suffered left humeral fracture recently, planning on elective surgery tomorrow  She also complains of  intermittent low back pain, frequent bilateral calf muscle cramp, difficulty sleeping at nighttime, intermittent bilateral feet paresthesia, worsening bladder incontinence, she rely on her walker, cane, wear depends now, also noticed intermittent bilateral upper extremity paresthesia, and loss of the dextrality We personally reviewed MRI of lumbar spine in April 2022, multilevel degenerative changes, most obvious at L4-5, with moderate spinal stenosis, bilateral foraminal stenosis due to disc bulging, facet hypertrophy, incidental left renal cyst 4.4 cm  Also reviewed MRI of the brain in March 2021, generalized atrophy, age disproportional, mild small vessel disease  CT of cervical spine February 2021, multilevel degenerative changes, no significant canal or foraminal narrowing  CT of the chest, small bilateral upper lobe groundglass nodule, stable from October 2020, cirrhosis with evidence of portal hypertension, left renal stone MRI of abdomen July 2021, stable 1.0 0.7 cm subcapsular lesion of the anterior lobe of the liver, with very minimal peripheral nodular enhancement, suggestive of a benign hemangioma, mild prominence of the central common bile duct, measuring up to 7 mm in caliber, mildly heterogeneous liver parenchyma background enhancement and mild splenomegaly, in keeping with cirrhosis, trace perihepatic ascites, sludge is seen in the gallbladder  Laboratory evaluations: CBC, hemoglobin 13.1, slight decreased WBC of 3.6, normal BMP, creatinine of 0.58, TSH, negative HIV, lactic acid, UDS was positive for marijuana and benzodiazepine, negative alcohol, within normal range ammonia level, kappa/lambda light chain, protein electrophoresis panel  X-ray of left shoulder Mar 04, 2021, distracted, displaced fracture of the surgical neck of left humerus, glenohumeral joint remains in apposition  EMG nerve conduction study today March 25, 2021 showed widespread chronic neuropathic changes, most  obvious at bilateral lumbosacral dermatomes, there was also slight chronic neuropathic changes noted at right cervical myotomes, right  sternocleidomastoid.  There was no significant abnormality noted at right orbicularis oculi, frontalis, thoracic paraspinals.   REVIEW OF SYSTEMS: Out of a complete 14 system review of symptoms, the patient complains only of the following symptoms, and all other reviewed systems are negative.  Walking difficulty.  ALLERGIES: Allergies  Allergen Reactions   Latex Itching, Swelling, Rash and Other (See Comments)    Eats through skin; blister    Adhesive [Tape] Other (See Comments)    Plastic tape eats through skin - pls use paper tape    HOME MEDICATIONS: Outpatient Medications Prior to Visit  Medication Sig Dispense Refill   acetaminophen (TYLENOL) 325 MG tablet Take 2 tablets (650 mg total) by mouth every 6 (six) hours as needed for mild pain or headache.     baclofen (LIORESAL) 20 MG tablet Take 20 mg by mouth as needed for muscle spasms.     DULoxetine (CYMBALTA) 60 MG capsule Take 1 capsule (60 mg total) by mouth daily. 30 capsule 12   folic acid (FOLVITE) 1 MG tablet Take 1 tablet (1 mg total) by mouth daily.     gabapentin (NEURONTIN) 100 MG capsule Take 1 capsule (100 mg total) by mouth 3 (three) times daily. 90 capsule 11   HYDROcodone-acetaminophen (NORCO/VICODIN) 5-325 MG tablet Take 1 tablet by mouth every 6 (six) hours as needed (pain). 10 tablet 0   Multiple Vitamin (MULTIVITAMIN WITH MINERALS) TABS tablet Take 1 tablet by mouth daily. 30 tablet 11   Omega-3 Fatty Acids (FISH OIL PO) Take 1 capsule by mouth daily.     ondansetron (ZOFRAN ODT) 4 MG disintegrating tablet Take 1 tablet (4 mg total) by mouth every 8 (eight) hours as needed for nausea or vomiting. (Patient not taking: No sig reported) 6 tablet 0   thiamine 100 MG tablet Take 1 tablet (100 mg total) by mouth daily. 30 tablet 2   Vitamin D, Cholecalciferol, 25 MCG (1000 UT) TABS  Take 1 tablet by mouth daily.     No facility-administered medications prior to visit.    PAST MEDICAL HISTORY: Past Medical History:  Diagnosis Date   Arthritis    Ataxia    Cirrhosis ([place])    Depression with anxiety    Fibromyalgia    Gallbladder sludge    Hiatal hernia    Migraine    Substance abuse ([place])    in 1970's    PAST SURGICAL HISTORY: Past Surgical History:  Procedure Laterality Date   ANKLE SURGERY Right 2011   BREAST ENHANCEMENT SURGERY  1996   TUBAL LIGATION  1979   WRIST SURGERY Right 2016    FAMILY HISTORY: Family History  Problem Relation Age of Onset   Bone cancer Mother    Esophageal cancer Father     SOCIAL HISTORY: Social History   Socioeconomic History   Marital status: Single    Spouse name: Not on file   Number of children: 1   Years of education: college   Highest education level: Associate degree: occupational, Hotel manager, or vocational program  Occupational History   Occupation: prep food  Tobacco Use   Smoking status: Former    Pack years: 0.00    Types: Cigarettes    Quit date: 1998    Years since quitting: 24.4   Smokeless tobacco: Never  Vaping Use   Vaping Use: Never used  Substance and Sexual Activity   Alcohol use: Yes    Comment: occasional   Drug use: Never   Sexual activity: Not on  file  Other Topics Concern   Not on file  Social History Narrative   Lives at home with her granddaughter.   Left-handed.   Caffeine use:c 1-2 cups per day.   Social Determinants of Health   Financial Resource Strain: Not on file  Food Insecurity: Not on file  Transportation Needs: Not on file  Physical Activity: Not on file  Stress: Not on file  Social Connections: Not on file  Intimate Partner Violence: Not on file   PHYSICAL EXAM   PHYSICAL EXAMNIATION:  Gen: NAD, conversant, well nourised, well groomed                     Cardiovascular: Regular rate rhythm, no peripheral edema, warm, nontender. Eyes: Conjunctivae  clear without exudates or hemorrhage Neck: Supple, no carotid bruits. Pulmonary: Clear to auscultation bilaterally   NEUROLOGICAL EXAM:  MENTAL STATUS: Speech/Cognition: Awake, alert, normal speech, oriented to history taking and casual conversation.  CRANIAL NERVES: CN II: Visual fields are full to confrontation.  Pupils are round equal and briskly reactive to light. CN III, IV, VI: extraocular movement are normal. No ptosis. CN V: Facial sensation is intact to light touch. CN VII: Face is symmetric with normal eye closure and smile. CN VIII: Hearing is normal to casual conversation CN IX, X: Palate elevates symmetrically. Phonation is normal. CN XI: Head turning and shoulder shrug are intact CN XII: Tongue is midline with normal movements and no atrophy.  MOTOR: Left arm in sling, mild right shoulder abduction, external rotation weakness, right elbow flexion, extension weakness, no significant right arm distal weakness, there was no significant rigidity, bradykinesia, Mild to moderate bilateral hip flexion weakness, no significant knee flexion, extension, ankle dorsiflexion, extension weakness.   REFLEXES: Reflexes are 3 and symmetric at the biceps, triceps, knees and absent at ankles. Plantar responses are extensor bilaterally  SENSORY: Intact to light touch, pinprick, positional and vibratory sensation at fingers and toes.  COORDINATION:  Mild truncal ataxia, mild to moderate right arm, bilateral lower extremity dysdiadochokinesia, mild bilateral lower extremity heel-to-shin dysmetria  GAIT/STANCE: Need push-up to get up from seated position, wide-based, unsteady, also limited by her left arm pain, in sling   Marcial Pacas, M.D. Ph.D.  [facility] [place], [place] [address] Phone: [phone] Fax:      [phone]

## 2021-03-28 DIAGNOSIS — S42292A Other displaced fracture of upper end of left humerus, initial encounter for closed fracture: Secondary | ICD-10-CM | POA: Diagnosis not present

## 2021-03-28 DIAGNOSIS — Z87891 Personal history of nicotine dependence: Secondary | ICD-10-CM | POA: Diagnosis not present

## 2021-03-28 DIAGNOSIS — S42202A Unspecified fracture of upper end of left humerus, initial encounter for closed fracture: Secondary | ICD-10-CM | POA: Diagnosis not present

## 2021-03-28 DIAGNOSIS — S42212A Unspecified displaced fracture of surgical neck of left humerus, initial encounter for closed fracture: Secondary | ICD-10-CM | POA: Diagnosis not present

## 2021-03-28 DIAGNOSIS — Z91048 Other nonmedicinal substance allergy status: Secondary | ICD-10-CM | POA: Diagnosis not present

## 2021-03-28 DIAGNOSIS — Z91040 Latex allergy status: Secondary | ICD-10-CM | POA: Diagnosis not present

## 2021-03-28 DIAGNOSIS — Z79899 Other long term (current) drug therapy: Secondary | ICD-10-CM | POA: Diagnosis not present

## 2021-03-28 DIAGNOSIS — M069 Rheumatoid arthritis, unspecified: Secondary | ICD-10-CM | POA: Diagnosis not present

## 2021-03-28 DIAGNOSIS — F32A Depression, unspecified: Secondary | ICD-10-CM | POA: Diagnosis not present

## 2021-03-28 DIAGNOSIS — M797 Fibromyalgia: Secondary | ICD-10-CM | POA: Diagnosis not present

## 2021-03-28 DIAGNOSIS — G8918 Other acute postprocedural pain: Secondary | ICD-10-CM | POA: Diagnosis not present

## 2021-03-28 DIAGNOSIS — G1111 Friedreich ataxia: Secondary | ICD-10-CM | POA: Diagnosis not present

## 2021-03-28 DIAGNOSIS — S43212A Anterior subluxation of left sternoclavicular joint, initial encounter: Secondary | ICD-10-CM | POA: Diagnosis not present

## 2021-03-29 DIAGNOSIS — M069 Rheumatoid arthritis, unspecified: Secondary | ICD-10-CM | POA: Diagnosis not present

## 2021-03-29 DIAGNOSIS — Z79899 Other long term (current) drug therapy: Secondary | ICD-10-CM | POA: Diagnosis not present

## 2021-03-29 DIAGNOSIS — G1111 Friedreich ataxia: Secondary | ICD-10-CM | POA: Diagnosis not present

## 2021-03-29 DIAGNOSIS — Z91040 Latex allergy status: Secondary | ICD-10-CM | POA: Diagnosis not present

## 2021-03-29 DIAGNOSIS — Z91048 Other nonmedicinal substance allergy status: Secondary | ICD-10-CM | POA: Diagnosis not present

## 2021-03-29 DIAGNOSIS — Z87891 Personal history of nicotine dependence: Secondary | ICD-10-CM | POA: Diagnosis not present

## 2021-03-29 DIAGNOSIS — M797 Fibromyalgia: Secondary | ICD-10-CM | POA: Diagnosis not present

## 2021-03-29 DIAGNOSIS — F32A Depression, unspecified: Secondary | ICD-10-CM | POA: Diagnosis not present

## 2021-03-29 DIAGNOSIS — S42212A Unspecified displaced fracture of surgical neck of left humerus, initial encounter for closed fracture: Secondary | ICD-10-CM | POA: Diagnosis not present

## 2021-03-30 DIAGNOSIS — M069 Rheumatoid arthritis, unspecified: Secondary | ICD-10-CM | POA: Diagnosis not present

## 2021-03-30 DIAGNOSIS — Z87891 Personal history of nicotine dependence: Secondary | ICD-10-CM | POA: Diagnosis not present

## 2021-03-30 DIAGNOSIS — Z79899 Other long term (current) drug therapy: Secondary | ICD-10-CM | POA: Diagnosis not present

## 2021-03-30 DIAGNOSIS — F32A Depression, unspecified: Secondary | ICD-10-CM | POA: Diagnosis not present

## 2021-03-30 DIAGNOSIS — M797 Fibromyalgia: Secondary | ICD-10-CM | POA: Diagnosis not present

## 2021-03-30 DIAGNOSIS — Z91040 Latex allergy status: Secondary | ICD-10-CM | POA: Diagnosis not present

## 2021-03-30 DIAGNOSIS — S42212A Unspecified displaced fracture of surgical neck of left humerus, initial encounter for closed fracture: Secondary | ICD-10-CM | POA: Diagnosis not present

## 2021-03-30 DIAGNOSIS — G1111 Friedreich ataxia: Secondary | ICD-10-CM | POA: Diagnosis not present

## 2021-03-30 DIAGNOSIS — Z91048 Other nonmedicinal substance allergy status: Secondary | ICD-10-CM | POA: Diagnosis not present

## 2021-03-31 DIAGNOSIS — G1111 Friedreich ataxia: Secondary | ICD-10-CM | POA: Diagnosis not present

## 2021-03-31 DIAGNOSIS — S42212A Unspecified displaced fracture of surgical neck of left humerus, initial encounter for closed fracture: Secondary | ICD-10-CM | POA: Diagnosis not present

## 2021-03-31 DIAGNOSIS — Z79899 Other long term (current) drug therapy: Secondary | ICD-10-CM | POA: Diagnosis not present

## 2021-03-31 DIAGNOSIS — F32A Depression, unspecified: Secondary | ICD-10-CM | POA: Diagnosis not present

## 2021-03-31 DIAGNOSIS — Z91040 Latex allergy status: Secondary | ICD-10-CM | POA: Diagnosis not present

## 2021-03-31 DIAGNOSIS — Z91048 Other nonmedicinal substance allergy status: Secondary | ICD-10-CM | POA: Diagnosis not present

## 2021-03-31 DIAGNOSIS — M069 Rheumatoid arthritis, unspecified: Secondary | ICD-10-CM | POA: Diagnosis not present

## 2021-03-31 DIAGNOSIS — M797 Fibromyalgia: Secondary | ICD-10-CM | POA: Diagnosis not present

## 2021-03-31 DIAGNOSIS — Z87891 Personal history of nicotine dependence: Secondary | ICD-10-CM | POA: Diagnosis not present

## 2021-04-01 DIAGNOSIS — Z87891 Personal history of nicotine dependence: Secondary | ICD-10-CM | POA: Diagnosis not present

## 2021-04-01 DIAGNOSIS — F32A Depression, unspecified: Secondary | ICD-10-CM | POA: Diagnosis not present

## 2021-04-01 DIAGNOSIS — Z91040 Latex allergy status: Secondary | ICD-10-CM | POA: Diagnosis not present

## 2021-04-01 DIAGNOSIS — M069 Rheumatoid arthritis, unspecified: Secondary | ICD-10-CM | POA: Diagnosis not present

## 2021-04-01 DIAGNOSIS — Z79899 Other long term (current) drug therapy: Secondary | ICD-10-CM | POA: Diagnosis not present

## 2021-04-01 DIAGNOSIS — M797 Fibromyalgia: Secondary | ICD-10-CM | POA: Diagnosis not present

## 2021-04-01 DIAGNOSIS — G1111 Friedreich ataxia: Secondary | ICD-10-CM | POA: Diagnosis not present

## 2021-04-01 DIAGNOSIS — S42212A Unspecified displaced fracture of surgical neck of left humerus, initial encounter for closed fracture: Secondary | ICD-10-CM | POA: Diagnosis not present

## 2021-04-01 DIAGNOSIS — Z91048 Other nonmedicinal substance allergy status: Secondary | ICD-10-CM | POA: Diagnosis not present

## 2021-04-03 ENCOUNTER — Emergency Department (HOSPITAL_COMMUNITY): Payer: Medicare Other

## 2021-04-03 ENCOUNTER — Inpatient Hospital Stay (HOSPITAL_COMMUNITY)
Admission: EM | Admit: 2021-04-03 | Discharge: 2021-04-07 | DRG: 178 | Disposition: A | Discharge status: Home Health | Payer: Medicare Other | Attending: Internal Medicine | Admitting: Internal Medicine

## 2021-04-03 ENCOUNTER — Encounter (HOSPITAL_COMMUNITY): Payer: Self-pay

## 2021-04-03 ENCOUNTER — Other Ambulatory Visit: Payer: Self-pay

## 2021-04-03 DIAGNOSIS — R269 Unspecified abnormalities of gait and mobility: Secondary | ICD-10-CM

## 2021-04-03 DIAGNOSIS — Z91048 Other nonmedicinal substance allergy status: Secondary | ICD-10-CM | POA: Diagnosis not present

## 2021-04-03 DIAGNOSIS — G43909 Migraine, unspecified, not intractable, without status migrainosus: Secondary | ICD-10-CM | POA: Diagnosis not present

## 2021-04-03 DIAGNOSIS — K5900 Constipation, unspecified: Secondary | ICD-10-CM | POA: Diagnosis present

## 2021-04-03 DIAGNOSIS — F32A Depression, unspecified: Secondary | ICD-10-CM | POA: Diagnosis present

## 2021-04-03 DIAGNOSIS — R7989 Other specified abnormal findings of blood chemistry: Secondary | ICD-10-CM | POA: Diagnosis not present

## 2021-04-03 DIAGNOSIS — G3281 Cerebellar ataxia in diseases classified elsewhere: Secondary | ICD-10-CM | POA: Diagnosis not present

## 2021-04-03 DIAGNOSIS — B182 Chronic viral hepatitis C: Secondary | ICD-10-CM | POA: Diagnosis present

## 2021-04-03 DIAGNOSIS — M545 Low back pain, unspecified: Secondary | ICD-10-CM | POA: Diagnosis present

## 2021-04-03 DIAGNOSIS — Z66 Do not resuscitate: Secondary | ICD-10-CM | POA: Diagnosis not present

## 2021-04-03 DIAGNOSIS — K7469 Other cirrhosis of liver: Secondary | ICD-10-CM | POA: Diagnosis present

## 2021-04-03 DIAGNOSIS — M6282 Rhabdomyolysis: Secondary | ICD-10-CM | POA: Diagnosis present

## 2021-04-03 DIAGNOSIS — Z28310 Unvaccinated for covid-19: Secondary | ICD-10-CM

## 2021-04-03 DIAGNOSIS — U071 COVID-19: Secondary | ICD-10-CM | POA: Diagnosis not present

## 2021-04-03 DIAGNOSIS — R5381 Other malaise: Secondary | ICD-10-CM | POA: Diagnosis not present

## 2021-04-03 DIAGNOSIS — R748 Abnormal levels of other serum enzymes: Secondary | ICD-10-CM

## 2021-04-03 DIAGNOSIS — G119 Hereditary ataxia, unspecified: Secondary | ICD-10-CM | POA: Diagnosis present

## 2021-04-03 DIAGNOSIS — M797 Fibromyalgia: Secondary | ICD-10-CM | POA: Diagnosis present

## 2021-04-03 DIAGNOSIS — Z7401 Bed confinement status: Secondary | ICD-10-CM | POA: Diagnosis not present

## 2021-04-03 DIAGNOSIS — D472 Monoclonal gammopathy: Secondary | ICD-10-CM | POA: Diagnosis present

## 2021-04-03 DIAGNOSIS — Z91040 Latex allergy status: Secondary | ICD-10-CM | POA: Diagnosis not present

## 2021-04-03 DIAGNOSIS — R296 Repeated falls: Secondary | ICD-10-CM | POA: Diagnosis not present

## 2021-04-03 DIAGNOSIS — Z79899 Other long term (current) drug therapy: Secondary | ICD-10-CM

## 2021-04-03 DIAGNOSIS — Z87891 Personal history of nicotine dependence: Secondary | ICD-10-CM

## 2021-04-03 DIAGNOSIS — D638 Anemia in other chronic diseases classified elsewhere: Secondary | ICD-10-CM | POA: Diagnosis not present

## 2021-04-03 DIAGNOSIS — D709 Neutropenia, unspecified: Secondary | ICD-10-CM | POA: Diagnosis present

## 2021-04-03 DIAGNOSIS — G8929 Other chronic pain: Secondary | ICD-10-CM | POA: Diagnosis not present

## 2021-04-03 DIAGNOSIS — R509 Fever, unspecified: Secondary | ICD-10-CM | POA: Diagnosis not present

## 2021-04-03 DIAGNOSIS — R29898 Other symptoms and signs involving the musculoskeletal system: Secondary | ICD-10-CM | POA: Diagnosis not present

## 2021-04-03 DIAGNOSIS — E876 Hypokalemia: Secondary | ICD-10-CM | POA: Diagnosis present

## 2021-04-03 DIAGNOSIS — D6959 Other secondary thrombocytopenia: Secondary | ICD-10-CM | POA: Diagnosis present

## 2021-04-03 DIAGNOSIS — E871 Hypo-osmolality and hyponatremia: Secondary | ICD-10-CM | POA: Diagnosis not present

## 2021-04-03 DIAGNOSIS — R531 Weakness: Secondary | ICD-10-CM | POA: Diagnosis not present

## 2021-04-03 DIAGNOSIS — W19XXXA Unspecified fall, initial encounter: Secondary | ICD-10-CM | POA: Diagnosis not present

## 2021-04-03 DIAGNOSIS — F1910 Other psychoactive substance abuse, uncomplicated: Secondary | ICD-10-CM | POA: Diagnosis present

## 2021-04-03 DIAGNOSIS — Z743 Need for continuous supervision: Secondary | ICD-10-CM | POA: Diagnosis not present

## 2021-04-03 DIAGNOSIS — R42 Dizziness and giddiness: Secondary | ICD-10-CM | POA: Diagnosis not present

## 2021-04-03 LAB — URINALYSIS, ROUTINE W REFLEX MICROSCOPIC
Bacteria, UA: NONE SEEN
Bilirubin Urine: NEGATIVE
Glucose, UA: NEGATIVE mg/dL
Hgb urine dipstick: NEGATIVE
Ketones, ur: NEGATIVE mg/dL
Leukocytes,Ua: NEGATIVE
Nitrite: NEGATIVE
Protein, ur: 30 mg/dL — AB
Specific Gravity, Urine: 1.018 (ref 1.005–1.030)
pH: 5 (ref 5.0–8.0)

## 2021-04-03 LAB — BASIC METABOLIC PANEL
Anion gap: 9 (ref 5–15)
BUN: 12 mg/dL (ref 8–23)
CO2: 25 mmol/L (ref 22–32)
Calcium: 8.6 mg/dL — ABNORMAL LOW (ref 8.9–10.3)
Chloride: 100 mmol/L (ref 98–111)
Creatinine, Ser: 0.67 mg/dL (ref 0.44–1.00)
GFR, Estimated: >60 mL/min (ref >60)
Glucose, Bld: 104 mg/dL — ABNORMAL HIGH (ref 70–99)
Potassium: 3.1 mmol/L — ABNORMAL LOW (ref 3.5–5.1)
Sodium: 134 mmol/L — ABNORMAL LOW (ref 135–145)

## 2021-04-03 LAB — POC OCCULT BLOOD, ED: Fecal Occult Bld: NEGATIVE

## 2021-04-03 LAB — SARS CORONAVIRUS 2 (TAT 6-24 HRS): SARS Coronavirus 2: POSITIVE — AB

## 2021-04-03 LAB — CBC WITH DIFFERENTIAL/PLATELET
Abs Immature Granulocytes: 0 10*3/uL (ref 0.00–0.07)
Basophils Absolute: 0 10*3/uL (ref 0.0–0.1)
Basophils Relative: 0 %
Eosinophils Absolute: 0 10*3/uL (ref 0.0–0.5)
Eosinophils Relative: 0 %
HCT: 29.7 % — ABNORMAL LOW (ref 36.0–46.0)
Hemoglobin: 9.6 g/dL — ABNORMAL LOW (ref 12.0–15.0)
Immature Granulocytes: 0 %
Lymphocytes Relative: 27 %
Lymphs Abs: 0.9 10*3/uL (ref 0.7–4.0)
MCH: 32 pg (ref 26.0–34.0)
MCHC: 32.3 g/dL (ref 30.0–36.0)
MCV: 99 fL (ref 80.0–100.0)
Monocytes Absolute: 0.4 10*3/uL (ref 0.1–1.0)
Monocytes Relative: 10 %
Neutro Abs: 2.1 10*3/uL (ref 1.7–7.7)
Neutrophils Relative %: 63 %
Platelets: 145 10*3/uL — ABNORMAL LOW (ref 150–400)
RBC: 3 MIL/uL — ABNORMAL LOW (ref 3.87–5.11)
RDW: 14.3 % (ref 11.5–15.5)
WBC: 3.5 10*3/uL — ABNORMAL LOW (ref 4.0–10.5)
nRBC: 0 % (ref 0.0–0.2)

## 2021-04-03 LAB — CBG MONITORING, ED: Glucose-Capillary: 101 mg/dL — ABNORMAL HIGH (ref 70–99)

## 2021-04-03 LAB — CK: Total CK: 5810 U/L — ABNORMAL HIGH (ref 38–234)

## 2021-04-03 MED ORDER — POLYETHYLENE GLYCOL 3350 17 G PO PACK
17.0000 g | PACK | Freq: Every day | ORAL | Status: DC | PRN
  Administered 2021-04-05: 17 g via ORAL
  Filled 2021-04-03: qty 1

## 2021-04-03 MED ORDER — SODIUM CHLORIDE 0.9 % IV BOLUS
1000.0000 mL | Freq: Once | INTRAVENOUS | Status: AC
  Administered 2021-04-03: 1000 mL via INTRAVENOUS

## 2021-04-03 MED ORDER — LACTATED RINGERS IV SOLN: INTRAVENOUS | Status: DC

## 2021-04-03 MED ORDER — THIAMINE HCL 100 MG PO TABS
100.0000 mg | ORAL_TABLET | Freq: Every day | ORAL | Status: DC
  Administered 2021-04-04 – 2021-04-07 (×4): 100 mg via ORAL
  Filled 2021-04-03 (×4): qty 1

## 2021-04-03 MED ORDER — ACETAMINOPHEN 325 MG PO TABS
650.0000 mg | ORAL_TABLET | Freq: Four times a day (QID) | ORAL | Status: DC | PRN
Start: 2021-04-03 — End: 2021-04-08
  Filled 2021-04-03: qty 2

## 2021-04-03 MED ORDER — SODIUM CHLORIDE 0.9% FLUSH
3.0000 mL | Freq: Two times a day (BID) | INTRAVENOUS | Status: DC
  Administered 2021-04-04 – 2021-04-07 (×7): 3 mL via INTRAVENOUS

## 2021-04-03 MED ORDER — GABAPENTIN 100 MG PO CAPS
100.0000 mg | ORAL_CAPSULE | Freq: Three times a day (TID) | ORAL | Status: DC
  Administered 2021-04-04 – 2021-04-07 (×11): 100 mg via ORAL
  Filled 2021-04-03 (×12): qty 1

## 2021-04-03 MED ORDER — ACETAMINOPHEN 650 MG RE SUPP: 650.0000 mg | Freq: Four times a day (QID) | RECTAL | Status: DC | PRN

## 2021-04-03 MED ORDER — FOLIC ACID 1 MG PO TABS
1.0000 mg | ORAL_TABLET | Freq: Every day | ORAL | Status: DC
  Administered 2021-04-04 – 2021-04-07 (×4): 1 mg via ORAL
  Filled 2021-04-03 (×4): qty 1

## 2021-04-03 MED ORDER — ENOXAPARIN SODIUM 40 MG/0.4ML IJ SOSY
40.0000 mg | PREFILLED_SYRINGE | Freq: Every day | INTRAMUSCULAR | Status: DC
  Administered 2021-04-04 – 2021-04-07 (×4): 40 mg via SUBCUTANEOUS
  Filled 2021-04-03 (×4): qty 0.4

## 2021-04-03 MED ORDER — DULOXETINE HCL 60 MG PO CPEP
60.0000 mg | ORAL_CAPSULE | Freq: Every day | ORAL | Status: DC
  Administered 2021-04-04 – 2021-04-07 (×4): 60 mg via ORAL
  Filled 2021-04-03 (×5): qty 1

## 2021-04-03 NOTE — ED Provider Notes (Signed)
[facility] EMERGENCY DEPARTMENT Provider Note   CSN: [id number] Arrival date & time: 04/03/21  1640     History Chief Complaint  Patient presents with   Weakness    Kelly Singh is a 65 y.o. female.  The history is provided by the patient.  Weakness Severity:  Mild Onset quality:  Gradual Duration:  3 days Timing:  Constant Progression:  Unchanged Chronicity:  Recurrent Context comment:  Recent left shoulder surgery, been home for three days and having a hard time taking care of herself. Was to weak to get off toilet and crawled on the floor for 12+ hours today until eventually able to call for help. Relieved by:  Nothing Worsened by:  Nothing Associated symptoms: arthralgias and difficulty walking   Associated symptoms: no abdominal pain, no anorexia, no aphasia, no ataxia, no chest pain, no cough, no dysuria, no fever, no seizures, no shortness of breath and no vomiting   Risk factors: neurologic disease (cerbellar ataxia)       Past Medical History:  Diagnosis Date   Arthritis    Ataxia    Cirrhosis ([place])    Depression with anxiety    Fibromyalgia    Gallbladder sludge    Hiatal hernia    Migraine    Substance abuse ([place])    in 1970's    Patient Active Problem List   Diagnosis Date Noted   Weakness 38/25/0539   Acute metabolic encephalopathy 76/73/4193   Closed fracture of humerus, surgical neck 03/06/2021   Polysubstance abuse ([place]) 03/06/2021   Cerebellar ataxia in diseases classified elsewhere ([place]) 09/13/2020   Chronic low back pain 09/13/2020   Slurred speech 03/08/2020   Other cirrhosis of liver ([place]) 12/05/2019   Dizzy 11/09/2019   Fall 11/09/2019   Hypomagnesemia 11/09/2019   Scalp laceration 11/09/2019   Alcohol intoxication ([place]) 11/09/2019   Hypokalemia    Gait abnormality 11/03/2019   Chronic hepatitis C without hepatic coma ([place]) 11/01/2019    Past Surgical History:  Procedure Laterality Date   ANKLE SURGERY  Right 2011   [place]   WRIST SURGERY Right 2016     OB History   No obstetric history on file.     Family History  Problem Relation Age of Onset   Bone cancer Mother    Esophageal cancer Father     Social History   Tobacco Use   Smoking status: Former    Pack years: 0.00    Types: Cigarettes    Quit date: 1998    Years since quitting: 24.5   Smokeless tobacco: Never  Vaping Use   Vaping Use: Never used  Substance Use Topics   Alcohol use: Yes    Comment: occasional   Drug use: Never    Home Medications Prior to Admission medications   Medication Sig Start Date End Date Taking? Authorizing Provider  baclofen (LIORESAL) 20 MG tablet Take 20 mg by mouth as needed for muscle spasms.   Yes [provider]  DULoxetine (CYMBALTA) 60 MG capsule Take 1 capsule (60 mg total) by mouth daily. 03/27/21  Yes Marcial Pacas, MD  folic acid (FOLVITE) 1 MG tablet Take 1 tablet (1 mg total) by mouth daily. 03/11/21 06/09/21 Yes Pokhrel, Laxman, MD  gabapentin (NEURONTIN) 100 MG capsule Take 1 capsule (100 mg total) by mouth 3 (three) times daily. 03/27/21  Yes Marcial Pacas, MD  HYDROcodone-acetaminophen (NORCO/VICODIN) 5-325 MG tablet Take 1 tablet  by mouth every 6 (six) hours as needed (pain). 03/11/21 03/11/22 Yes Pokhrel, Laxman, MD  Multiple Vitamin (MULTIVITAMIN WITH MINERALS) TABS tablet Take 1 tablet by mouth daily. 03/11/21 03/11/22 Yes Pokhrel, Laxman, MD  naproxen sodium (ALEVE) 220 MG tablet Take 220 mg by mouth.   Yes [provider]  Omega-3 Fatty Acids (FISH OIL PO) Take 1 capsule by mouth daily.   Yes [provider]  thiamine 100 MG tablet Take 1 tablet (100 mg total) by mouth daily. 03/11/21 06/09/21 Yes Pokhrel, Laxman, MD  Vitamin D, Cholecalciferol, 25 MCG (1000 UT) TABS Take 1 tablet by mouth daily.   Yes [provider]  acetaminophen (TYLENOL) 325 MG tablet Take 2 tablets (650 mg total) by mouth every 6  (six) hours as needed for mild pain or headache. Patient not taking: No sig reported 03/11/21 03/11/22  Pokhrel, Corrie Mckusick, MD  ondansetron (ZOFRAN ODT) 4 MG disintegrating tablet Take 1 tablet (4 mg total) by mouth every 8 (eight) hours as needed for nausea or vomiting. Patient not taking: No sig reported 02/25/21   [place] Lanius A, PA-C    Allergies    Latex and Adhesive [tape]  Review of Systems   Review of Systems  Constitutional:  Negative for chills and fever.  HENT:  Negative for ear pain and sore throat.   Eyes:  Negative for pain and visual disturbance.  Respiratory:  Negative for cough and shortness of breath.   Cardiovascular:  Negative for chest pain and palpitations.  Gastrointestinal:  Negative for abdominal pain, anorexia and vomiting.  Genitourinary:  Negative for dysuria and hematuria.  Musculoskeletal:  Positive for arthralgias and gait problem. Negative for back pain.  Skin:  Negative for color change and rash.  Neurological:  Positive for weakness. Negative for seizures and syncope.  All other systems reviewed and are negative.  Physical Exam Updated Vital Signs  ED Triage Vitals  Enc Vitals Group     BP 04/03/21 1727 (!) 147/81     Pulse Rate 04/03/21 1727 84     Resp 04/03/21 1727 16     Temp 04/03/21 1727 98.3 F (36.8 C)     Temp Source 04/03/21 1727 Oral     SpO2 04/03/21 1727 98 %     Weight --      Height --      Head Circumference --      Peak Flow --      Pain Score 04/03/21 1847 8     Pain Loc --      Pain Edu? --      Excl. in [place]? --      Physical Exam Vitals and nursing note reviewed.  Constitutional:      General: She is not in acute distress.    Appearance: She is well-developed. She is not ill-appearing.  HENT:     Head: Normocephalic and atraumatic.     Nose: Nose normal.     Mouth/Throat:     Mouth: Mucous membranes are dry.  Eyes:     Extraocular Movements: Extraocular movements intact.     Conjunctiva/sclera: Conjunctivae  normal.     Pupils: Pupils are equal, round, and reactive to light.  Cardiovascular:     Rate and Rhythm: Normal rate and regular rhythm.     Pulses: Normal pulses.     Heart sounds: Normal heart sounds. No murmur heard. Pulmonary:     Effort: Pulmonary effort is normal. No respiratory distress.     Breath  sounds: Normal breath sounds.  Abdominal:     Palpations: Abdomen is soft.     Tenderness: There is no abdominal tenderness.  Genitourinary:    Rectum: Guaiac result negative.  Musculoskeletal:     Cervical back: Normal range of motion and neck supple.     Comments: Left shoulder surgical site is C/D/I   Skin:    General: Skin is warm and dry.     Capillary Refill: Capillary refill takes less than 2 seconds.  Neurological:     General: No focal deficit present.     Mental Status: She is alert and oriented to person, place, and time.     Cranial Nerves: No cranial nerve deficit.     Sensory: No sensory deficit.     Motor: No weakness.    ED Results / Procedures / Treatments   Labs (all labs ordered are listed, but only abnormal results are displayed) Labs Reviewed  SARS CORONAVIRUS 2 (TAT 6-24 HRS) - Abnormal; Notable for the following components:      Result Value   SARS Coronavirus 2 POSITIVE (*)    All other components within normal limits  BASIC METABOLIC PANEL - Abnormal; Notable for the following components:   Sodium 134 (*)    Potassium 3.1 (*)    Glucose, Bld 104 (*)    Calcium 8.6 (*)    All other components within normal limits  URINALYSIS, ROUTINE W REFLEX MICROSCOPIC - Abnormal; Notable for the following components:   Color, Urine AMBER (*)    Protein, ur 30 (*)    All other components within normal limits  CBC WITH DIFFERENTIAL/PLATELET - Abnormal; Notable for the following components:   WBC 3.5 (*)    RBC 3.00 (*)    Hemoglobin 9.6 (*)    HCT 29.7 (*)    Platelets 145 (*)    All other components within normal limits  CK - Abnormal; Notable for the  following components:   Total CK 5,810 (*)    All other components within normal limits  CBG MONITORING, ED - Abnormal; Notable for the following components:   Glucose-Capillary 101 (*)    All other components within normal limits  CULTURE, BLOOD (SINGLE)  LACTIC ACID, PLASMA  LACTIC ACID, PLASMA  D-DIMER, QUANTITATIVE  PROCALCITONIN  LACTATE DEHYDROGENASE  FERRITIN  TRIGLYCERIDES  FIBRINOGEN  C-REACTIVE PROTEIN  POC OCCULT BLOOD, ED    EKG EKG Interpretation  Date/Time:  Wednesday April 03 2021 21:40:17 EDT Ventricular Rate:  83 PR Interval:  123 QRS Duration: 82 QT Interval:  398 QTC Calculation: 468 R Axis:   70 Text Interpretation: Sinus rhythm Minimal ST depression, inferior leads Confirmed by Lennice Sites ([id number]) on 04/03/2021 10:08:06 PM  Radiology DG Chest Portable 1 View  Result Date: 04/03/2021 CLINICAL DATA:  Generalized weakness. EXAM: PORTABLE CHEST 1 VIEW COMPARISON:  March 06, 2021 FINDINGS: The heart size and mediastinal contours are within normal limits. Both lungs are clear. Radiopaque fixation plate and screws are seen within the proximal left humerus. Degenerative changes seen throughout the thoracic spine. IMPRESSION: No active cardiopulmonary disease. Electronically Signed   By: Virgina Norfolk M.D.   On: 04/03/2021 22:26    Procedures Procedures   Medications Ordered in ED Medications  sodium chloride 0.9 % bolus 1,000 mL (0 mLs Intravenous Stopped 04/03/21 2317)    ED Course  I have reviewed the triage vital signs and the nursing notes.  Pertinent labs & imaging results that were available during my care  of the patient were reviewed by me and considered in my medical decision making (see chart for details).    MDM Rules/Calculators/A&P                          Yaeko Fazekas is a 65 year old female with history of ataxia, fibromyalgia, substance abuse, alcohol abuse who presents the ED with weakness.  Normal vitals.  No fever.  States that  she recently had left shoulder surgery.  She has been home for several days.  Recent exposure to COVID but has not been tested.  She has not had much to eat or drink and overall feels dehydrated.  She lives by herself at this time.  She did not go to rehab after her surgery.  She has been having a hard time getting around with her walker being able to only use one hand.  Walking at baseline is difficult given her chronic ataxia.  She went to go use the bedside commode at home today and was too weak to stand up.  She was able to slide on the floor.  She did not hit her head.  She was too weak to crawl but eventually was able to make her way to a phone to call a family member who brought her here.  Patient thought she was on the floor for at least 12 hours.  She is have a lot of muscle spasms and generalized weakness.  Lab work was initiated prior to my evaluation.  Came for a CK of about 6000.  However urinalysis negative for infection.  Creatinine normal. hemolobin is 9.6 which is down from 13 from several weeks ago.  Hemoccult is negative.  Otherwise lab work is unremarkable.  COVID test was positive which is likely playing a role in her acute on chronic weakness.  Chest x-ray showed no evidence of inflammation.  No respiratory distress.  Patient given fluid bolus and will admit to medicine for further COVID care/weakness/dehydration.  This chart was dictated using voice recognition software.  Despite best efforts to proofread,  errors can occur which can change the documentation meaning.   Final Clinical Impression(s) / ED Diagnoses Final diagnoses:  Weakness  COVID-19  Elevated CK    Rx / DC Orders ED Discharge Orders     None        Lennice Sites, DO 04/03/21 2336

## 2021-04-03 NOTE — ED Provider Notes (Signed)
Emergency Medicine Provider Triage Evaluation Note  Kelly Singh , a 65 y.o. female  was evaluated in triage.  Pt complains of weakness since her recent surgery.  She states that she slipped down to the floor, did not hit her head.  She was released from [place].  Per triage/EMS daughter states that patient can be left alone and is requesting a COVID test as daughter stated to EMS that she was post COVID-positive and does not wear a mask around the patient.  Patient has not had any COVID vaccines. Patient tells me that she sat on the toilet for 12 hours because she was unable to get up.  She is tearful, will not answer additional questions..  Review of Systems  Positive: COVID exposure, focal weakness Negative: Fevers  Physical Exam  BP (!) 145/85 (BP Location: Right Arm)   Pulse 88   Temp 98.7 F (37.1 C) (Oral)   Resp 17   SpO2 100%  Gen:   Awake, tearful, will not answer my questions, instead asking when she can lay down.   Resp:  Normal effort  MSK:   Moves extremities without difficulty  Other:  Left upper extremity in sling which patient has uncliped  Medical Decision Making  Medically screening exam initiated at 6:56 PM.  Appropriate orders placed.  [name] was informed that the remainder of the evaluation will be completed by another provider, this initial triage assessment does not replace that evaluation, and the importance of remaining in the ED until their evaluation is complete.  Note: Portions of this report may have been transcribed using voice recognition software. Every effort was made to ensure accuracy; however, inadvertent computerized transcription errors may be present    Lorin Glass, PA-C 04/03/21 [place], [place], DO 04/03/21 1901

## 2021-04-03 NOTE — H&P (Signed)
History and Physical   Kelly Singh WNU:[id number] DOB: 05-23-1956 DOA: 04/03/2021  PCP: Janie Morning, DO   Patient coming from: Home  Chief Complaint: Weakness  HPI: Kelly Singh is a 65 y.o. female with medical history significant of alcohol use, substance use, cerebellar ataxia, chronic low back pain, cirrhosis, migraines, fibromyalgia, depression, IgG MGUS who presents with acute on chronic weakness.  Patient was seen for altered mental status at the beginning of this month here.  She was subsequently sent to a SNF and had a shoulder surgery performed while she was at the SNF and then returned to the SNF.  She has been home for 3 days has had significant acute on chronic weakness.  PT has not yet been able to come out to her house.  She has slipped to the floor.  Without hitting her head and now without loss of consciousness.  She states that earlier today she spent at least several hours possibly up to 12 hours on the toilet due to not having strength to get up but she was able to get down and crawl and call for help.  She has been home alone for the last few days. She reports above acute on chronic weakness as well as decreased p.o. intake.  She denies fevers, chills, chest pain, shortness of breath, abdominal pain, constipation, diarrhea, nausea, vomiting.  Daughter states patient reported being on the ground for at least half a day. She feels patient was discharged to early from SNF.  Exposed to Covid 6 days ago by positive family members.  ED Course: Vital signs in the ED were stable.  Lab work-up showed BMP with potassium of 3.1 and calcium of 8.6.  CBC showed hemoglobin of 9.6 down from baseline of 12-13, WBC 3.5, platelets 145.  CK elevated at 5800, respiratory panel flu COVID positive.  Chest x-ray without acute abnormality.  Urinalysis normal and FOBT negative.  1 L fluids ordered in the ED.  Labs for inflammation with headache and COVID were added on and are pending and  include pro time INR, D-dimer, procalcitonin, LDH, ferritin, triglycerides, fibrinogen, CRP, blood cultures, lactic acid.  Review of Systems: As per HPI otherwise all other systems reviewed and are negative.  Past Medical History:  Diagnosis Date   Arthritis    Ataxia    Cirrhosis ([place])    Depression with anxiety    Fibromyalgia    Gallbladder sludge    Hiatal hernia    Migraine    Substance abuse ([place])    in 1970's    Past Surgical History:  Procedure Laterality Date   ANKLE SURGERY Right 2011   [place]   WRIST SURGERY Right 2016    Social History  reports that she quit smoking about 24 years ago. She has never used smokeless tobacco. She reports current alcohol use. She reports that she does not use drugs.  Allergies  Allergen Reactions   Latex Itching, Swelling, Rash and Other (See Comments)    Eats through skin; blister    Adhesive [Tape] Other (See Comments)    Plastic tape eats through skin - pls use paper tape    Family History  Problem Relation Age of Onset   Bone cancer Mother    Esophageal cancer Father   Reviewed on admission  Prior to Admission medications   Medication Sig Start Date End Date Taking? Authorizing Provider  baclofen (LIORESAL) 20 MG tablet Take 20 mg by  mouth as needed for muscle spasms.   Yes [provider]  DULoxetine (CYMBALTA) 60 MG capsule Take 1 capsule (60 mg total) by mouth daily. 03/27/21  Yes Marcial Pacas, MD  folic acid (FOLVITE) 1 MG tablet Take 1 tablet (1 mg total) by mouth daily. 03/11/21 06/09/21 Yes Pokhrel, Laxman, MD  gabapentin (NEURONTIN) 100 MG capsule Take 1 capsule (100 mg total) by mouth 3 (three) times daily. 03/27/21  Yes Marcial Pacas, MD  HYDROcodone-acetaminophen (NORCO/VICODIN) 5-325 MG tablet Take 1 tablet by mouth every 6 (six) hours as needed (pain). 03/11/21 03/11/22 Yes Pokhrel, Laxman, MD  Multiple Vitamin (MULTIVITAMIN WITH MINERALS) TABS tablet Take 1 tablet  by mouth daily. 03/11/21 03/11/22 Yes Pokhrel, Laxman, MD  naproxen sodium (ALEVE) 220 MG tablet Take 220 mg by mouth.   Yes [provider]  Omega-3 Fatty Acids (FISH OIL PO) Take 1 capsule by mouth daily.   Yes [provider]  thiamine 100 MG tablet Take 1 tablet (100 mg total) by mouth daily. 03/11/21 06/09/21 Yes Pokhrel, Laxman, MD  Vitamin D, Cholecalciferol, 25 MCG (1000 UT) TABS Take 1 tablet by mouth daily.   Yes [provider]  acetaminophen (TYLENOL) 325 MG tablet Take 2 tablets (650 mg total) by mouth every 6 (six) hours as needed for mild pain or headache. Patient not taking: No sig reported 03/11/21 03/11/22  Pokhrel, Corrie Mckusick, MD  ondansetron (ZOFRAN ODT) 4 MG disintegrating tablet Take 1 tablet (4 mg total) by mouth every 8 (eight) hours as needed for nausea or vomiting. Patient not taking: No sig reported 02/25/21   Volanda Napoleon, PA-C    Physical Exam: Vitals:   04/03/21 1847 04/03/21 2129 04/03/21 2215 04/03/21 2300  BP: (!) 145/85 (!) 144/76 (!) 141/77 (!) 141/77  Pulse: 88 81 80 90  Resp: 17 18 (!) 25 (!) 21  Temp: 98.7 F (37.1 C)     TempSrc: Oral     SpO2: 100% 98% 99% 96%   Physical Exam Constitutional:      General: She is not in acute distress.    Appearance: Normal appearance. She is ill-appearing.  HENT:     Head: Normocephalic and atraumatic.     Mouth/Throat:     Mouth: Mucous membranes are moist.     Pharynx: Oropharynx is clear.  Eyes:     Extraocular Movements: Extraocular movements intact.     Pupils: Pupils are equal, round, and reactive to light.  Cardiovascular:     Rate and Rhythm: Normal rate and regular rhythm.     Pulses: Normal pulses.     Heart sounds: Normal heart sounds.  Pulmonary:     Effort: Pulmonary effort is normal. No respiratory distress.     Breath sounds: Normal breath sounds.  Abdominal:     General: Bowel sounds are normal. There is no distension.     Palpations: Abdomen is soft.     Tenderness:  There is no abdominal tenderness.  Musculoskeletal:        General: No swelling or deformity.  Skin:    General: Skin is warm and dry.  Neurological:     General: No focal deficit present.     Mental Status: Mental status is at baseline.     Motor: Weakness (generalized) present.    Labs on Admission: I have personally reviewed following labs and imaging studies  CBC: Recent Labs  Lab 04/03/21 1849  WBC 3.5*  NEUTROABS 2.1  HGB 9.6*  HCT 29.7*  MCV  99.0  PLT 145*    Basic Metabolic Panel: Recent Labs  Lab 04/03/21 1849  NA 134*  K 3.1*  CL 100  CO2 25  GLUCOSE 104*  BUN 12  CREATININE 0.67  CALCIUM 8.6*    GFR: CrCl cannot be calculated (Unknown ideal weight.).  Liver Function Tests: No results for input(s): AST, ALT, ALKPHOS, BILITOT, PROT, ALBUMIN in the last 168 hours.  Urine analysis:    Component Value Date/Time   COLORURINE AMBER (A) 04/03/2021 2127   APPEARANCEUR CLEAR 04/03/2021 2127   LABSPEC 1.018 04/03/2021 2127   PHURINE 5.0 04/03/2021 2127   GLUCOSEU NEGATIVE 04/03/2021 2127   HGBUR NEGATIVE 04/03/2021 2127   BILIRUBINUR NEGATIVE 04/03/2021 2127   KETONESUR NEGATIVE 04/03/2021 2127   PROTEINUR 30 (A) 04/03/2021 2127   NITRITE NEGATIVE 04/03/2021 2127   LEUKOCYTESUR NEGATIVE 04/03/2021 2127    Radiological Exams on Admission: DG Chest Portable 1 View  Result Date: 04/03/2021 CLINICAL DATA:  Generalized weakness. EXAM: PORTABLE CHEST 1 VIEW COMPARISON:  March 06, 2021 FINDINGS: The heart size and mediastinal contours are within normal limits. Both lungs are clear. Radiopaque fixation plate and screws are seen within the proximal left humerus. Degenerative changes seen throughout the thoracic spine. IMPRESSION: No active cardiopulmonary disease. Electronically Signed   By: Virgina Norfolk M.D.   On: 04/03/2021 22:26    EKG: Independently reviewed.  Sinus rhythm at 83 bpm.  Nonspecific ST changes in aVL.  Assessment/Plan Principal  Problem:   Acute COVID-19 Active Problems:   Chronic hepatitis C without hepatic coma (HCC)   Gait abnormality   Hypokalemia   Other cirrhosis of liver (HCC)   Cerebellar ataxia in diseases classified elsewhere (HCC)   Chronic low back pain   Polysubstance abuse (HCC)   Weakness  Acute COVID-19 > Patient with acute on chronic weakness and exposure to COVID about 6 days ago. > Noted to have white count of 3.5.  Chest x-ray without acute abnormality however not requiring any oxygen. > When this result returned inflammatory markers were ordered in the ED > Believe this is most likely trigger for her acute on chronic weakness, see below. - Follow-up results of inflammatory markers - Outside window for high risk asymptomatic treatment - PT OT eval and treat for weakness  Acute on chronic generalized weakness Cerebellar ataxia with gait abnormality Rhabdomyolysis > Patient is coming in with acute on chronic weakness including difficulty getting around homes as she has been alone for last few days.  Suspect that this is largely due to COVID-19 exposure and now a diagnosis. > We will work-up anemia as below > Noted to have mild rhabdomyolysis with CK 5880. - Work-up anemia - Continue with 1 L IV bolus and continuous IV fluids - Trend lactic acid - Check TSH for completeness  Anemia History of MGUS > Anemia may be contributing to her weakness in addition to viral illness. > She was noted to be anemic to 9.6 from baseline of 12-13, she has had recent shoulder surgery and may have had some blood loss with that and she may also be having some marrow suppression with her COVID-19.  She does have history of MGUS that was thought likely not to progress and her calcium is near normal so we will continue to monitor. - Check iron, ferritin, TIBC, folate, B12 - Add on LFTs to evaluate T bili  Thrombocytopenia Cirrhosis > History of cirrhosis with thrombocytopenia currently at 145. > Etiology  alcoholic versus history of hep  C - Not currently on any medications for this  Hypokalemia > Definitely be 3.1 in the ED. - Replete with 40 mEq p.o. twice daily x2 - Check magnesium - Trend Potassium  Fibromyalgia Depression Chronic low back pain - Continue home duloxetine  DVT prophylaxis: Lovenox  Code Status:   Full  Family Communication:  Daughter updated by phone. Disposition Plan:   Patient is from:  Home  Anticipated DC to:  Pending clinical course  Anticipated DC date:  1 to 3 days  Anticipated DC barriers: None  Consults called:  None  Admission status:  Observation, telemetry   Severity of Illness: The appropriate patient status for this patient is OBSERVATION. Observation status is judged to be reasonable and necessary in order to provide the required intensity of service to ensure the patient's safety. The patient's presenting symptoms, physical exam findings, and initial radiographic and laboratory data in the context of their medical condition is felt to place them at decreased risk for further clinical deterioration. Furthermore, it is anticipated that the patient will be medically stable for discharge from the hospital within 2 midnights of admission. The following factors support the patient status of observation.   " The patient's presenting symptoms include weakness. " The physical exam findings include generalized weakness. " The initial radiographic and laboratory data are potassium 3.1, hemoglobin 9.6, WBC 3.5, platelets 145, calcium 8.6, CK 5800, normal urinalysis, negative FOBT, respiratory panel positive for COVID.  Marcelyn Bruins MD [facility]  How to contact the [facility] Attending or Consulting provider [place] or covering provider during after hours [place], for this patient?   Check the care team in [facility] and look for a) attending/consulting [facility] provider listed and b) the [facility] team listed Log into [email or web address] and use [place]'s universal  password to access. If you do not have the password, please contact the hospital operator. Locate the [facility] provider you are looking for under [facility] and page to a number that you can be directly reached. If you still have difficulty reaching the provider, please page the [facility] (Director on Call) for the Hospitalists listed on amion for assistance.  04/03/2021, 11:55 PM

## 2021-04-03 NOTE — ED Triage Notes (Signed)
Pt BIB [facility] from home with general weakness, pt slipped down onto the floor, denies hitting her head, denies LOC. Pt with weakness since recent surgery. Pt was released from [place], daughter states she can not be left alone and requesting a covid test as daughter is taking care of her mother and will not wear a mask, approached EMS w/o a mask on telling them she was covid + and has been taking care oh her mother.    BP 124/90 HR 90 RR 18 97% RA  CBG 138 100.6

## 2021-04-04 DIAGNOSIS — E876 Hypokalemia: Secondary | ICD-10-CM | POA: Diagnosis present

## 2021-04-04 DIAGNOSIS — R42 Dizziness and giddiness: Secondary | ICD-10-CM | POA: Diagnosis not present

## 2021-04-04 DIAGNOSIS — R531 Weakness: Secondary | ICD-10-CM | POA: Diagnosis not present

## 2021-04-04 DIAGNOSIS — K7469 Other cirrhosis of liver: Secondary | ICD-10-CM | POA: Diagnosis present

## 2021-04-04 DIAGNOSIS — Z91048 Other nonmedicinal substance allergy status: Secondary | ICD-10-CM | POA: Diagnosis not present

## 2021-04-04 DIAGNOSIS — D6959 Other secondary thrombocytopenia: Secondary | ICD-10-CM | POA: Diagnosis present

## 2021-04-04 DIAGNOSIS — Z66 Do not resuscitate: Secondary | ICD-10-CM | POA: Diagnosis present

## 2021-04-04 DIAGNOSIS — R748 Abnormal levels of other serum enzymes: Secondary | ICD-10-CM | POA: Diagnosis present

## 2021-04-04 DIAGNOSIS — D709 Neutropenia, unspecified: Secondary | ICD-10-CM | POA: Diagnosis present

## 2021-04-04 DIAGNOSIS — G8929 Other chronic pain: Secondary | ICD-10-CM | POA: Diagnosis present

## 2021-04-04 DIAGNOSIS — M545 Low back pain, unspecified: Secondary | ICD-10-CM | POA: Diagnosis present

## 2021-04-04 DIAGNOSIS — Z91040 Latex allergy status: Secondary | ICD-10-CM | POA: Diagnosis not present

## 2021-04-04 DIAGNOSIS — G43909 Migraine, unspecified, not intractable, without status migrainosus: Secondary | ICD-10-CM | POA: Diagnosis present

## 2021-04-04 DIAGNOSIS — E871 Hypo-osmolality and hyponatremia: Secondary | ICD-10-CM | POA: Diagnosis present

## 2021-04-04 DIAGNOSIS — D472 Monoclonal gammopathy: Secondary | ICD-10-CM | POA: Diagnosis present

## 2021-04-04 DIAGNOSIS — F32A Depression, unspecified: Secondary | ICD-10-CM | POA: Diagnosis present

## 2021-04-04 DIAGNOSIS — U071 COVID-19: Secondary | ICD-10-CM | POA: Diagnosis not present

## 2021-04-04 DIAGNOSIS — D638 Anemia in other chronic diseases classified elsewhere: Secondary | ICD-10-CM | POA: Diagnosis present

## 2021-04-04 DIAGNOSIS — R296 Repeated falls: Secondary | ICD-10-CM | POA: Diagnosis present

## 2021-04-04 DIAGNOSIS — M797 Fibromyalgia: Secondary | ICD-10-CM | POA: Diagnosis present

## 2021-04-04 DIAGNOSIS — B182 Chronic viral hepatitis C: Secondary | ICD-10-CM | POA: Diagnosis present

## 2021-04-04 DIAGNOSIS — M6282 Rhabdomyolysis: Secondary | ICD-10-CM | POA: Diagnosis present

## 2021-04-04 DIAGNOSIS — Z87891 Personal history of nicotine dependence: Secondary | ICD-10-CM | POA: Diagnosis not present

## 2021-04-04 DIAGNOSIS — G119 Hereditary ataxia, unspecified: Secondary | ICD-10-CM | POA: Diagnosis present

## 2021-04-04 DIAGNOSIS — Z79899 Other long term (current) drug therapy: Secondary | ICD-10-CM | POA: Diagnosis not present

## 2021-04-04 LAB — COMPREHENSIVE METABOLIC PANEL
ALT: 52 U/L — ABNORMAL HIGH (ref 0–44)
AST: 165 U/L — ABNORMAL HIGH (ref 15–41)
Albumin: 2.7 g/dL — ABNORMAL LOW (ref 3.5–5.0)
Alkaline Phosphatase: 117 U/L (ref 38–126)
Anion gap: 4 — ABNORMAL LOW (ref 5–15)
BUN: 9 mg/dL (ref 8–23)
CO2: 25 mmol/L (ref 22–32)
Calcium: 7.8 mg/dL — ABNORMAL LOW (ref 8.9–10.3)
Chloride: 109 mmol/L (ref 98–111)
Creatinine, Ser: 0.57 mg/dL (ref 0.44–1.00)
GFR, Estimated: >60 mL/min (ref >60)
Glucose, Bld: 99 mg/dL (ref 70–99)
Potassium: 3.2 mmol/L — ABNORMAL LOW (ref 3.5–5.1)
Sodium: 138 mmol/L (ref 135–145)
Total Bilirubin: 1.3 mg/dL — ABNORMAL HIGH (ref 0.3–1.2)
Total Protein: 5.7 g/dL — ABNORMAL LOW (ref 6.5–8.1)

## 2021-04-04 LAB — CBC
HCT: 27.6 % — ABNORMAL LOW (ref 36.0–46.0)
Hemoglobin: 8.8 g/dL — ABNORMAL LOW (ref 12.0–15.0)
MCH: 32 pg (ref 26.0–34.0)
MCHC: 31.9 g/dL (ref 30.0–36.0)
MCV: 100.4 fL — ABNORMAL HIGH (ref 80.0–100.0)
Platelets: 102 10*3/uL — ABNORMAL LOW (ref 150–400)
RBC: 2.75 MIL/uL — ABNORMAL LOW (ref 3.87–5.11)
RDW: 14.6 % (ref 11.5–15.5)
WBC: 2.2 10*3/uL — ABNORMAL LOW (ref 4.0–10.5)
nRBC: 0 % (ref 0.0–0.2)

## 2021-04-04 LAB — FIBRINOGEN: Fibrinogen: 327 mg/dL (ref 210–475)

## 2021-04-04 LAB — HEPATIC FUNCTION PANEL
ALT: 51 U/L — ABNORMAL HIGH (ref 0–44)
AST: 165 U/L — ABNORMAL HIGH (ref 15–41)
Albumin: 2.8 g/dL — ABNORMAL LOW (ref 3.5–5.0)
Alkaline Phosphatase: 122 U/L (ref 38–126)
Bilirubin, Direct: 0.4 mg/dL — ABNORMAL HIGH (ref 0.0–0.2)
Indirect Bilirubin: 0.8 mg/dL (ref 0.3–0.9)
Total Bilirubin: 1.2 mg/dL (ref 0.3–1.2)
Total Protein: 6.1 g/dL — ABNORMAL LOW (ref 6.5–8.1)

## 2021-04-04 LAB — PROCALCITONIN: Procalcitonin: <0.1 ng/mL

## 2021-04-04 LAB — LACTIC ACID, PLASMA: Lactic Acid, Venous: 1 mmol/L (ref 0.5–1.9)

## 2021-04-04 LAB — TRIGLYCERIDES: Triglycerides: 49 mg/dL (ref <150)

## 2021-04-04 LAB — LACTATE DEHYDROGENASE: LDH: 293 U/L — ABNORMAL HIGH (ref 98–192)

## 2021-04-04 LAB — PROTIME-INR
INR: 1.2 (ref 0.8–1.2)
Prothrombin Time: 15.3 seconds — ABNORMAL HIGH (ref 11.4–15.2)

## 2021-04-04 LAB — FERRITIN: Ferritin: 327 ng/mL — ABNORMAL HIGH (ref 11–307)

## 2021-04-04 LAB — C-REACTIVE PROTEIN: CRP: 0.8 mg/dL (ref <1.0)

## 2021-04-04 LAB — D-DIMER, QUANTITATIVE: D-Dimer, Quant: 3.51 ug/mL-FEU — ABNORMAL HIGH (ref 0.00–0.50)

## 2021-04-04 LAB — TSH: TSH: 2.464 u[IU]/mL (ref 0.350–4.500)

## 2021-04-04 LAB — CK: Total CK: 4532 U/L — ABNORMAL HIGH (ref 38–234)

## 2021-04-04 LAB — MAGNESIUM: Magnesium: 1.8 mg/dL (ref 1.7–2.4)

## 2021-04-04 MED ORDER — HYDROCODONE-ACETAMINOPHEN 5-325 MG PO TABS
1.0000 | ORAL_TABLET | Freq: Four times a day (QID) | ORAL | Status: DC | PRN
  Administered 2021-04-04 – 2021-04-07 (×6): 1 via ORAL
  Filled 2021-04-04 (×6): qty 1

## 2021-04-04 MED ORDER — LACTATED RINGERS IV SOLN: INTRAVENOUS | Status: DC

## 2021-04-04 MED ORDER — BACLOFEN 10 MG PO TABS
20.0000 mg | ORAL_TABLET | Freq: Every day | ORAL | Status: DC | PRN
  Administered 2021-04-04 – 2021-04-06 (×4): 20 mg via ORAL
  Filled 2021-04-04 (×4): qty 2

## 2021-04-04 NOTE — Plan of Care (Signed)

## 2021-04-04 NOTE — Evaluation (Signed)
Physical Therapy Evaluation Patient Details Name: Kelly Singh MRN: [id number] DOB: 1956/02/09 Today's Date: 04/04/2021   History of Present Illness  Pt is a 65 y/o female admitted 6/29 secondary to increased weakness. Reports she was unable to stand from toilet for prolonged period and ended up on the floor. Recent L shoulder surgery as well. PMH includes fibromyalgia, ataxia, and alcohol abuse.  Clinical Impression  Pt admitted secondary to problem above with deficits below. Pt limited secondary to increased pain in R groin, and neck. Required mod A to perform bed mobility tasks, but further mobility deferred secondary to pain. Was able to tolerate cervical ROM activities in sitting. Pt reports she currently lives alone and is at increased risk for falls. Recommending SNF level therapies at d/c to increase independence and safety; would likely benefit from ALF setting long term. Will continue to follow acutely.     Follow Up Recommendations SNF;Supervision/Assistance - 24 hour    Equipment Recommendations  Wheelchair (measurements PT);Wheelchair cushion (measurements PT)    Recommendations for Other Services       Precautions / Restrictions Precautions Precautions: Fall Restrictions Weight Bearing Restrictions: No      Mobility  Bed Mobility Overal bed mobility: Needs Assistance Bed Mobility: Supine to Sit;Sit to Supine     Supine to sit: Mod assist Sit to supine: Mod assist   General bed mobility comments: Mod A for trunk assist to come to sitting. Pt moaning in pain secondary to pain in R groin area and in neck. Was able to perform neck ROM activities, but then returned to supine secondary to poor tolerance.    Transfers                    Ambulation/Gait                Stairs            Wheelchair Mobility    Modified Rankin (Stroke Patients Only)       Balance Overall balance assessment: Needs assistance Sitting-balance support: No  upper extremity supported;Feet supported Sitting balance-Leahy Scale: Fair                                       Pertinent Vitals/Pain Pain Assessment: Faces Faces Pain Scale: Hurts even more Pain Location: R groin, R shoulder, and neck Pain Descriptors / Indicators: Aching;Operative site guarding Pain Intervention(s): Limited activity within patient's tolerance;Monitored during session;Repositioned    Home Living Family/patient expects to be discharged to:: Inpatient rehab Living Arrangements: Alone Available Help at Discharge: Family;Available PRN/intermittently Type of Home: Apartment Home Access: Level entry     Home Layout: One level Home Equipment: Bedside commode      Prior Function Level of Independence: Needs assistance   Gait / Transfers Assistance Needed: Pt reports she has mostly been in the bed since being home. per previous notes, used walking stick for mobility           Hand Dominance        Extremity/Trunk Assessment   Upper Extremity Assessment Upper Extremity Assessment: Defer to OT evaluation (LUE in sling)    Lower Extremity Assessment Lower Extremity Assessment: Generalized weakness (atxia at baseline)    Cervical / Trunk Assessment Cervical / Trunk Assessment: Kyphotic;Other exceptions (forward head)  Communication   Communication: No difficulties  Cognition Arousal/Alertness: Awake/alert Behavior During Therapy: Restless Overall Cognitive Status: No family/caregiver  present to determine baseline cognitive functioning                                 General Comments: Pt very tangential throughout. Decreased safety awareness and awareness of deficits.      General Comments      Exercises Other Exercises Other Exercises: Chin tucks X8, neck horizontal rotation X5, and flexion/extension at neck X5.   Assessment/Plan    PT Assessment Patient needs continued PT services  PT Problem List Decreased  strength;Decreased balance;Decreased activity tolerance;Decreased mobility;Decreased knowledge of use of DME;Decreased safety awareness;Decreased knowledge of precautions;Decreased cognition;Decreased coordination       PT Treatment Interventions Gait training;DME instruction;Functional mobility training;Therapeutic activities;Balance training;Therapeutic exercise;Cognitive remediation;Patient/family education    PT Goals (Current goals can be found in the Care Plan section)  Acute Rehab PT Goals Patient Stated Goal: to decrease pain PT Goal Formulation: With patient Time For Goal Achievement: 04/18/21 Potential to Achieve Goals: Good    Frequency Min 2X/week   Barriers to discharge Decreased caregiver support      Co-evaluation               AM-PAC PT "6 Clicks" Mobility  Outcome Measure Help needed turning from your back to your side while in a flat bed without using bedrails?: A Little Help needed moving from lying on your back to sitting on the side of a flat bed without using bedrails?: A Lot Help needed moving to and from a bed to a chair (including a wheelchair)?: A Lot Help needed standing up from a chair using your arms (e.g., wheelchair or bedside chair)?: A Lot Help needed to walk in hospital room?: A Lot Help needed climbing 3-5 steps with a railing? : A Lot 6 Click Score: 13    End of Session Equipment Utilized During Treatment: Other (comment) (sling) Activity Tolerance: Patient limited by pain Patient left: in bed;with call bell/phone within reach (on stretcher in ED) Nurse Communication: Mobility status PT Visit Diagnosis: Unsteadiness on feet (R26.81);Muscle weakness (generalized) (M62.81);History of falling (Z91.81)    Time: 8185-6314 PT Time Calculation (min) (ACUTE ONLY): 17 min   Charges:   PT Evaluation $PT Eval Moderate Complexity: 1 Mod          Reuel Derby, PT, DPT  Acute Rehabilitation Services  Pager: [phone] Office: [phone]   Rudean Hitt 04/04/2021, 11:51 AM

## 2021-04-04 NOTE — Progress Notes (Signed)
[facility] Progress Note  Patient: Kelly Singh    ZSW:[id number]  DOA: 04/03/2021     Date of Service: the patient was seen and examined on 04/04/2021  Brief hospital course: Possible history of alcohol use, substance use, ataxia, chronic low back, cirrhosis, fibromyalgia, depression, MGUS.  Presents with complaints of generalized fatigue.  Was on the ground for 12 hours after a near fall. Found to have COVID-19 infection and rhabdomyolysis. Currently plan is IV hydration and monitor.  Subjective: No nausea no vomiting.  No fever no chills.  No chest pain abdominal pain.  No leg pain.  No diarrhea no constipation. Reports fatigue and tiredness. Does not want any treatment for COVID-19 infection.  Assessment and Plan: 1.  Acute generalized weakness. Fall. Cerebral ataxia chronic, with gait abnormality. Acute rhabdomyolysis nontraumatic. CK elevated in the 5000 range. Will give IV fluids. Patient has chronic cerebellar ataxia and has recurrent fall Not safe to live at home alone. PT OT recommends SNF.  Will require long-term placement. Family currently working on ALF  2.  COVID-19 infection. Contributing to patient's presentation with generalized fatigue and weakness. Chest x-ray clear.  On room air. CRP normal. Patient does not want any therapy for COVID-19 infection. Monitor.  3.  History of MGUS. Anemia H&H stable. No active bleeding. Monitor.  MGUS placing the patient at high risk of poor outcome secondary to infection.  4.  History of cirrhosis. Thrombocytopenia.  Chronic. Platelet count stable but Monitor.  5.  Hypokalemia Replacing.  6.  Fibromyalgia, depression, chronic low back pain. Continuing Cymbalta. Also continue gabapentin.  Scheduled Meds:  DULoxetine  60 mg Oral Daily   enoxaparin (LOVENOX) injection  40 mg Subcutaneous Daily   folic acid  1 mg Oral Daily   gabapentin  100 mg Oral TID   sodium chloride flush  3 mL Intravenous Q12H    thiamine  100 mg Oral Daily   Continuous Infusions:  lactated ringers 100 mL/hr at 04/04/21 1340   PRN Meds: acetaminophen **OR** acetaminophen, baclofen, HYDROcodone-acetaminophen, polyethylene glycol  Body mass index is 21.07 kg/m.        DVT Prophylaxis:   enoxaparin (LOVENOX) injection 40 mg Start: 04/04/21 1000    Advance goals of care discussion: Pt is DNR.  Patient would like her daughter to help with the decision but she does not want to be resuscitated.  He understands that that will mean the end of life.  Family Communication: no family was present at bedside, at the time of interview.   Data Reviewed: I have personally reviewed and interpreted daily labs, tele strips, imaging. Mild hypokalemia.  Hyponatremia improving.  Hyperkalemia.  LFT remains elevated and stable.  CK improving from 5800-4500.  TSH normal.  D-dimer mildly elevated.  Monitored in the setting of COVID-19 infection as well as fall.  Physical Exam:  General: Appear in mild distress, no Rash; Oral Mucosa Clear, moist. no Abnormal Neck Mass Or lumps, Conjunctiva normal  Cardiovascular: S1 and S2 Present, no Murmur, Respiratory: good respiratory effort, Bilateral Air entry present and CTA, no Crackles, no wheezes Abdomen: Bowel Sound present, Soft and no tenderness Extremities: no Pedal edema left shoulder in sling. Neurology: alert and oriented to time, place, and person affect appropriate. no new focal deficit Gait not checked due to patient safety concerns  Vitals:   04/04/21 0830 04/04/21 0900 04/04/21 0930 04/04/21 1043  BP: 128/67 (!) 144/73 128/78 124/69  Pulse: 71 73 84 71  Resp: (!) 21 (!) 21 16  20  Temp:    98 F (36.7 C)  TempSrc:    Oral  SpO2: 98% 98% 99% 100%  Weight:    59.2 kg  Height:    5\' 6"  (1.676 m)    Disposition:  Status is: Inpatient  Remains inpatient appropriate because:Unsafe d/c plan  Dispo: The patient is from: Home              Anticipated d/c is to: ALF               Patient currently is not medically stable to d/c.   Difficult to place patient No        Time spent: 35 minutes. I reviewed all nursing notes, pharmacy notes, vitals, pertinent old records. I have discussed plan of care as described above with RN.  Author: Berle Mull, MD [facility] 04/04/2021 8:04 PM  To reach On-call, see care teams to locate the attending and reach out via [email or web address]. Between 7PM-7AM, please contact night-coverage If you still have difficulty reaching the attending provider, please page the [facility] (Director on Call) for [facility] on amion for assistance.

## 2021-04-05 LAB — DIFFERENTIAL
Abs Immature Granulocytes: 0.01 10*3/uL (ref 0.00–0.07)
Basophils Absolute: 0 10*3/uL (ref 0.0–0.1)
Basophils Relative: 0 %
Eosinophils Absolute: 0 10*3/uL (ref 0.0–0.5)
Eosinophils Relative: 2 %
Immature Granulocytes: 1 %
Lymphocytes Relative: 52 %
Lymphs Abs: 0.6 10*3/uL — ABNORMAL LOW (ref 0.7–4.0)
Monocytes Absolute: 0.2 10*3/uL (ref 0.1–1.0)
Monocytes Relative: 17 %
Neutro Abs: 0.3 10*3/uL — CL (ref 1.7–7.7)
Neutrophils Relative %: 28 %

## 2021-04-05 LAB — CBC
HCT: 26.9 % — ABNORMAL LOW (ref 36.0–46.0)
HCT: 26.6 % — ABNORMAL LOW (ref 36.0–46.0)
Hemoglobin: 8.9 g/dL — ABNORMAL LOW (ref 12.0–15.0)
Hemoglobin: 8.6 g/dL — ABNORMAL LOW (ref 12.0–15.0)
MCH: 32.7 pg (ref 26.0–34.0)
MCH: 32.5 pg (ref 26.0–34.0)
MCHC: 33.1 g/dL (ref 30.0–36.0)
MCHC: 32.3 g/dL (ref 30.0–36.0)
MCV: 98.9 fL (ref 80.0–100.0)
MCV: 100.4 fL — ABNORMAL HIGH (ref 80.0–100.0)
Platelets: 98 10*3/uL — ABNORMAL LOW (ref 150–400)
Platelets: 94 10*3/uL — ABNORMAL LOW (ref 150–400)
RBC: 2.72 MIL/uL — ABNORMAL LOW (ref 3.87–5.11)
RBC: 2.65 MIL/uL — ABNORMAL LOW (ref 3.87–5.11)
RDW: 15.3 % (ref 11.5–15.5)
RDW: 15.2 % (ref 11.5–15.5)
WBC: 1.3 10*3/uL — CL (ref 4.0–10.5)
WBC: 1.2 10*3/uL — CL (ref 4.0–10.5)
nRBC: 0 % (ref 0.0–0.2)
nRBC: 0 % (ref 0.0–0.2)

## 2021-04-05 LAB — BASIC METABOLIC PANEL
Anion gap: 6 (ref 5–15)
BUN: <5 mg/dL — ABNORMAL LOW (ref 8–23)
CO2: 25 mmol/L (ref 22–32)
Calcium: 7.6 mg/dL — ABNORMAL LOW (ref 8.9–10.3)
Chloride: 105 mmol/L (ref 98–111)
Creatinine, Ser: 0.45 mg/dL (ref 0.44–1.00)
GFR, Estimated: >60 mL/min (ref >60)
Glucose, Bld: 98 mg/dL (ref 70–99)
Potassium: 3.1 mmol/L — ABNORMAL LOW (ref 3.5–5.1)
Sodium: 136 mmol/L (ref 135–145)

## 2021-04-05 LAB — SEDIMENTATION RATE: Sed Rate: 2 mm/hr (ref 0–40)

## 2021-04-05 LAB — FOLATE: Folate: 14.9 ng/mL (ref >3.0)

## 2021-04-05 LAB — TECHNOLOGIST SMEAR REVIEW: Plt Morphology: DECREASED

## 2021-04-05 LAB — VITAMIN B12: Vitamin B-12: 446 pg/mL (ref 232–1245)

## 2021-04-05 LAB — TSH: TSH: 3.46 u[IU]/mL (ref 0.450–4.500)

## 2021-04-05 LAB — ANA W/REFLEX: ANA Titer 1: NEGATIVE

## 2021-04-05 LAB — C-REACTIVE PROTEIN
CRP: <1 mg/L (ref 0–10)
CRP: 0.8 mg/dL (ref <1.0)

## 2021-04-05 LAB — CK
Total CK: 37 U/L (ref 32–182)
Total CK: 1466 U/L — ABNORMAL HIGH (ref 38–234)

## 2021-04-05 LAB — COPPER, SERUM: Copper: 84 ug/dL (ref 80–158)

## 2021-04-05 LAB — FERRITIN: Ferritin: 111 ng/mL (ref 15–150)

## 2021-04-05 LAB — VITAMIN D 25 HYDROXY (VIT D DEFICIENCY, FRACTURES): Vit D, 25-Hydroxy: 34 ng/mL (ref 30.0–100.0)

## 2021-04-05 MED ORDER — ALBUTEROL SULFATE HFA 108 (90 BASE) MCG/ACT IN AERS
2.0000 | INHALATION_SPRAY | Freq: Four times a day (QID) | RESPIRATORY_TRACT | Status: DC
  Administered 2021-04-05 – 2021-04-07 (×5): 2 via RESPIRATORY_TRACT
  Filled 2021-04-05: qty 6.7

## 2021-04-05 MED ORDER — ALBUTEROL SULFATE HFA 108 (90 BASE) MCG/ACT IN AERS: 2.0000 | INHALATION_SPRAY | Freq: Four times a day (QID) | RESPIRATORY_TRACT | Status: DC

## 2021-04-05 MED ORDER — DM-GUAIFENESIN ER 30-600 MG PO TB12
1.0000 | ORAL_TABLET | Freq: Two times a day (BID) | ORAL | Status: DC
  Administered 2021-04-05 – 2021-04-07 (×4): 1 via ORAL
  Filled 2021-04-05 (×4): qty 1

## 2021-04-05 NOTE — Progress Notes (Signed)
[facility] Progress Note  Patient: Kelly Singh    XNT:[id number]  DOA: 04/03/2021     Date of Service: the patient was seen and examined on 04/05/2021  Brief hospital course: Past medical history of alcohol use, substance use, ataxia, chronic low back pain, cirrhosis, fibromyalgia, depression, MGUS.  Presents with complaint of generalized fatigue. Was on the ground for 12 hours after a fall.  Had rhabdomyolysis on admission. Also incidentally found to have COVID-19 infection. Currently plan is monitor renal function.  Subjective: Feeling better.  Some back muscles tightness.  No nausea no vomiting.  No leg pain.  No fever no chills.  No cough or shortness of breath.   Assessment and Plan: 1.  Acute nontraumatic rhabdomyolysis. Acute generalized weakness and fall. 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 CK improving. Will reduce IV fluid rate. Patient has chronic cerebellar ataxia as well as recurrent fall and not safe to live alone. PT OT recommends SNF. Social worker working on the case.  Eventually plans to go home.  2.  COVID-19 infection Exposure to daughter with COVID-19 6 days ago admission. Contributing to patient generalized weakness and fatigue. Chest x-ray clear.  Currently on room air. Examination without any respiratory crackles or distress as well. Patient does not believe in any therapy for COVID-19 infection and does not want any therapy for now.  3.  History of MGUS and associated anemia. Chronic thrombocytopenia Neutropenia Neutropenia likely secondary to COVID-19 infection. Thrombocytopenia likely secondary to cirrhosis of the liver history. Patient has chronic anemia no acute bleeding. Associated with MGUS.  4.  Liver cirrhosis Does not appear to have any significant decompensation. Monitor.  5.  Hypokalemia Replaced.  Monitor.  6.  Fibromyalgia, depression Continue Cymbalta. Continue gabapentin.  Scheduled  Meds:  DULoxetine  60 mg Oral Daily   enoxaparin (LOVENOX) injection  40 mg Subcutaneous Daily   folic acid  1 mg Oral Daily   gabapentin  100 mg Oral TID   sodium chloride flush  3 mL Intravenous Q12H   thiamine  100 mg Oral Daily   Continuous Infusions:  lactated ringers 50 mL/hr at 04/05/21 1449   PRN Meds: acetaminophen **OR** acetaminophen, baclofen, HYDROcodone-acetaminophen, polyethylene glycol  Body mass index is 21.07 kg/m.        DVT Prophylaxis:   enoxaparin (LOVENOX) injection 40 mg Start: 04/04/21 1000    Advance goals of care discussion: Pt is DNR.  Family Communication: no family was present at bedside, at the time of interview.   Data Reviewed: I have personally reviewed and interpreted daily labs, tele strips, imaging. Potassium 3.1.  CK improving from 4500-1400. WBC 1.2.  Neutrophil count 0.3.  Morphology appears normal on smear  Physical Exam:  General: Appear in mild distress, no Rash; Oral Mucosa Clear, moist. no Abnormal Neck Mass Or lumps, Conjunctiva normal  Cardiovascular: S1 and S2 Present, no Murmur, Respiratory: good respiratory effort, Bilateral Air entry present and CTA, coarse breath sound, no crackles, no wheezes Abdomen: Bowel Sound present, Soft and no tenderness Extremities: no Pedal edema Neurology: alert and oriented to time, place, and person affect appropriate. no new focal deficit Gait not checked due to patient safety concerns  Vitals:   04/04/21 1043 04/04/21 2017 04/05/21 0647 04/05/21 1304  BP: 124/69 (!) 126/99 119/73 131/76  Pulse: 71 74 63 76  Resp: 20 18 18 18   Temp: 98 F (36.7 C) 98 F (36.7 C) 97.7 F (36.5 C) 98 F (36.7 C)  TempSrc:  Oral Oral Oral Oral  SpO2: 100% 100% 100% 99%  Weight: 59.2 kg     Height: 5\' 6"  (1.676 m)       Disposition:  Status is: Inpatient  Remains inpatient appropriate because:IV treatments appropriate due to intensity of illness or inability to take PO  Dispo: The patient is  from: Home              Anticipated d/c is to: SNF              Patient currently is not medically stable to d/c.   Difficult to place patient No        Time spent: 35 minutes. I reviewed all nursing notes, pharmacy notes, vitals, pertinent old records. I have discussed plan of care as described above with RN.  Author: Berle Mull, MD [facility] 04/05/2021 4:43 PM  To reach On-call, see care teams to locate the attending and reach out via [email or web address]. Between 7PM-7AM, please contact night-coverage If you still have difficulty reaching the attending provider, please page the [facility] (Director on Call) for [facility] on amion for assistance.

## 2021-04-05 NOTE — TOC Initial Note (Addendum)
Transition of Care [facility]) - Initial/Assessment Note    Patient Details  Name: Kelly Singh MRN: [id number] Date of Birth: [birth year]  [facility]) CM/SW Contact:    Kelly Chars, LCSW Phone Number: 04/05/2021, 2:33 PM  Clinical Narrative:  CSW completed assessment by phone as pt is covid positive.  Pt reports that she was recently discharged from [place], she is not sure how long she was there but roughly two weeks.  CSW discussed copay days with return to SNF and she said she could not afford this, would prefer to go home to her daughter's home for assistance.  Pt agreeable to [facility], choice document provided to RN who gave to pt.  Pt normally lives alone in an apartment.  Permission given to speak with daughter [name].  Pt is not vaccinated for covid.  PCP in place.  Current equipment in home: 2 canes.  CSW spoke with pt daughter Kelly Singh who confirms that she and her husband are recovering from covid as well.  Daughter is agreeable to having her mother stay with them with [facility] and states that copays for SNF would be a financial barrier.  Daughter reports that she is still quite weak and is hoping that she will have several more days to recover herself prior to needing to assist her mother.    84:  CSW spoke with daughter again regarding potential ALF admission.  Per daughter, they have placed pt on wait list for ALF but nothing is imminent.  It was also an independent living apartment and they are not sure pt can manage with that level of care anymore.    CSW spoke with [place] at [facility] about whether they would accept a short term covid positive admission. (4 days, which is all the fully covered days pt has left)    She was willing to review a referral to consider a short term admission,  Referral sent in hub.             Expected Discharge Plan: [place] Barriers to Discharge: Continued Medical Work up   Patient Goals and CMS Choice Patient states  their goals for this hospitalization and ongoing recovery are:: " independence and getting back to work" [employer].gov Compare Post Acute Care list provided to:: Patient Choice offered to / list presented to : Patient  Expected Discharge Plan and Services Expected Discharge Plan: [place] Choice: [place] arrangements for the past 2 months: Apartment                                      Prior Living Arrangements/Services Living arrangements for the past 2 months: Apartment Lives with:: Self Patient language and need for interpreter reviewed:: Yes Do you feel safe going back to the place where you live?: No   currently needs more assistance  Need for Family Participation in Patient Care: Yes (Comment) Care giver support system in place?: Yes (comment) Current home services: Other (comment) (none) Criminal Activity/Legal Involvement Pertinent to Current Situation/Hospitalization: No - Comment as needed  Activities of Daily Living Home Assistive Devices/Equipment: Walker (specify type), Cane (specify quad or straight) ADL Screening (condition at time of admission) Patient's cognitive ability adequate to safely complete daily activities?: Yes Is the patient deaf or have difficulty hearing?: No Does the patient have difficulty seeing, even  when wearing glasses/contacts?: No Does the patient have difficulty concentrating, remembering, or making decisions?: No Patient able to express need for assistance with ADLs?: Yes Does the patient have difficulty dressing or bathing?: No Independently performs ADLs?: Yes (appropriate for developmental age) Does the patient have difficulty walking or climbing stairs?: Yes Weakness of Legs: Both Weakness of Arms/Hands: Left  Permission Sought/Granted Permission sought to share information with : Family Supports Permission granted to share information with : Yes, Verbal Permission  Granted  Share Information with NAME: daughter [name]           Emotional Assessment Appearance:: Other (Comment Required (unknown: phone assessment due to pt being covid positive) Attitude/Demeanor/Rapport: Engaged Affect (typically observed): Appropriate, Pleasant Orientation: : Oriented to Self, Oriented to Place, Oriented to  Time, Oriented to Situation Alcohol / Substance Use: Alcohol Use Psych Involvement: No (comment)  Admission diagnosis:  Weakness [R53.1] Elevated CK [R74.8] Acute COVID-19 [U07.1] COVID-19 [U07.1] COVID-19 virus infection [U07.1] Patient Active Problem List   Diagnosis Date Noted   COVID-19 virus infection 04/04/2021   Acute COVID-19 04/03/2021   Weakness 56/31/4970   Acute metabolic encephalopathy 26/37/8588   Closed fracture of humerus, surgical neck 03/06/2021   Polysubstance abuse ([place]) 03/06/2021   Cerebellar ataxia in diseases classified elsewhere ([place]) 09/13/2020   Chronic low back pain 09/13/2020   Slurred speech 03/08/2020   Other cirrhosis of liver ([place]) 12/05/2019   Dizzy 11/09/2019   Fall 11/09/2019   Hypomagnesemia 11/09/2019   Scalp laceration 11/09/2019   Alcohol intoxication ([place]) 11/09/2019   Hypokalemia    Gait abnormality 11/03/2019   Chronic hepatitis C without hepatic coma ([place]) 11/01/2019   PCP:  Kelly Morning, DO Pharmacy:   [facility] [place], [place] AT [facility] [address] [place] [address] Phone: [phone] Fax: [phone]  [place] [address] Phone: [phone] Fax: [phone]     Social Determinants of Health (SDOH) Interventions    Readmission Risk Interventions No flowsheet data found.

## 2021-04-05 NOTE — Progress Notes (Signed)
Critical low white blood cell count of 1.3. MD Posey Pronto paged.

## 2021-04-05 NOTE — NC FL2 (Signed)
[place] LEVEL OF CARE SCREENING TOOL     IDENTIFICATION  Patient Name: Kelly Singh Birthdate: 28-May-1956 Sex: female Admission Date (Current Location): 04/03/2021  [facility] [place] Number:  Herbalist and Address:  [facility] [place] [address], [place], [place] [address]      Provider Number: [id number]  Attending Physician Name and Address:  Lavina Hamman, MD  Relative Name and Phone Number:  Claira, Jeter Daughter   [phone]    Current Level of Care: Hospital Recommended Level of Care: [place] Prior Approval Number:    Date Approved/Denied:   PASRR Number: [id number]  Discharge Plan: SNF    Current Diagnoses: Patient Active Problem List   Diagnosis Date Noted   COVID-19 virus infection 04/04/2021   Acute COVID-19 04/03/2021   Weakness 19/50/9326   Acute metabolic encephalopathy 71/24/5809   Closed fracture of humerus, surgical neck 03/06/2021   Polysubstance abuse ([place]) 03/06/2021   Cerebellar ataxia in diseases classified elsewhere ([place]) 09/13/2020   Chronic low back pain 09/13/2020   Slurred speech 03/08/2020   Other cirrhosis of liver ([place]) 12/05/2019   Dizzy 11/09/2019   Fall 11/09/2019   Hypomagnesemia 11/09/2019   Scalp laceration 11/09/2019   Alcohol intoxication ([place]) 11/09/2019   Hypokalemia    Gait abnormality 11/03/2019   Chronic hepatitis C without hepatic coma ([place]) 11/01/2019    Orientation RESPIRATION BLADDER Height & Weight     Self, Time, Situation, Place  Normal Continent Weight: 130 lb 8.2 oz (59.2 kg) Height:  5\' 6"  (167.6 cm)  BEHAVIORAL SYMPTOMS/MOOD NEUROLOGICAL BOWEL NUTRITION STATUS      Continent Diet (see discharge summary)  AMBULATORY STATUS COMMUNICATION OF NEEDS Skin   Total Care Verbally Surgical wounds                       Personal Care Assistance Level of Assistance  Bathing, Feeding, Dressing Bathing Assistance: Maximum  assistance Feeding assistance: Limited assistance Dressing Assistance: Maximum assistance     Functional Limitations Info  Sight, Hearing, Speech Sight Info: Adequate Hearing Info: Adequate Speech Info: Adequate    SPECIAL CARE FACTORS FREQUENCY  PT (By licensed PT), OT (By licensed OT)     PT Frequency: 5x week OT Frequency: 5x week            Contractures Contractures Info: Not present    Additional Factors Info  Code Status, Allergies Code Status Info: DNR Allergies Info: : Latex, Adhesive (Tape)           Current Medications (04/05/2021):  This is the current hospital active medication list Current Facility-Administered Medications  Medication Dose Route Frequency Provider Last Rate Last Admin   acetaminophen (TYLENOL) tablet 650 mg  650 mg Oral Q6H PRN Marcelyn Bruins, MD       Or   acetaminophen (TYLENOL) suppository 650 mg  650 mg Rectal Q6H PRN Marcelyn Bruins, MD       baclofen (LIORESAL) tablet 20 mg  20 mg Oral Daily PRN Lavina Hamman, MD   20 mg at 04/05/21 1457   DULoxetine (CYMBALTA) DR capsule 60 mg  60 mg Oral Daily Marcelyn Bruins, MD   60 mg at 04/05/21 0957   enoxaparin (LOVENOX) injection 40 mg  40 mg Subcutaneous Daily Marcelyn Bruins, MD   40 mg at 98/33/82 5053   folic acid (FOLVITE) tablet 1 mg  1 mg Oral Daily Marcelyn Bruins,  MD   1 mg at 04/05/21 0957   gabapentin (NEURONTIN) capsule 100 mg  100 mg Oral TID Marcelyn Bruins, MD   100 mg at 04/05/21 0957   HYDROcodone-acetaminophen (NORCO/VICODIN) 5-325 MG per tablet 1 tablet  1 tablet Oral Q6H PRN Lavina Hamman, MD   1 tablet at 04/05/21 0403   lactated ringers infusion   Intravenous Continuous Lavina Hamman, MD 50 mL/hr at 04/05/21 1449 Rate Change at 04/05/21 1449   polyethylene glycol (MIRALAX / GLYCOLAX) packet 17 g  17 g Oral Daily PRN Marcelyn Bruins, MD   17 g at 04/05/21 0957   sodium chloride flush (NS) 0.9 % injection 3 mL  3 mL Intravenous Q12H Marcelyn Bruins, MD   3 mL at 04/05/21 0957   thiamine tablet 100 mg  100 mg Oral Daily Marcelyn Bruins, MD   100 mg at 04/05/21 6151     Discharge Medications: Please see discharge summary for a list of discharge medications.  Relevant Imaging Results:  Relevant Lab Results:   Additional Information SS#: [id number], pt is covid positive.  Pt is not vaccinated for covid.  Joanne Chars, LCSW

## 2021-04-05 NOTE — Evaluation (Signed)
Occupational Therapy Evaluation Patient Details Name: Kelly Singh MRN: [id number] DOB: Feb 22, 1956 Today's Date: 04/05/2021    History of Present Illness Pt is a 65 y/o female admitted 6/29 secondary to increased weakness. Reports she was unable to stand from toilet for prolonged period and ended up on the floor. Recent L shoulder surgery as well. PMH includes fibromyalgia, ataxia, and alcohol abuse.   Clinical Impression   Pt admitted with above and presents with deficits impacting ability to complete self-care tasks and functional mobility and PLOF.  Pt requires min assist for bed mobility but then mod assist for sit > stand due to NWB through LUE s/p recent L shoulder surgery.  Pt requires min-mod assist stand pivot due to impulsivity with decreased awareness of deficits and IV pole. Pt ambulated short distance ~15 feet without AD with min-mod assist due to instability with gait - pt reports using "walking stick" PTA.  Pt reports fall from standard commode at home, reports does have 3 in 1 but that it was in the shower.  Pt would benefit from use of 3 in1 over toilet and then separate tub bench for shower to increase safety and independence.  Pt will benefit from continued OT services acutely to address impairments and increase safety awareness and independence with self-care tasks.  Pt will benefit from SNF upon d/c from hospital and per report family looking at ALF post SNF.      Follow Up Recommendations  SNF;Supervision/Assistance - 24 hour    Equipment Recommendations  Tub/shower bench    Recommendations for Other Services       Precautions / Restrictions Precautions Precautions: Fall Restrictions Other Position/Activity Restrictions: recent h/o L shoulder surgery, has sling      Mobility Bed Mobility Overal bed mobility: Needs Assistance Bed Mobility: Supine to Sit     Supine to sit: Min assist     General bed mobility comments: Pt demonstrating increased mobility  this session, requiring min-Min guard with bed mobility    Transfers Overall transfer level: Needs assistance   Transfers: Sit to/from Stand;Stand Pivot Transfers Sit to Stand: Mod assist Stand pivot transfers: Min assist;Mod assist       General transfer comment: Mod assist to stand then min-mod assist for stand pivot due to impulsivity and decreased safety awareness and IV pole    Balance Overall balance assessment: Needs assistance Sitting-balance support: No upper extremity supported;Feet supported Sitting balance-Leahy Scale: Good     Standing balance support: No upper extremity supported;During functional activity Standing balance-Leahy Scale: Fair                             ADL either performed or assessed with clinical judgement   ADL Overall ADL's : Needs assistance/impaired         Upper Body Bathing: Minimal assistance   Lower Body Bathing: Maximal assistance;Sit to/from stand   Upper Body Dressing : Moderate assistance   Lower Body Dressing: Maximal assistance;Sit to/from stand   Toilet Transfer: Moderate assistance;BSC;Stand-pivot   Toileting- Clothing Manipulation and Hygiene: Maximal assistance       Functional mobility during ADLs: Minimal assistance General ADL Comments: Pt requires mod assist sit > stand due to recent L shoulder surgery and NWB through LUE during sit> stand. Requires mod-max assist for clothing management due to decreased standing balance and decreased functional use of LUE      Pertinent Vitals/Pain Pain Assessment: 0-10 Pain Score: 5  Pain Location:  L shoulder and neck Pain Descriptors / Indicators: Aching;Operative site guarding Pain Intervention(s): Limited activity within patient's tolerance;Monitored during session;Repositioned     Hand Dominance Left   Extremity/Trunk Assessment Upper Extremity Assessment Upper Extremity Assessment: Generalized weakness;LUE deficits/detail LUE Deficits / Details:  recent shoulder surgery, therefore did not assess ROM       Cervical / Trunk Assessment Cervical / Trunk Assessment: Kyphotic;Other exceptions (forward head)   Communication Communication Communication: No difficulties   Cognition Arousal/Alertness: Awake/alert Behavior During Therapy: Restless Overall Cognitive Status: No family/caregiver present to determine baseline cognitive functioning                                 General Comments: Pt very tangential throughout. Decreased safety awareness and awareness of deficits.              Home Living   Living Arrangements: Alone Available Help at Discharge: Family;Available PRN/intermittently Type of Home: Apartment Home Access: Level entry     Home Layout: One level     Bathroom Shower/Tub: Teacher, early years/pre: Standard     Home Equipment: Bedside commode          Prior Functioning/Environment Level of Independence: Needs assistance  Gait / Transfers Assistance Needed: Pt reports she has mostly been in the bed since being home. per previous notes, used walking stick for mobility ADL's / Homemaking Assistance Needed: Per previous admission: Daughter has been assisting with community mobility, groceries, heavy home management.  Patient states she has been doing the best she can with ADL completion.            OT Problem List: Decreased strength;Decreased range of motion;Decreased activity tolerance;Decreased safety awareness;Cardiopulmonary status limiting activity;Pain      OT Treatment/Interventions: Self-care/ADL training;Energy conservation;DME and/or AE instruction;Neuromuscular education;Therapeutic activities;Patient/family education;Balance training    OT Goals(Current goals can be found in the care plan section) Acute Rehab OT Goals Patient Stated Goal: to decrease pain OT Goal Formulation: With patient Time For Goal Achievement: 04/19/21 Potential to Achieve Goals: Good   OT Frequency: Min 2X/week   Barriers to D/C: Decreased caregiver support  pt has stayed in SNF prior to admission.  Daughter assists intermittently     AM-PAC OT "6 Clicks" Daily Activity     Outcome Measure Help from another person eating meals?: None Help from another person taking care of personal grooming?: A Little Help from another person toileting, which includes using toliet, bedpan, or urinal?: A Little Help from another person bathing (including washing, rinsing, drying)?: A Lot Help from another person to put on and taking off regular upper body clothing?: A Little Help from another person to put on and taking off regular lower body clothing?: A Lot 6 Click Score: 17   End of Session Equipment Utilized During Treatment: Gait belt Nurse Communication: Mobility status (need for chair alarm)  Activity Tolerance: Patient tolerated treatment well;No increased pain Patient left: in chair;with call bell/phone within reach  OT Visit Diagnosis: Unsteadiness on feet (R26.81);History of falling (Z91.81);Ataxia, unspecified (R27.0);Pain Pain - Right/Left: Left Pain - part of body: Shoulder                Time: 1740-8144 OT Time Calculation (min): 21 min Charges:  OT General Charges $OT Visit: 1 Visit OT Evaluation $OT Eval Moderate Complexity: [place], [place] 04/05/2021, 9:15 AM

## 2021-04-05 NOTE — Progress Notes (Signed)
Following critical lab results received from Gearlean Alf: WBC 1.2, absolute neutrophils 0.3. Provider, Posey Pronto, notified.

## 2021-04-06 LAB — CBC WITH DIFFERENTIAL/PLATELET
Abs Immature Granulocytes: 0.01 10*3/uL (ref 0.00–0.07)
Basophils Absolute: 0 10*3/uL (ref 0.0–0.1)
Basophils Relative: 1 %
Eosinophils Absolute: 0 10*3/uL (ref 0.0–0.5)
Eosinophils Relative: 2 %
HCT: 28 % — ABNORMAL LOW (ref 36.0–46.0)
Hemoglobin: 9.1 g/dL — ABNORMAL LOW (ref 12.0–15.0)
Immature Granulocytes: 1 %
Lymphocytes Relative: 61 %
Lymphs Abs: 0.8 10*3/uL (ref 0.7–4.0)
MCH: 32.4 pg (ref 26.0–34.0)
MCHC: 32.5 g/dL (ref 30.0–36.0)
MCV: 99.6 fL (ref 80.0–100.0)
Monocytes Absolute: 0.2 10*3/uL (ref 0.1–1.0)
Monocytes Relative: 12 %
Neutro Abs: 0.3 10*3/uL — CL (ref 1.7–7.7)
Neutrophils Relative %: 23 %
Platelets: 102 10*3/uL — ABNORMAL LOW (ref 150–400)
RBC: 2.81 MIL/uL — ABNORMAL LOW (ref 3.87–5.11)
RDW: 15.3 % (ref 11.5–15.5)
WBC: 1.3 10*3/uL — CL (ref 4.0–10.5)
nRBC: 0 % (ref 0.0–0.2)

## 2021-04-06 LAB — COMPREHENSIVE METABOLIC PANEL
ALT: 54 U/L — ABNORMAL HIGH (ref 0–44)
AST: 105 U/L — ABNORMAL HIGH (ref 15–41)
Albumin: 2.8 g/dL — ABNORMAL LOW (ref 3.5–5.0)
Alkaline Phosphatase: 111 U/L (ref 38–126)
Anion gap: 5 (ref 5–15)
BUN: <5 mg/dL — ABNORMAL LOW (ref 8–23)
CO2: 27 mmol/L (ref 22–32)
Calcium: 8 mg/dL — ABNORMAL LOW (ref 8.9–10.3)
Chloride: 106 mmol/L (ref 98–111)
Creatinine, Ser: 0.45 mg/dL (ref 0.44–1.00)
GFR, Estimated: >60 mL/min (ref >60)
Glucose, Bld: 106 mg/dL — ABNORMAL HIGH (ref 70–99)
Potassium: 3.2 mmol/L — ABNORMAL LOW (ref 3.5–5.1)
Sodium: 138 mmol/L (ref 135–145)
Total Bilirubin: 1.4 mg/dL — ABNORMAL HIGH (ref 0.3–1.2)
Total Protein: 6.3 g/dL — ABNORMAL LOW (ref 6.5–8.1)

## 2021-04-06 LAB — CK: Total CK: 851 U/L — ABNORMAL HIGH (ref 38–234)

## 2021-04-06 LAB — MAGNESIUM: Magnesium: 1.7 mg/dL (ref 1.7–2.4)

## 2021-04-06 LAB — C-REACTIVE PROTEIN: CRP: 0.6 mg/dL (ref <1.0)

## 2021-04-06 NOTE — Progress Notes (Signed)
[facility] Progress Note  Patient: Kelly Singh    DVV:[id number]  DOA: 04/03/2021     Date of Service: the patient was seen and examined on 04/06/2021  Brief hospital course: Past medical history of alcohol use, substance use, ataxia, chronic low back pain, cirrhosis, fibromyalgia, depression, MGUS.  Presents with complaint of generalized fatigue. Was on the ground for 12 hours after a fall.  Had rhabdomyolysis on admission. Also incidentally found to have COVID-19 infection. Currently plan is arrange for safe discharge  Subjective: No acute complaint.  No nausea no vomiting.  Had shortness of breath and cough last night which resolved right now constipation resolved.  Assessment and Plan: 1.  Acute nontraumatic rhabdomyolysis. Acute generalized weakness and fall. 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 CK improving. Will reduce IV fluid rate. Patient has chronic cerebellar ataxia as well as recurrent fall and not safe to live alone. PT OT recommends SNF. Social worker working on the case.  Eventually plans to go home.  With daughter.  2.  COVID-19 infection Exposure to daughter with COVID-19 6 days ago admission. Contributing to patient generalized weakness and fatigue. Chest x-ray clear.  Currently on room air. Examination without any respiratory crackles or distress as well. Patient does not believe in any therapy for COVID-19 infection and does not want any therapy for now.  3.  History of MGUS and associated anemia. Chronic thrombocytopenia Neutropenia Neutropenia likely secondary to COVID-19 infection. Thrombocytopenia likely secondary to cirrhosis of the liver history. Patient has chronic anemia no acute bleeding. Associated with MGUS.  4.  Liver cirrhosis Does not appear to have any significant decompensation. Monitor.  5.  Hypokalemia Replaced.  Monitor.  6.  Fibromyalgia, depression Continue Cymbalta. Continue  gabapentin.  7.  Neutropenia. Associated with COVID. For now monitor. If persistent will require further work-up. CBC outpatient in 1 week.  Scheduled Meds:  albuterol  2 puff Inhalation QID   dextromethorphan-guaiFENesin  1 tablet Oral BID   DULoxetine  60 mg Oral Daily   enoxaparin (LOVENOX) injection  40 mg Subcutaneous Daily   folic acid  1 mg Oral Daily   gabapentin  100 mg Oral TID   sodium chloride flush  3 mL Intravenous Q12H   thiamine  100 mg Oral Daily   Continuous Infusions:   PRN Meds: acetaminophen **OR** acetaminophen, baclofen, HYDROcodone-acetaminophen, polyethylene glycol  Body mass index is 21.07 kg/m.        DVT Prophylaxis:   enoxaparin (LOVENOX) injection 40 mg Start: 04/04/21 1000    Advance goals of care discussion: Pt is DNR.  Family Communication: no family was present at bedside, at the time of interview.   Data Reviewed: I have personally reviewed and interpreted daily labs, tele strips, imaging. Potassium still low 3.2.  LFTs are improving.  From 1 65-1 05.  CK improving to 851 only now.  Still has neutropenia.  Physical Exam:  General: Appear in mild distress, no Rash; Oral Mucosa Clear, moist. no Abnormal Neck Mass Or lumps, Conjunctiva normal  Cardiovascular: S1 and S2 Present, no Murmur, Respiratory: good respiratory effort, Bilateral Air entry present and CTA, no Crackles, no wheezes Abdomen: Bowel Sound present, Soft and no tenderness Extremities: no Pedal edema Neurology: alert and oriented to time, place, and person affect appropriate. no new focal deficit Gait not checked due to patient safety concerns  Vitals:   04/05/21 2100 04/05/21 2154 04/06/21 0610 04/06/21 1459  BP:   137/74 (!) 142/84  Pulse:  74 64 66  Resp:  16  18  Temp:  98 F (36.7 C) 97.6 F (36.4 C) 98 F (36.7 C)  TempSrc:  Oral Oral   SpO2: 100% 100% 100% 100%  Weight:      Height:        Disposition:  Status is: Inpatient  Remains inpatient  appropriate because:IV treatments appropriate due to intensity of illness or inability to take PO  Dispo: The patient is from: Home              Anticipated d/c is to: SNF              Patient currently is not medically stable to d/c.   Difficult to place patient No        Time spent: 35 minutes. I reviewed all nursing notes, pharmacy notes, vitals, pertinent old records. I have discussed plan of care as described above with RN.  Author: Berle Mull, MD [facility] 04/06/2021 7:06 PM  To reach On-call, see care teams to locate the attending and reach out via [email or web address]. Between 7PM-7AM, please contact night-coverage If you still have difficulty reaching the attending provider, please page the [facility] (Director on Call) for [facility] on amion for assistance.

## 2021-04-07 DIAGNOSIS — R531 Weakness: Secondary | ICD-10-CM | POA: Diagnosis not present

## 2021-04-07 DIAGNOSIS — R42 Dizziness and giddiness: Secondary | ICD-10-CM | POA: Diagnosis not present

## 2021-04-07 DIAGNOSIS — U071 COVID-19: Secondary | ICD-10-CM | POA: Diagnosis not present

## 2021-04-07 LAB — MAGNESIUM: Magnesium: 1.8 mg/dL (ref 1.7–2.4)

## 2021-04-07 MED ORDER — DM-GUAIFENESIN ER 30-600 MG PO TB12: 1.0000 | ORAL_TABLET | Freq: Two times a day (BID) | ORAL | 0 refills | Status: AC

## 2021-04-07 NOTE — TOC Transition Note (Addendum)
Transition of Care [facility]) - CM/SW Discharge Note   Patient Details  Name: Kelly Singh MRN: [id number] Date of Birth: 03-21-1956  [facility]) CM/SW Contact:  Carles Collet, RN Phone Number: 04/07/2021, 1:29 PM   Clinical Narrative:    Damaris Schooner w patient over the phone.  She verified that she will be discharging to her daughter's house at  [address] [place] She will transport via [place] this has been arranged by CSW. She would like WC she said that Marya Amsler CSW was going to get her one. No order for WC as of yet, I placed order, and will itbe delivered to the daughter's house per patient request. Discussed [place] services. She was active w [place] last year and would like to start services with them again. Referral accepted by Amy, start of care within 48- 72 hours due to holiday. PT OT [facility] SW services will assess for RN need with first visit and add on if needs are beyond scope of PT who can assist with medications and some disease management. No other TOC needs identified.   Received call from [place] who states that the patient tells her that she does not want a WC, she wants a RW and a shower seat.  Spoke w patient and she states that she wants a RW now and a WC in a few weeks when she she gets back to her house. Explained to her that if she got a RW now, her insurance would not cover a WC for 5 years (verified w [employer]), and that a shower seat is not covered with Medicare, but that I could get get her a 3/1.  She began yelling and cursing at me over the phone about her insurance.  She states that she still wants the RW and shower seat. Orders placed for all the above listed equipment for reference for [employer].  Jodell Cipro updated.  Attempted to call daughter, no answer.   Al[place], [place] Daughter   [phone]      Final next level of care: [place] Barriers to Discharge: No Barriers Identified   Patient Goals and CMS Choice Patient  states their goals for this hospitalization and ongoing recovery are:: " independence and getting back to work" [employer].gov Compare Post Acute Care list provided to:: Patient Choice offered to / list presented to : Patient  Discharge Placement              Patient chooses bed at:  (Pt returning home) Patient to be transferred to facility by: [place] Name of family member notified: [name] Patient and family notified of of transfer: 04/07/21  Discharge Plan and Services     Post Acute Care Choice: Home Health          DME Arranged: Lightweight manual wheelchair with seat cushion DME Agency: [employer] Date DME Agency Contacted: 04/07/21 Time DME Agency Contacted: 75 Representative spoke with at DME Agency: [place]: PT, OT, Nurse's Aide, Social Work [employer] Agency: [place] Date [place]: 04/07/21 Time [place]: 1329 Representative spoke with at [place]: [place] ([place]) Interventions     Readmission Risk Interventions No flowsheet data found.

## 2021-04-07 NOTE — Plan of Care (Signed)
Adequate for d/c, pt stable

## 2021-04-07 NOTE — TOC Transition Note (Signed)
Transition of Care [facility]) - CM/SW Discharge Note   Patient Details  Name: Jeannia Tatro MRN: [id number] Date of Birth: 1956/07/01  Transition of [facility]) CM/SW Contact:  Coralee Pesa, [place] Phone Number: 04/07/2021, 1:01 PM   Clinical Narrative:    CSW confirmed with pt's daughter that she was able to go home today. CSW set up Transport. SW  will sign off at this time.   Final next level of care: Home/Self Care Barriers to Discharge: Barriers Resolved   Patient Goals and CMS Choice Patient states their goals for this hospitalization and ongoing recovery are:: " independence and getting back to work" [employer]gov Compare Post Acute Care list provided to:: Patient Choice offered to / list presented to : Patient  Discharge Placement              Patient chooses bed at:  (Pt returning home) Patient to be transferred to facility by: [place] Name of family member notified: [name] Patient and family notified of of transfer: 04/07/21  Discharge Plan and Services     Post Acute Care Choice: Home Health                               Social Determinants of Health (SDOH) Interventions     Readmission Risk Interventions No flowsheet data found.

## 2021-04-07 NOTE — Discharge Summary (Addendum)
[facility] Discharge Summary   Patient: Kelly Singh HWE:[id number]  PCP: Janie Morning, DO  Date of admission: 04/03/2021   Date of discharge:  04/07/2021     Discharge Diagnoses:  Principal Problem:   Acute COVID-19 Active Problems:   Chronic hepatitis C without hepatic coma (HCC)   Gait abnormality   Hypokalemia   Other cirrhosis of liver (HCC)   Cerebellar ataxia in diseases classified elsewhere ([place])   Chronic low back pain   Polysubstance abuse ([place])   Weakness   COVID-19 virus infection   Admitted From: home Disposition:  Home   Recommendations for Outpatient Follow-up:  PCP: Follow-up with PCP in 1 week. Follow-up with oncology in 1 to 2 weeks. Follow up LABS/TEST: CBC in 1 week   Follow-up Information     Janie Morning, DO. Schedule an appointment as soon as possible for a visit in 1 week(s).   Specialty: Family Medicine Contact information: [address] [place] [address] [phone]         Wyatt Portela, MD. Schedule an appointment as soon as possible for a visit in 1 week(s).   Specialty: Oncology Contact information: [place] [address] [phone]                Discharge Instructions     Diet - low sodium heart healthy   Complete by: As directed    Increase activity slowly   Complete by: As directed    No wound care   Complete by: As directed        Diet recommendation: Regular diet  Activity: The patient is advised to gradually reintroduce usual activities, as tolerated  Discharge Condition: stable  Code Status: DNR   History of present illness: As per the H and P dictated on admission, " Kelly Singh is a 65 y.o. female with medical history significant of alcohol use, substance use, cerebellar ataxia, chronic low back pain, cirrhosis, migraines, fibromyalgia, depression, IgG MGUS who presents with acute on chronic weakness.             Patient was seen for  altered mental status at the beginning of this month here.  She was subsequently sent to a SNF and had a shoulder surgery performed while she was at the SNF and then returned to the SNF.  She has been home for 3 days has had significant acute on chronic weakness.  PT has not yet been able to come out to her house.  She has slipped to the floor.  Without hitting her head and now without loss of consciousness.  She states that earlier today she spent at least several hours possibly up to 12 hours on the toilet due to not having strength to get up but she was able to get down and crawl and call for help.  She has been home alone for the last few days. She reports above acute on chronic weakness as well as decreased p.o. intake.  She denies fevers, chills, chest pain, shortness of breath, abdominal pain, constipation, diarrhea, nausea, vomiting.   Daughter states patient reported being on the ground for at least half a day. She feels patient was discharged to early from SNF.  Exposed to Covid 6 days ago by positive family members.   ED Course: Vital signs in the ED were stable.  Lab work-up showed BMP with potassium of 3.1 and calcium of 8.6.  CBC showed hemoglobin of 9.6 down from baseline of 12-13,  WBC 3.5, platelets 145.  CK elevated at 5800, respiratory panel flu COVID positive.  Chest x-ray without acute abnormality.  Urinalysis normal and FOBT negative.  1 L fluids ordered in the ED.  Labs for inflammation with headache and COVID were added on and are pending and include pro time INR, D-dimer, procalcitonin, LDH, ferritin, triglycerides, fibrinogen, CRP, blood cultures, lactic acid."  Hospital Course:  Summary of her active problems in the hospital is as following.   1.  Acute nontraumatic rhabdomyolysis. Acute generalized weakness and fall. 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 CK improving. Will reduce IV fluid rate. Patient has chronic cerebellar ataxia as well as  recurrent fall and not safe to live alone. PT OT recommends SNF. Social worker working on the case. Patient does not want to pay a co-pay to go to SNF. Plan is to go home With daughter.   2.  COVID-19 infection Exposure to daughter with COVID-19 6 days ago admission. Contributing to patient generalized weakness and fatigue. Chest x-ray clear.  Currently on room air. Examination without any respiratory crackles or distress as well. Patient does not believe in any therapy for COVID-19 infection and does not want any therapy for now.   3.  History of MGUS and associated anemia. Chronic thrombocytopenia Neutropenia Neutropenia likely secondary to COVID-19 infection. Thrombocytopenia likely secondary to cirrhosis of the liver history. Patient has chronic anemia no acute bleeding. Associated with MGUS. Follow-up with hematology   4.  Liver cirrhosis Does not appear to have any significant decompensation. Monitor.   5.  Hypokalemia Replaced.  Monitor.   6.  Fibromyalgia, depression Continue Cymbalta. Continue gabapentin.   7.  Neutropenia. Associated with COVID. CBC outpatient in 1 week. Follow-up with hematology  Body mass index is 21.07 kg/m.    Nutrition Interventions:        Patient was seen by physical therapy, who recommended SNF, will be going home with home health. On the day of the discharge the patient's vitals were stable, and no other new acute medical condition were reported. The patient was felt safe to be discharge at Home with Home health.  Consultants: none Procedures: none  DISCHARGE MEDICATION: Allergies as of 04/07/2021       Reactions   Latex Itching, Swelling, Rash, Other (See Comments)   Eats through skin; blister   Adhesive [tape] Other (See Comments)   Plastic tape eats through skin - pls use paper tape        Medication List     TAKE these medications    acetaminophen 325 MG tablet Commonly known as: TYLENOL Take 2 tablets (650 mg  total) by mouth every 6 (six) hours as needed for mild pain or headache.   baclofen 20 MG tablet Commonly known as: LIORESAL Take 20 mg by mouth as needed for muscle spasms.   dextromethorphan-guaiFENesin 30-600 MG 12hr tablet Commonly known as: MUCINEX DM Take 1 tablet by mouth 2 (two) times daily for 7 days.   DULoxetine 60 MG capsule Commonly known as: Cymbalta Take 1 capsule (60 mg total) by mouth daily.   FISH OIL PO Take 1 capsule by mouth daily.   folic acid 1 MG tablet Commonly known as: FOLVITE Take 1 tablet (1 mg total) by mouth daily.   gabapentin 100 MG capsule Commonly known as: Neurontin Take 1 capsule (100 mg total) by mouth 3 (three) times daily.   HYDROcodone-acetaminophen 5-325 MG tablet Commonly known as: NORCO/VICODIN Take 1 tablet by mouth every  6 (six) hours as needed (pain).   multivitamin with minerals Tabs tablet Take 1 tablet by mouth daily.   naproxen sodium 220 MG tablet Commonly known as: ALEVE Take 220 mg by mouth.   thiamine 100 MG tablet Take 1 tablet (100 mg total) by mouth daily.   Vitamin D (Cholecalciferol) 25 MCG (1000 UT) Tabs Take 1 tablet by mouth daily.       ASK your doctor about these medications    ondansetron 4 MG disintegrating tablet Commonly known as: Zofran ODT Take 1 tablet (4 mg total) by mouth every 8 (eight) hours as needed for nausea or vomiting.        Discharge Exam: Filed Weights   04/04/21 1043  Weight: 59.2 kg   Vitals:   04/06/21 2132 04/07/21 0539  BP:  (!) 146/82  Pulse:  69  Resp:    Temp: 98.1 F (36.7 C) 97.9 F (36.6 C)  SpO2:  99%   General: Appear in mild distress, no Rash; Oral Mucosa Clear, moist. no Abnormal Neck Mass Or lumps, Conjunctiva normal  Cardiovascular: S1 and S2 Present, no Murmur, Respiratory: good respiratory effort, Bilateral Air entry present and CTA, no Crackles, no wheezes Abdomen: Bowel Sound present, Soft and no tenderness Extremities: no Pedal  edema Neurology: alert and oriented to time, place, and person affect appropriate. no new focal deficit Gait not checked due to patient safety concerns    The results of significant diagnostics from this hospitalization (including imaging, microbiology, ancillary and laboratory) are listed below for reference.    Significant Diagnostic Studies: DG Chest Portable 1 View  Result Date: 04/03/2021 CLINICAL DATA:  Generalized weakness. EXAM: PORTABLE CHEST 1 VIEW COMPARISON:  March 06, 2021 FINDINGS: The heart size and mediastinal contours are within normal limits. Both lungs are clear. Radiopaque fixation plate and screws are seen within the proximal left humerus. Degenerative changes seen throughout the thoracic spine. IMPRESSION: No active cardiopulmonary disease. Electronically Signed   By: Virgina Norfolk M.D.   On: 04/03/2021 22:26   NCV with EMG(electromyography)  Result Date: 03/27/2021 Marcial Pacas, MD     03/27/2021  9:46 AM     Full Name: [name] Gender: Female MRN #: [id number] Date of Birth: 1956/09/23   Visit Date: 03/27/2021 07:16 Age: 75 Years Examining Physician: Marcial Pacas, MD Referring Physician: Marcial Pacas, MD History: 65 year old female, presenting with more than 10 years history of slowly progressive gait abnormality, dysarthria, dysphagia, also complains of worsening low back pain, bladder incontinence. Summary of the test: Conduction study: Bilateral sural sensory responses were within normal limit.  Right superficial peroneal sensory response was absent; left superficial peroneal sensory response showed significantly decreased snap amplitude. Electromyography: Selected needle examinations were performed at bilateral lower extremity muscles, bilateral lumbosacral paraspinal muscles; right upper extremity muscles and right cervical paraspinal muscles; right mid and lower thoracic paraspinals; right frontalis, right sternocleidomastoid, right orbicularis oculi. There was evidence of  widespread chronic neuropathic changes involving bilateral lumbar sacral myotomes, and right cervical myotomes.  There was no evidence of spontaneous activity, muscle fasciculations. There was also subtle chronic neuropathic changes noted at right sternocleidomastoid, right orbicularis oculi. There was no significant abnormality noted at right frontalis muscle. Conclusion: This is an abnormal study.  There is evidence of chronic bilateral lumbosacral radiculopathy, mainly involving bilateral L4-5 more than S1 myotomes.  This is consistent with her MRI lumbar spine findings.  In addition, there is evidence of subtle, more widespread chronic neuropathic changes involving right cervical  myotomes, right sternocleidomastoid.  Differentiation diagnoses are wide, this include central nervous system degenerative disorder, nutritional deficiency, infection, inflammatory process.  More extensive laboratory evaluation was ordered. ------------------------------- Marcial Pacas M.D. PhD [facility] [address] [place], [place] [address] Tel: [phone] Fax: [phone] Verbal informed consent was obtained from the patient, patient was informed of potential risk of procedure, including bruising, bleeding, hematoma formation, infection, muscle weakness, muscle pain, numbness, among others.     [place]   Nerve / Sites Muscle Latency Ref. Amplitude Ref. Rel Amp Segments Distance Velocity Ref. Area   ms ms mV mV %  cm m/s m/s mVms R Peroneal - EDB    Ankle EDB 6.3 ?6.5 0.5 ?2.0 100 Ankle - EDB 9   2.1    Fib head EDB 15.0  0.5  106 Fib head - Ankle 30 35 ?44 1.9    Pop fossa EDB 17.9  0.4  74.7 Pop fossa - Fib head 10 34 ?44 1.7        Pop fossa - Ankle     L Peroneal - EDB    Ankle EDB 6.4 ?6.5 0.8 ?2.0 100 Ankle - EDB 9   3.8    Fib head EDB 14.3  0.6  77.5 Fib head - Ankle 30 38 ?44 2.8    Pop fossa EDB 17.1  0.7  101 Pop fossa - Fib head 10 36 ?44 2.7        Pop fossa - Ankle     R Tibial - AH    Ankle AH  4.1 ?5.8 7.0 ?4.0 100 Ankle - AH 9   17.4    Pop fossa AH 13.6  4.9  70.1 Pop fossa - Ankle 39 41 ?41 18.8 L Tibial - AH    Ankle AH 3.4 ?5.8 7.8 ?4.0 100 Ankle - AH 9   23.8    Pop fossa AH 13.0  4.7  59.3 Pop fossa - Ankle 39 41 ?41 19.2           SNC   Nerve / Sites Rec. Site Peak Lat Ref.  Amp Ref. Segments Distance   ms ms V V  cm R Sural - Ankle (Calf)    Calf Ankle 3.8 ?4.4 7 ?6 Calf - Ankle 14 L Sural - Ankle (Calf)    Calf Ankle 3.5 ?4.4 7 ?6 Calf - Ankle 14 R Superficial peroneal - Ankle    Lat leg Ankle NR ?4.4 NR ?6 Lat leg - Ankle 10 L Superficial peroneal - Ankle    Lat leg Ankle 3.3 ?4.4 2 ?6 Lat leg - Ankle 10           F  Wave   Nerve F Lat Ref.  ms ms R Tibial - AH 60.6 ?56.0 L Tibial - AH 60.5 ?56.0       EMG Summary Table   Spontaneous MUAP Recruitment Muscle IA Fib PSW Fasc Other Amp Dur. Poly Pattern R. Tibialis anterior Normal None None None _______ Normal Normal Normal Normal R. Tibialis posterior Normal None None None _______ Normal Normal Normal Normal R. Peroneus longus Normal None None None _______ Normal Normal Normal Normal R. Gastrocnemius (Medial head) Normal None None None _______ Normal Normal Normal Normal R. Vastus lateralis Normal None None None _______ Normal Normal Normal Normal L. Tibialis anterior Increased None None None _______ Normal Normal Normal Reduced L. Tibialis posterior Increased None None None _______ Normal Normal Normal Reduced L. Peroneus longus Normal None None  None _______ Normal Normal Normal Reduced L. Gastrocnemius (Medial head) Normal None None None _______ Normal Normal Normal Reduced L. Vastus lateralis Normal None None None _______ Increased Increased Normal Reduced R. Lumbar paraspinals (low) Normal None None None _______ Normal Normal Normal Normal R. Lumbar paraspinals (mid) Normal None None None _______ Normal Normal Normal Normal L. Lumbar paraspinals (low) Normal None None None _______ Normal Normal Normal Normal L. Lumbar paraspinals (mid)  Normal None None None _______ Normal Normal Normal Normal R. First dorsal interosseous Normal None None None _______ Normal Normal Normal Reduced R. Brachioradialis Normal None None None _______ Normal Normal Normal Reduced R. Extensor digitorum communis Normal None None None _______ Normal Normal Normal Reduced R. Biceps brachii Normal None None None _______ Normal Normal Normal Normal R. Deltoid Increased None None None _______ Normal Normal Normal Reduced R. Cervical paraspinals Normal None None None _______ Normal Normal Normal Normal R. Thoracic paraspinals (low) Normal None None None _______ Normal Normal Normal Normal R. Thoracic paraspinals (mid) Normal None None None _______ Normal Normal Normal Normal R. Sternocleidomastoid Increased None None None _______ Normal Normal Normal Reduced R. Frontalis Normal None None None _______ Normal Normal Normal Normal R. Orbicularis oculi Normal None None None _______ Normal Normal Normal Reduced     Microbiology: Recent Results (from the past 240 hour(s))  SARS CORONAVIRUS 2 (TAT 6-24 HRS) Nasopharyngeal Nasopharyngeal Swab     Status: Abnormal   Collection Time: 04/03/21  7:04 PM   Specimen: Nasopharyngeal Swab  Result Value Ref Range Status   SARS Coronavirus 2 POSITIVE (A) NEGATIVE Final    Comment: (NOTE) SARS-CoV-2 target nucleic acids are DETECTED.  The SARS-CoV-2 RNA is generally detectable in upper and lower respiratory specimens during the acute phase of infection. Positive results are indicative of the presence of SARS-CoV-2 RNA. Clinical correlation with patient history and other diagnostic information is  necessary to determine patient infection status. Positive results do not rule out bacterial infection or co-infection with other viruses.  The expected result is Negative.  Fact Sheet for Patients: SugarRoll.be  Fact Sheet for Healthcare Providers: https://www.woods-mathews.com/  This test  is not yet approved or cleared by the [place] FDA and  has been authorized for detection and/or diagnosis of SARS-CoV-2 by FDA under an Emergency Use Authorization (EUA). This EUA will remain  in effect (meaning this test can be used) for the duration of the COVID-19 declaration under Section 564(b)(1) of the Act, 21 U. S.C. section 360bbb-3(b)(1), unless the authorization is terminated or revoked sooner.   Performed at [facility], [place] [address], [place], [place] [address]   Culture, blood (single)     Status: None (Preliminary result)   Collection Time: 04/04/21 12:37 AM   Specimen: BLOOD  Result Value Ref Range Status   Specimen Description BLOOD RIGHT ANTECUBITAL  Final   Special Requests   Final    BOTTLES DRAWN AEROBIC AND ANAEROBIC Blood Culture adequate volume   Culture   Final    NO GROWTH 3 DAYS Performed at [facility], [place] [address] [place], [place] [address]    Report Status PENDING  Incomplete     Labs: CBC: Recent Labs  Lab 04/03/21 1849 04/04/21 0322 04/05/21 0743 04/06/21 0740 04/07/21 0414  WBC 3.5* 2.2* 1.2*  1.3* 1.3* 1.4*  NEUTROABS 2.1  --  0.3* 0.3* 0.3*  HGB 9.6* 8.8* 8.6*  8.9* 9.1* 9.4*  HCT 29.7* 27.6* 26.6*  26.9* 28.0* 29.0*  MCV 99.0  100.4* 100.4*  98.9 99.6 100.3*  PLT 145* 102* 94*  98* 102* 237*   Basic Metabolic Panel: Recent Labs  Lab 04/03/21 1849 04/03/21 2324 04/04/21 0322 04/05/21 0743 04/06/21 0740 04/07/21 0414  NA 134*  --  138 136 138  --   K 3.1*  --  3.2* 3.1* 3.2*  --   CL 100  --  109 105 106  --   CO2 25  --  25 25 27   --   GLUCOSE 104*  --  99 98 106*  --   BUN 12  --  9 <5* <5*  --   CREATININE 0.67  --  0.57 0.45 0.45  --   CALCIUM 8.6*  --  7.8* 7.6* 8.0*  --   MG  --  1.8  --   --  1.7 1.8   Liver Function Tests: Recent Labs  Lab 04/03/21 2324 04/04/21 0322 04/06/21 0740  AST 165* 165* 105*  ALT 51* 52* 54*  ALKPHOS 122 117 111  BILITOT 1.2 1.3* 1.4*  PROT 6.1* 5.7*  6.3*  ALBUMIN 2.8* 2.7* 2.8*   CBG: Recent Labs  Lab 04/03/21 2230  GLUCAP 101*    Time spent: 35 minutes  Signed:  Berle Mull  [facility]  04/07/2021

## 2021-04-07 NOTE — Progress Notes (Signed)
Discharge instructions reviewed with pt, IV removed, pt belongings with pt. Patient verbalizes understanding, waiting for ptar for transport. VS wnl.

## 2021-04-07 NOTE — Care Management (Signed)
    Durable Medical Equipment  (From admission, onward)           Start     Ordered   04/07/21 1321  For home use only DME lightweight manual wheelchair with seat cushion  Once       Comments: Patient suffers from weakness which impairs their ability to perform daily activities like bathing, dressing, walking in the home.  A walker will not resolve  issue with performing activities of daily living. A wheelchair will allow patient to safely perform daily activities. Patient is not able to propel themselves in the home using a standard weight wheelchair due to general weakness. Patient can self propel in the lightweight wheelchair. Length of need Lifetime. Accessories: elevating leg rests (ELRs), wheel locks, extensions and anti-tippers.   04/07/21 1321

## 2021-04-08 NOTE — Progress Notes (Signed)
PTAR services arrived to pick patient up and take her to her daughter's home.  PTAR was unable to take [facility], shower chair, and walker with them d/t protocol.  Patient informed daughter that the DME will be left at the hospital. Daughter has Covid and is unable to pick up at this time.  Unknown when DME will be picked up.   Patient was alert and oriented upon discharge.  VS stable.

## 2021-04-09 LAB — CULTURE, BLOOD (SINGLE)
Culture: NO GROWTH
Special Requests: ADEQUATE

## 2021-04-10 LAB — PATHOLOGIST SMEAR REVIEW

## 2021-04-13 LAB — CBC WITH DIFFERENTIAL/PLATELET
Abs Immature Granulocytes: 0 10*3/uL (ref 0.00–0.07)
Basophils Absolute: 0 10*3/uL (ref 0.0–0.1)
Basophils Relative: 0 %
Eosinophils Absolute: 0 10*3/uL (ref 0.0–0.5)
Eosinophils Relative: 2 %
HCT: 29 % — ABNORMAL LOW (ref 36.0–46.0)
Hemoglobin: 9.4 g/dL — ABNORMAL LOW (ref 12.0–15.0)
Immature Granulocytes: 0 %
Lymphocytes Relative: 64 %
Lymphs Abs: 0.9 10*3/uL (ref 0.7–4.0)
MCH: 32.5 pg (ref 26.0–34.0)
MCHC: 32.4 g/dL (ref 30.0–36.0)
MCV: 100.3 fL — ABNORMAL HIGH (ref 80.0–100.0)
Monocytes Absolute: 0.2 10*3/uL (ref 0.1–1.0)
Monocytes Relative: 11 %
Neutro Abs: 0.3 10*3/uL — CL (ref 1.7–7.7)
Neutrophils Relative %: 23 %
Platelets: 106 10*3/uL — ABNORMAL LOW (ref 150–400)
RBC: 2.89 MIL/uL — ABNORMAL LOW (ref 3.87–5.11)
RDW: 15.4 % (ref 11.5–15.5)
WBC: 1.4 10*3/uL — CL (ref 4.0–10.5)
nRBC: 0 % (ref 0.0–0.2)

## 2021-04-16 DIAGNOSIS — S42202A Unspecified fracture of upper end of left humerus, initial encounter for closed fracture: Secondary | ICD-10-CM | POA: Diagnosis not present

## 2021-04-18 ENCOUNTER — Ambulatory Visit (INDEPENDENT_AMBULATORY_CARE_PROVIDER_SITE_OTHER): Payer: Medicare Other | Admitting: Neurology

## 2021-04-18 ENCOUNTER — Encounter: Payer: Self-pay | Admitting: Neurology

## 2021-04-18 ENCOUNTER — Telehealth: Payer: Self-pay | Admitting: Neurology

## 2021-04-18 VITALS — BP 131/81 | HR 70 | Ht 66.0 in | Wt 128.0 lb

## 2021-04-18 DIAGNOSIS — G3281 Cerebellar ataxia in diseases classified elsewhere: Secondary | ICD-10-CM | POA: Diagnosis not present

## 2021-04-18 DIAGNOSIS — R269 Unspecified abnormalities of gait and mobility: Secondary | ICD-10-CM | POA: Diagnosis not present

## 2021-04-18 NOTE — Telephone Encounter (Signed)
Jasmine Pang, I do not see a referral put in for PT? If this is for home health I would like to inform you that it is likely they will not accept her due to her insurance. Cyril Mourning and I have had a really difficult time finding home health agencies that will accept this type pf insurance because it is not a good payor. Once the order is put in I can check with them but it is not guaranteed they will accept her. It is likely she will have to do outpatient PT.

## 2021-04-18 NOTE — Addendum Note (Signed)
Addended by: Suzzanne Cloud on: 04/18/2021 01:10 PM   Modules accepted: Orders

## 2021-04-18 NOTE — Telephone Encounter (Signed)
Kelly Singh, I saw [name] today for follow-up she is post-hospital discharge, was supposed to be set up for home PT, with [facility] but hasn't heard anything, anyway you can reach out to see if they got the referral and update on her on process she really needs the therapy, high risk for falls.

## 2021-04-18 NOTE — Patient Instructions (Signed)
Please reach out to [facility] to follow-up on PT We will reach out also Be careful not to fall Continue current medications Keep appointment with Dr. Krista Blue in January

## 2021-04-18 NOTE — Telephone Encounter (Signed)
Per Meg with [facility], they can see patient next week. Meg will call her to discuss. They need new orders put in however, so please place a new referral with orders for PT, OT, and SW.

## 2021-04-18 NOTE — Progress Notes (Signed)
PATIENT: Kelly Singh DOB: [birth year]  REASON FOR VISIT: follow up HISTORY FROM: patient Primary Neurologist: Dr. Krista Blue   HISTORY  Kelly Singh is a 65 year old female, seen in request by her primary care physician Dr. Arline Asp, Mitzi Hansen for evaluation of speech difficulty, gait abnormality.  Initial evaluation was on November 03, 2019.   Past medical history of depression, migraine headache, chronic low back pain   Around 2001, she first noticed problem with walking, she denies any previously balance problem, was still riding a motorcycle, then began to notice unsteady gait, getting off path while walking, then began to stumble, and fall, she often tripped on obstacles, in 2001, she had a fall, resulting ankle fracture, her balance then progressively worsened, then noticed difficulty even standing still,   Since 2018, she noticed mild slurred speech, denies trouble swallowing, mild trouble with hand coordination, she denies tremor   She previously lived in [place], had extensive evaluation at that time, then moved to [place] for couple years, was evaluated by movement disorder center Dr, Thermon Leyland on November 02, 2017, she was given the diagnosis of 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, and slow progression of motor symptoms, she was given a trial of carbidopa-levodopa 25/100 mg to 1 tablet 3 times a day, she developed nausea, there was no significant improvement, she was later referred to physical therapy,   She moved to [facility] in October 2020, is referred here to establish neurological care,   She denied a family history of gait problem,   Laboratory evaluations in January 2021 showed mild elevated alkaline phosphate 146, total bilirubin of 2.1, albumin was decreased 3.3, mild elevated AST 68, ALT 63, calcium was decreased 8.0   Nonreactive hepatitis B surface antibody, surface antigen, negative hepatitis C, elevated kappa free light chain 29.7, lambda light chain ratio 1.87    UPDATE Sep 13 2020: She came in without any assistant, complains of slow worsening slurred speech, gait abnormality, denies significant pain   She complains of leg muscle cramps in her leg and feet, also complains of low back pain, radiating pain to left hip, left leg give out underneath her sometimes, she also complained frequent bilateral lower extremity, leg and feet muscle cramps, especially at nighttime, she also complains of intermittent numbness at bilateral shin, but denies bilateral feet paresthesia, she has urinary urgency, occasionally incontinence since 2020,   Reported abnormal x-ray of lumbar spine, showed degenerative changes, but I do not have the report   She has been treated for hepatitis C, 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[employer], in a seated position, preparing vegetable   UPDATE March 27 2021: She continue to progress slowly, complains of worsening dysarthria, mild dysphagia depend on the texture of the food, also worsening gait abnormality, fell multiple times, suffered left humeral fracture recently, planning on elective surgery tomorrow  She also complains of intermittent low back pain, frequent bilateral calf muscle cramp, difficulty sleeping at nighttime, intermittent bilateral feet paresthesia, worsening bladder incontinence, she rely on her walker, cane, wear depends now, also noticed intermittent bilateral upper extremity paresthesia, and loss of the dextrality We personally reviewed MRI of lumbar spine in April 2022, multilevel degenerative changes, most obvious at L4-5, with moderate spinal stenosis, bilateral foraminal stenosis due to disc bulging, facet hypertrophy, incidental left renal cyst 4.4 cm  Also reviewed MRI of the brain in March 2021, generalized atrophy, age disproportional, mild small vessel disease  CT of cervical spine February 2021, multilevel degenerative changes, no significant canal or foraminal narrowing  CT of the chest, small bilateral upper lobe  groundglass nodule, stable from October 2020, cirrhosis with evidence of portal hypertension, left renal stone MRI of abdomen July 2021, stable 1.0 0.7 cm subcapsular lesion of the anterior lobe of the liver, with very minimal peripheral nodular enhancement, suggestive of a benign hemangioma, mild prominence of the central common bile duct, measuring up to 7 mm in caliber, mildly heterogeneous liver parenchyma background enhancement and mild splenomegaly, in keeping with cirrhosis, trace perihepatic ascites, sludge is seen in the gallbladder   Laboratory evaluations: CBC, hemoglobin 13.1, slight decreased WBC of 3.6, normal BMP, creatinine of 0.58, TSH, negative HIV, lactic acid, UDS was positive for marijuana and benzodiazepine, negative alcohol, within normal range ammonia level, kappa/lambda light chain, protein electrophoresis panel   X-ray of left shoulder Mar 04, 2021, distracted, displaced fracture of the surgical neck of left humerus, glenohumeral joint remains in apposition  EMG nerve conduction study today March 25, 2021 showed widespread chronic neuropathic changes, most obvious at bilateral lumbosacral dermatomes, there was also slight chronic neuropathic changes noted at right cervical myotomes, right sternocleidomastoid.  There was no significant abnormality noted at right orbicularis oculi, frontalis, thoracic paraspinals.  Update April 18, 2021 SS: Here today alone.   Extensive laboratory evaluation after NCV with Dr. Krista Blue 03/27/21 was unrevealing (CRP, B12, folate, CK, sed rate, ferritin, copper, ANA, vitamin D, TSH)  Hospitalized 04/03/21-04/07/21, Was positive for COVID, was just discharged from SNF following shoulder surgery, had been at home, so weak sat on the toilet for 12 hours, admitted for rhabdo, general weakness with fall. PT recommended SNF, went home with daughter. Had left shoulder surgery 03/28/21.  Has been at her daughter's house since discharge. She stays in her room. Has  not fallen since has been home. Left shoulder is still recovering, wearing sling. Hasn't started [facility] PT, was supposed to be set up with [facility]. Planning to go to independent living senior community.   Her voice is hoarse, shaky, continues with trouble swallowing, no worse.  Remains on Cymbalta and gabapentin.   REVIEW OF SYSTEMS: Out of a complete 14 system review of symptoms, the patient complains only of the following symptoms, and all other reviewed systems are negative.  See HPI  ALLERGIES: Allergies  Allergen Reactions   Latex Itching, Swelling, Rash and Other (See Comments)    Eats through skin; blister    Adhesive [Tape] Other (See Comments)    Plastic tape eats through skin - pls use paper tape    HOME MEDICATIONS: Outpatient Medications Prior to Visit  Medication Sig Dispense Refill   acetaminophen (TYLENOL) 325 MG tablet Take 2 tablets (650 mg total) by mouth every 6 (six) hours as needed for mild pain or headache.     baclofen (LIORESAL) 20 MG tablet Take 20 mg by mouth as needed for muscle spasms.     DULoxetine (CYMBALTA) 60 MG capsule Take 1 capsule (60 mg total) by mouth daily. 90 capsule 4   folic acid (FOLVITE) 1 MG tablet Take 1 tablet (1 mg total) by mouth daily.     gabapentin (NEURONTIN) 100 MG capsule Take 1 capsule (100 mg total) by mouth 3 (three) times daily. 270 capsule 4   HYDROcodone-acetaminophen (NORCO/VICODIN) 5-325 MG tablet Take 1 tablet by mouth every 6 (six) hours as needed (pain). 10 tablet 0   Multiple Vitamin (MULTIVITAMIN WITH MINERALS) TABS tablet Take 1 tablet by mouth daily. 30 tablet 11   naproxen sodium (ALEVE) 220 MG tablet Take  220 mg by mouth.     Omega-3 Fatty Acids (FISH OIL PO) Take 1 capsule by mouth daily.     ondansetron (ZOFRAN ODT) 4 MG disintegrating tablet Take 1 tablet (4 mg total) by mouth every 8 (eight) hours as needed for nausea or vomiting. 6 tablet 0   thiamine 100 MG tablet Take 1 tablet (100 mg total) by mouth daily.  30 tablet 2   Vitamin D, Cholecalciferol, 25 MCG (1000 UT) TABS Take 1 tablet by mouth daily.     No facility-administered medications prior to visit.    PAST MEDICAL HISTORY: Past Medical History:  Diagnosis Date   Arthritis    Ataxia    Cirrhosis ([place])    Depression with anxiety    Fibromyalgia    Gallbladder sludge    Hiatal hernia    Migraine    Substance abuse ([place])    in 1970's    PAST SURGICAL HISTORY: Past Surgical History:  Procedure Laterality Date   ANKLE SURGERY Right 2011   BREAST ENHANCEMENT SURGERY  1996   SHOULDER ACROMIOPLASTY  03/2021   TUBAL LIGATION  1979   WRIST SURGERY Right 2016    FAMILY HISTORY: Family History  Problem Relation Age of Onset   Bone cancer Mother    Esophageal cancer Father     SOCIAL HISTORY: Social History   Socioeconomic History   Marital status: Single    Spouse name: Not on file   Number of children: 1   Years of education: college   Highest education level: Associate degree: occupational, Hotel manager, or vocational program  Occupational History   Occupation: prep food  Tobacco Use   Smoking status: Former    Types: Cigarettes    Quit date: 1998    Years since quitting: 24.5   Smokeless tobacco: Never  Vaping Use   Vaping Use: Never used  Substance and Sexual Activity   Alcohol use: Yes    Comment: occasional   Drug use: Never   Sexual activity: Not on file  Other Topics Concern   Not on file  Social History Narrative   Lives at home with her granddaughter.   Left-handed.   Caffeine use:c 1-2 cups per day.   Social Determinants of Health   Financial Resource Strain: Not on file  Food Insecurity: Not on file  Transportation Needs: Not on file  Physical Activity: Not on file  Stress: Not on file  Social Connections: Not on file  Intimate Partner Violence: Not on file   PHYSICAL EXAM  Vitals:   04/18/21 0950  BP: 131/81  Pulse: 70  Weight: 128 lb (58.1 kg)  Height: 5\' 6"  (1.676 m)   Body  mass index is 20.66 kg/m.  Generalized: Well developed, in no acute distress  Neurological examination  Mentation: Alert oriented to time, place, history taking. Follows all commands, voice is shaky, hoarse  Cranial nerve II-XII: Pupils were equal round reactive to light. Extraocular movements were full, visual field were full on confrontational test. Facial sensation and strength were normal. Left arm in sling post-op Motor: No significant muscle weakness noted, left shoulder is post-op in sling Sensory: Sensory testing is intact to soft touch on all 4 extremities. No evidence of extinction is noted.  Coordination: Mild dysmetria with right upper, bilateral lowers Gait and station: Has to push off from seated position, gait is wide-based, unsteady, flat footed, limited by left arm sling Reflexes: Deep tendon reflexes are symmetric but increased in uppers, knees  DIAGNOSTIC  DATA (LABS, IMAGING, TESTING) - I reviewed patient records, labs, notes, testing and imaging myself where available.  Lab Results  Component Value Date   WBC 1.4 (LL) 04/07/2021   HGB 9.4 (L) 04/07/2021   HCT 29.0 (L) 04/07/2021   MCV 100.3 (H) 04/07/2021   PLT 106 (L) 04/07/2021      Component Value Date/Time   NA 138 04/06/2021 0740   K 3.2 (L) 04/06/2021 0740   CL 106 04/06/2021 0740   CO2 27 04/06/2021 0740   GLUCOSE 106 (H) 04/06/2021 0740   BUN <5 (L) 04/06/2021 0740   CREATININE 0.45 04/06/2021 0740   CREATININE 0.67 12/07/2020 1353   CREATININE 0.60 11/16/2020 1008   CALCIUM 8.0 (L) 04/06/2021 0740   PROT 6.3 (L) 04/06/2021 0740   ALBUMIN 2.8 (L) 04/06/2021 0740   AST 105 (H) 04/06/2021 0740   AST 20 12/07/2020 1353   ALT 54 (H) 04/06/2021 0740   ALT 14 12/07/2020 1353   ALT 63 (H) 11/01/2019 0955   ALKPHOS 111 04/06/2021 0740   BILITOT 1.4 (H) 04/06/2021 0740   BILITOT 0.7 12/07/2020 1353   GFRNONAA >60 04/06/2021 0740   GFRNONAA >60 12/07/2020 1353   GFRNONAA 96 11/16/2020 1008   GFRAA  112 11/16/2020 1008   Lab Results  Component Value Date   TRIG 49 04/03/2021   No results found for: HGBA1C Lab Results  Component Value Date   VITAMINB12 446 03/27/2021   Lab Results  Component Value Date   TSH 2.464 04/03/2021   ASSESSMENT AND PLAN 65 y.o. year old female  has a past medical history of Arthritis, Ataxia, Cirrhosis ([place]), Depression with anxiety, Fibromyalgia, Gallbladder sludge, Hiatal hernia, Migraine, and Substance abuse ([place]). 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 Krista Blue 03/27/21 was unrevealing (CRP, B12, folate, CK, sed rate, ferritin, copper, ANA, vitamin D, TSH)  Hospitalization for COVID, weakness, rhabdo 04/03/21-04/07/21, now at home with daughter  Hasn't heard back from [facility] about PT, she will reach out, and our office will also, this is very important, long term plan is living independent living community  Has appointment Jan 2023 with Dr. Krista Blue, will keep this   Worsening low back pain, radiating pain to left lower extremity                        Evidence of moderate lumbar stenosis at L4-5,             Cymbalta 60 mg daily, gabapentin 100 mg 3 times a day  Also has hydrocodone as needed  I spent 31 minutes of face-to-face and non-face-to-face time with patient.  This  included previsit chart review, lab review, study review, order entry, discussing hospitalization, home health, medications, and follow-up.  Butler Denmark, AGNP-C, DNP 04/18/2021, 10:24 AM [facility] [address], [place], [place] [address] [phone]

## 2021-04-22 ENCOUNTER — Telehealth: Payer: Self-pay | Admitting: Neurology

## 2021-04-22 DIAGNOSIS — R296 Repeated falls: Secondary | ICD-10-CM | POA: Diagnosis not present

## 2021-04-22 DIAGNOSIS — Z741 Need for assistance with personal care: Secondary | ICD-10-CM | POA: Diagnosis not present

## 2021-04-22 DIAGNOSIS — K7469 Other cirrhosis of liver: Secondary | ICD-10-CM | POA: Diagnosis not present

## 2021-04-22 DIAGNOSIS — R2681 Unsteadiness on feet: Secondary | ICD-10-CM | POA: Diagnosis not present

## 2021-04-22 DIAGNOSIS — M6281 Muscle weakness (generalized): Secondary | ICD-10-CM | POA: Diagnosis not present

## 2021-04-22 DIAGNOSIS — U071 COVID-19: Secondary | ICD-10-CM | POA: Diagnosis not present

## 2021-04-22 DIAGNOSIS — G3281 Cerebellar ataxia in diseases classified elsewhere: Secondary | ICD-10-CM | POA: Diagnosis not present

## 2021-04-22 DIAGNOSIS — Z862 Personal history of diseases of the blood and blood-forming organs and certain disorders involving the immune mechanism: Secondary | ICD-10-CM | POA: Diagnosis not present

## 2021-04-22 DIAGNOSIS — S42202D Unspecified fracture of upper end of left humerus, subsequent encounter for fracture with routine healing: Secondary | ICD-10-CM | POA: Diagnosis not present

## 2021-04-22 NOTE — Telephone Encounter (Signed)
Charri from [place] called with VO of in home PT 2W2 and 1W2. Jerl Mina says pt is having difficulty swallowing but refused speech therapy for now.

## 2021-04-22 NOTE — Telephone Encounter (Signed)
I returned the call to [facility]. She will initiate the orders below to meet the patient's needs.

## 2021-04-25 DIAGNOSIS — K7469 Other cirrhosis of liver: Secondary | ICD-10-CM | POA: Diagnosis not present

## 2021-04-25 DIAGNOSIS — U071 COVID-19: Secondary | ICD-10-CM | POA: Diagnosis not present

## 2021-04-25 DIAGNOSIS — R296 Repeated falls: Secondary | ICD-10-CM | POA: Diagnosis not present

## 2021-04-25 DIAGNOSIS — Z862 Personal history of diseases of the blood and blood-forming organs and certain disorders involving the immune mechanism: Secondary | ICD-10-CM | POA: Diagnosis not present

## 2021-04-25 DIAGNOSIS — M6281 Muscle weakness (generalized): Secondary | ICD-10-CM | POA: Diagnosis not present

## 2021-04-25 DIAGNOSIS — G3281 Cerebellar ataxia in diseases classified elsewhere: Secondary | ICD-10-CM | POA: Diagnosis not present

## 2021-04-25 DIAGNOSIS — S42202D Unspecified fracture of upper end of left humerus, subsequent encounter for fracture with routine healing: Secondary | ICD-10-CM | POA: Diagnosis not present

## 2021-04-25 DIAGNOSIS — R2681 Unsteadiness on feet: Secondary | ICD-10-CM | POA: Diagnosis not present

## 2021-04-25 DIAGNOSIS — Z741 Need for assistance with personal care: Secondary | ICD-10-CM | POA: Diagnosis not present

## 2021-04-29 DIAGNOSIS — M6281 Muscle weakness (generalized): Secondary | ICD-10-CM | POA: Diagnosis not present

## 2021-04-29 DIAGNOSIS — R296 Repeated falls: Secondary | ICD-10-CM | POA: Diagnosis not present

## 2021-04-29 DIAGNOSIS — Z862 Personal history of diseases of the blood and blood-forming organs and certain disorders involving the immune mechanism: Secondary | ICD-10-CM | POA: Diagnosis not present

## 2021-04-29 DIAGNOSIS — Z741 Need for assistance with personal care: Secondary | ICD-10-CM | POA: Diagnosis not present

## 2021-04-29 DIAGNOSIS — U071 COVID-19: Secondary | ICD-10-CM | POA: Diagnosis not present

## 2021-04-29 DIAGNOSIS — G3281 Cerebellar ataxia in diseases classified elsewhere: Secondary | ICD-10-CM | POA: Diagnosis not present

## 2021-04-29 DIAGNOSIS — S42202D Unspecified fracture of upper end of left humerus, subsequent encounter for fracture with routine healing: Secondary | ICD-10-CM | POA: Diagnosis not present

## 2021-04-29 DIAGNOSIS — K7469 Other cirrhosis of liver: Secondary | ICD-10-CM | POA: Diagnosis not present

## 2021-04-29 DIAGNOSIS — R2681 Unsteadiness on feet: Secondary | ICD-10-CM | POA: Diagnosis not present

## 2021-04-29 NOTE — Telephone Encounter (Signed)
OT Will Schalla with [phone]) has called for verbal orders for Range of motion or activity restriction of left arm OT for 1 week 1, 2 week 2 and 1 week 1.  OT Will's vm is secure if a message needs to be left

## 2021-04-30 NOTE — Telephone Encounter (Signed)
Returned the call to Will. He will proceed with the orders below to meet the patient's identified needs.

## 2021-05-01 ENCOUNTER — Other Ambulatory Visit: Payer: Self-pay

## 2021-05-01 DIAGNOSIS — K7460 Unspecified cirrhosis of liver: Secondary | ICD-10-CM

## 2021-05-01 DIAGNOSIS — G3281 Cerebellar ataxia in diseases classified elsewhere: Secondary | ICD-10-CM | POA: Diagnosis not present

## 2021-05-01 DIAGNOSIS — U071 COVID-19: Secondary | ICD-10-CM | POA: Diagnosis not present

## 2021-05-01 DIAGNOSIS — Z741 Need for assistance with personal care: Secondary | ICD-10-CM | POA: Diagnosis not present

## 2021-05-01 DIAGNOSIS — M6281 Muscle weakness (generalized): Secondary | ICD-10-CM | POA: Diagnosis not present

## 2021-05-01 DIAGNOSIS — K7469 Other cirrhosis of liver: Secondary | ICD-10-CM | POA: Diagnosis not present

## 2021-05-01 DIAGNOSIS — Z862 Personal history of diseases of the blood and blood-forming organs and certain disorders involving the immune mechanism: Secondary | ICD-10-CM | POA: Diagnosis not present

## 2021-05-01 DIAGNOSIS — S42202D Unspecified fracture of upper end of left humerus, subsequent encounter for fracture with routine healing: Secondary | ICD-10-CM | POA: Diagnosis not present

## 2021-05-01 DIAGNOSIS — R932 Abnormal findings on diagnostic imaging of liver and biliary tract: Secondary | ICD-10-CM

## 2021-05-01 DIAGNOSIS — R2681 Unsteadiness on feet: Secondary | ICD-10-CM | POA: Diagnosis not present

## 2021-05-01 DIAGNOSIS — R296 Repeated falls: Secondary | ICD-10-CM | POA: Diagnosis not present

## 2021-05-02 DIAGNOSIS — R2681 Unsteadiness on feet: Secondary | ICD-10-CM | POA: Diagnosis not present

## 2021-05-02 DIAGNOSIS — K7469 Other cirrhosis of liver: Secondary | ICD-10-CM | POA: Diagnosis not present

## 2021-05-02 DIAGNOSIS — G3281 Cerebellar ataxia in diseases classified elsewhere: Secondary | ICD-10-CM | POA: Diagnosis not present

## 2021-05-02 DIAGNOSIS — M6281 Muscle weakness (generalized): Secondary | ICD-10-CM | POA: Diagnosis not present

## 2021-05-02 DIAGNOSIS — Z741 Need for assistance with personal care: Secondary | ICD-10-CM | POA: Diagnosis not present

## 2021-05-02 DIAGNOSIS — R296 Repeated falls: Secondary | ICD-10-CM | POA: Diagnosis not present

## 2021-05-02 DIAGNOSIS — Z862 Personal history of diseases of the blood and blood-forming organs and certain disorders involving the immune mechanism: Secondary | ICD-10-CM | POA: Diagnosis not present

## 2021-05-02 DIAGNOSIS — U071 COVID-19: Secondary | ICD-10-CM | POA: Diagnosis not present

## 2021-05-02 DIAGNOSIS — S42202D Unspecified fracture of upper end of left humerus, subsequent encounter for fracture with routine healing: Secondary | ICD-10-CM | POA: Diagnosis not present

## 2021-05-06 DIAGNOSIS — Z862 Personal history of diseases of the blood and blood-forming organs and certain disorders involving the immune mechanism: Secondary | ICD-10-CM | POA: Diagnosis not present

## 2021-05-06 DIAGNOSIS — M6281 Muscle weakness (generalized): Secondary | ICD-10-CM | POA: Diagnosis not present

## 2021-05-06 DIAGNOSIS — S42202D Unspecified fracture of upper end of left humerus, subsequent encounter for fracture with routine healing: Secondary | ICD-10-CM | POA: Diagnosis not present

## 2021-05-06 DIAGNOSIS — R2681 Unsteadiness on feet: Secondary | ICD-10-CM | POA: Diagnosis not present

## 2021-05-06 DIAGNOSIS — R296 Repeated falls: Secondary | ICD-10-CM | POA: Diagnosis not present

## 2021-05-06 DIAGNOSIS — K7469 Other cirrhosis of liver: Secondary | ICD-10-CM | POA: Diagnosis not present

## 2021-05-06 DIAGNOSIS — G3281 Cerebellar ataxia in diseases classified elsewhere: Secondary | ICD-10-CM | POA: Diagnosis not present

## 2021-05-06 DIAGNOSIS — Z741 Need for assistance with personal care: Secondary | ICD-10-CM | POA: Diagnosis not present

## 2021-05-06 DIAGNOSIS — U071 COVID-19: Secondary | ICD-10-CM | POA: Diagnosis not present

## 2021-05-07 DIAGNOSIS — R2681 Unsteadiness on feet: Secondary | ICD-10-CM | POA: Diagnosis not present

## 2021-05-07 DIAGNOSIS — Z862 Personal history of diseases of the blood and blood-forming organs and certain disorders involving the immune mechanism: Secondary | ICD-10-CM | POA: Diagnosis not present

## 2021-05-07 DIAGNOSIS — G894 Chronic pain syndrome: Secondary | ICD-10-CM | POA: Diagnosis not present

## 2021-05-07 DIAGNOSIS — G3281 Cerebellar ataxia in diseases classified elsewhere: Secondary | ICD-10-CM | POA: Diagnosis not present

## 2021-05-07 DIAGNOSIS — Z79899 Other long term (current) drug therapy: Secondary | ICD-10-CM | POA: Diagnosis not present

## 2021-05-07 DIAGNOSIS — M6281 Muscle weakness (generalized): Secondary | ICD-10-CM | POA: Diagnosis not present

## 2021-05-07 DIAGNOSIS — K7469 Other cirrhosis of liver: Secondary | ICD-10-CM | POA: Diagnosis not present

## 2021-05-07 DIAGNOSIS — M545 Low back pain, unspecified: Secondary | ICD-10-CM | POA: Diagnosis not present

## 2021-05-07 DIAGNOSIS — U071 COVID-19: Secondary | ICD-10-CM | POA: Diagnosis not present

## 2021-05-07 DIAGNOSIS — Z741 Need for assistance with personal care: Secondary | ICD-10-CM | POA: Diagnosis not present

## 2021-05-07 DIAGNOSIS — S42202D Unspecified fracture of upper end of left humerus, subsequent encounter for fracture with routine healing: Secondary | ICD-10-CM | POA: Diagnosis not present

## 2021-05-07 DIAGNOSIS — R296 Repeated falls: Secondary | ICD-10-CM | POA: Diagnosis not present

## 2021-05-07 DIAGNOSIS — G8929 Other chronic pain: Secondary | ICD-10-CM | POA: Diagnosis not present

## 2021-05-09 ENCOUNTER — Ambulatory Visit (HOSPITAL_COMMUNITY)
Admission: RE | Admit: 2021-05-09 | Discharge: 2021-05-09 | Disposition: A | Discharge status: Home / Self Care | Payer: Medicare Other | Source: Ambulatory Visit | Attending: Internal Medicine | Admitting: Internal Medicine

## 2021-05-09 ENCOUNTER — Other Ambulatory Visit: Payer: Self-pay

## 2021-05-09 ENCOUNTER — Other Ambulatory Visit: Payer: Self-pay | Admitting: Internal Medicine

## 2021-05-09 DIAGNOSIS — Z862 Personal history of diseases of the blood and blood-forming organs and certain disorders involving the immune mechanism: Secondary | ICD-10-CM | POA: Diagnosis not present

## 2021-05-09 DIAGNOSIS — G3281 Cerebellar ataxia in diseases classified elsewhere: Secondary | ICD-10-CM | POA: Diagnosis not present

## 2021-05-09 DIAGNOSIS — K7460 Unspecified cirrhosis of liver: Secondary | ICD-10-CM

## 2021-05-09 DIAGNOSIS — K7469 Other cirrhosis of liver: Secondary | ICD-10-CM | POA: Diagnosis not present

## 2021-05-09 DIAGNOSIS — Z741 Need for assistance with personal care: Secondary | ICD-10-CM | POA: Diagnosis not present

## 2021-05-09 DIAGNOSIS — R932 Abnormal findings on diagnostic imaging of liver and biliary tract: Secondary | ICD-10-CM

## 2021-05-09 DIAGNOSIS — M6281 Muscle weakness (generalized): Secondary | ICD-10-CM | POA: Diagnosis not present

## 2021-05-09 DIAGNOSIS — K7689 Other specified diseases of liver: Secondary | ICD-10-CM | POA: Diagnosis not present

## 2021-05-09 DIAGNOSIS — R161 Splenomegaly, not elsewhere classified: Secondary | ICD-10-CM | POA: Diagnosis not present

## 2021-05-09 DIAGNOSIS — U071 COVID-19: Secondary | ICD-10-CM | POA: Diagnosis not present

## 2021-05-09 DIAGNOSIS — N281 Cyst of kidney, acquired: Secondary | ICD-10-CM | POA: Diagnosis not present

## 2021-05-09 DIAGNOSIS — S42202D Unspecified fracture of upper end of left humerus, subsequent encounter for fracture with routine healing: Secondary | ICD-10-CM | POA: Diagnosis not present

## 2021-05-09 DIAGNOSIS — R296 Repeated falls: Secondary | ICD-10-CM | POA: Diagnosis not present

## 2021-05-09 DIAGNOSIS — R2681 Unsteadiness on feet: Secondary | ICD-10-CM | POA: Diagnosis not present

## 2021-05-09 MED ORDER — GADOBUTROL 1 MMOL/ML IV SOLN
6.0000 mL | Freq: Once | INTRAVENOUS | Status: AC | PRN
  Administered 2021-05-09: 6 mL via INTRAVENOUS

## 2021-05-13 ENCOUNTER — Telehealth: Payer: Self-pay | Admitting: Neurology

## 2021-05-13 DIAGNOSIS — Z862 Personal history of diseases of the blood and blood-forming organs and certain disorders involving the immune mechanism: Secondary | ICD-10-CM | POA: Diagnosis not present

## 2021-05-13 DIAGNOSIS — Z741 Need for assistance with personal care: Secondary | ICD-10-CM | POA: Diagnosis not present

## 2021-05-13 DIAGNOSIS — U071 COVID-19: Secondary | ICD-10-CM | POA: Diagnosis not present

## 2021-05-13 DIAGNOSIS — M6281 Muscle weakness (generalized): Secondary | ICD-10-CM | POA: Diagnosis not present

## 2021-05-13 DIAGNOSIS — G3281 Cerebellar ataxia in diseases classified elsewhere: Secondary | ICD-10-CM | POA: Diagnosis not present

## 2021-05-13 DIAGNOSIS — S42202D Unspecified fracture of upper end of left humerus, subsequent encounter for fracture with routine healing: Secondary | ICD-10-CM | POA: Diagnosis not present

## 2021-05-13 DIAGNOSIS — K7469 Other cirrhosis of liver: Secondary | ICD-10-CM | POA: Diagnosis not present

## 2021-05-13 DIAGNOSIS — R296 Repeated falls: Secondary | ICD-10-CM | POA: Diagnosis not present

## 2021-05-13 DIAGNOSIS — R2681 Unsteadiness on feet: Secondary | ICD-10-CM | POA: Diagnosis not present

## 2021-05-13 NOTE — Telephone Encounter (Signed)
Return call to patient.  Patient stated she has tried the medication change from Dr. Krista Blue for 2 months.  She has been crying a lot and the neurontin isn't helping with pain at all.  She has already stopped taking the Cymbalta and neurontin last week.  She stated that the medication she was on before, helped her more.   She said she doesn't need [place] to put her back on the Norco/vicodin.  She wants to go back on the baclofen and sertraline.   She said the norco/vicodin was for the arthritis in her back and she sees an arthritis specialists now.    She is aware Dr. Krista Blue is out of the office until tomorrow and wants to wait until she returns because she is the one that changed her medications.    Patient denied further questions, verbalized understanding and expressed appreciation for the phone call.

## 2021-05-13 NOTE — Telephone Encounter (Signed)
Pt states DULoxetine (CYMBALTA) 60 MG capsule & gabapentin (NEURONTIN) 100 MG capsule are not working, Pt wants to go back on baclofen (LIORESAL) 20 MG tablet, HYDROcodone-acetaminophen (NORCO/VICODIN) 5-325 MG tablet and Sertriline.  Please call pt to discuss.

## 2021-05-14 ENCOUNTER — Other Ambulatory Visit: Payer: Self-pay | Admitting: Emergency Medicine

## 2021-05-14 DIAGNOSIS — K7469 Other cirrhosis of liver: Secondary | ICD-10-CM | POA: Diagnosis not present

## 2021-05-14 DIAGNOSIS — G3281 Cerebellar ataxia in diseases classified elsewhere: Secondary | ICD-10-CM | POA: Diagnosis not present

## 2021-05-14 DIAGNOSIS — M25512 Pain in left shoulder: Secondary | ICD-10-CM | POA: Diagnosis not present

## 2021-05-14 DIAGNOSIS — S42202D Unspecified fracture of upper end of left humerus, subsequent encounter for fracture with routine healing: Secondary | ICD-10-CM | POA: Diagnosis not present

## 2021-05-14 DIAGNOSIS — R296 Repeated falls: Secondary | ICD-10-CM | POA: Diagnosis not present

## 2021-05-14 DIAGNOSIS — R2681 Unsteadiness on feet: Secondary | ICD-10-CM | POA: Diagnosis not present

## 2021-05-14 DIAGNOSIS — M6281 Muscle weakness (generalized): Secondary | ICD-10-CM | POA: Diagnosis not present

## 2021-05-14 DIAGNOSIS — Z862 Personal history of diseases of the blood and blood-forming organs and certain disorders involving the immune mechanism: Secondary | ICD-10-CM | POA: Diagnosis not present

## 2021-05-14 DIAGNOSIS — U071 COVID-19: Secondary | ICD-10-CM | POA: Diagnosis not present

## 2021-05-14 DIAGNOSIS — Z741 Need for assistance with personal care: Secondary | ICD-10-CM | POA: Diagnosis not present

## 2021-05-14 MED ORDER — BACLOFEN 10 MG PO TABS: 10.0000 mg | ORAL_TABLET | Freq: Three times a day (TID) | ORAL | 1 refills | Status: DC

## 2021-05-14 MED ORDER — SERTRALINE HCL 50 MG PO TABS: 50.0000 mg | ORAL_TABLET | Freq: Every morning | ORAL | 1 refills | Status: DC

## 2021-05-14 NOTE — Telephone Encounter (Signed)
It is Ok for her to go back on baclofen and zoloft.   Both medications needs to be on stable dose, rather than Prn  Follow up with me scheduled for Jan 2023, if she wants rx from our office, it is Ok to go back to previous dosage.

## 2021-05-14 NOTE — Telephone Encounter (Signed)
Called patient back and told her Dr. Krista Blue said it was okay to go back on the zoloft and cymbalta.  New prescriptions were sent in to [facility].  Patient was educated that the medication had to be taken as prescribed, not as she felt she needed them.  She agreed to take all medications as prescribed.    Patient denied further questions, verbalized understanding and expressed appreciation for the phone call.

## 2021-05-15 DIAGNOSIS — Z0001 Encounter for general adult medical examination with abnormal findings: Secondary | ICD-10-CM | POA: Diagnosis not present

## 2021-05-15 DIAGNOSIS — Z79899 Other long term (current) drug therapy: Secondary | ICD-10-CM | POA: Diagnosis not present

## 2021-05-15 DIAGNOSIS — M549 Dorsalgia, unspecified: Secondary | ICD-10-CM | POA: Diagnosis not present

## 2021-05-15 DIAGNOSIS — G8929 Other chronic pain: Secondary | ICD-10-CM | POA: Diagnosis not present

## 2021-05-15 DIAGNOSIS — M1990 Unspecified osteoarthritis, unspecified site: Secondary | ICD-10-CM | POA: Diagnosis not present

## 2021-05-15 DIAGNOSIS — Z0000 Encounter for general adult medical examination without abnormal findings: Secondary | ICD-10-CM | POA: Diagnosis not present

## 2021-05-15 DIAGNOSIS — S42302A Unspecified fracture of shaft of humerus, left arm, initial encounter for closed fracture: Secondary | ICD-10-CM | POA: Diagnosis not present

## 2021-05-16 ENCOUNTER — Telehealth: Payer: Self-pay | Admitting: Internal Medicine

## 2021-05-16 ENCOUNTER — Other Ambulatory Visit: Payer: Self-pay | Admitting: *Deleted

## 2021-05-16 MED ORDER — BACLOFEN 10 MG PO TABS: 10.0000 mg | ORAL_TABLET | Freq: Three times a day (TID) | ORAL | 1 refills | Status: DC

## 2021-05-16 NOTE — Telephone Encounter (Signed)
Message from [place] is as follows:  "I understand not cirrhosis but fibrosis.   should I just continue with folic acid,milk thistle, fish oil,  vitamin D, Avocado and artichoke ?  Kelly Singh had me on a regiment of vitamins in the hospital .  I had left humerus break had 12ish hours with distress before EMT brought me to [facility] then left in ER waiting for a room to open. So urine was color of tea. toxins..  2 days of fluids IVed. Doctor Rowe Singh is a great physician. He knew just what to do to help my over worked liver. I  also contacted covid the day of surgery.   It's been a long road . Anything else you suggest? The Harvoni did a pretty good job of shrinking the Inflammation.  would it be beneficial to start a new regiment of Harvoni? we have 41mo.before my appointment with you,is there anything you need ? Blood work, colon scope "

## 2021-05-16 NOTE — Telephone Encounter (Signed)
I received the patient's email  Please explain to her that she has known cirrhosis, though cirrhosis is a type of advanced fibrosis. We did an upper endoscopy to screen for varices which were not present last year She has been successfully treated for hepatitis C and thus she does not need additional Harvoni I am concerned about underlying autoimmune hepatitis which may warrant further treatment given her persistent elevated liver enzymes. It is okay for her to take folic acid, milk thistle, fish oil, vitamin D, and I do not see a problem with artichoke or avocado. I do recommend that she come for CBC, CMP, INR and IgG  Please place her on cancellation list for sooner appointment with me

## 2021-05-17 ENCOUNTER — Other Ambulatory Visit: Payer: Self-pay

## 2021-05-17 ENCOUNTER — Encounter: Payer: Self-pay | Admitting: *Deleted

## 2021-05-17 DIAGNOSIS — K7460 Unspecified cirrhosis of liver: Secondary | ICD-10-CM

## 2021-05-17 NOTE — Telephone Encounter (Signed)
Pt notified via mychart, orders in epic for labs. Appt placed on cx list.

## 2021-05-21 DIAGNOSIS — M25512 Pain in left shoulder: Secondary | ICD-10-CM | POA: Diagnosis not present

## 2021-05-21 DIAGNOSIS — Z4789 Encounter for other orthopedic aftercare: Secondary | ICD-10-CM | POA: Diagnosis not present

## 2021-05-21 DIAGNOSIS — Z9889 Other specified postprocedural states: Secondary | ICD-10-CM | POA: Diagnosis not present

## 2021-05-22 DIAGNOSIS — G3281 Cerebellar ataxia in diseases classified elsewhere: Secondary | ICD-10-CM | POA: Diagnosis not present

## 2021-05-22 DIAGNOSIS — R2681 Unsteadiness on feet: Secondary | ICD-10-CM | POA: Diagnosis not present

## 2021-05-22 DIAGNOSIS — Z8619 Personal history of other infectious and parasitic diseases: Secondary | ICD-10-CM | POA: Diagnosis not present

## 2021-05-22 DIAGNOSIS — R296 Repeated falls: Secondary | ICD-10-CM | POA: Diagnosis not present

## 2021-05-22 DIAGNOSIS — Z862 Personal history of diseases of the blood and blood-forming organs and certain disorders involving the immune mechanism: Secondary | ICD-10-CM | POA: Diagnosis not present

## 2021-05-22 DIAGNOSIS — K7469 Other cirrhosis of liver: Secondary | ICD-10-CM | POA: Diagnosis not present

## 2021-05-22 DIAGNOSIS — K7460 Unspecified cirrhosis of liver: Secondary | ICD-10-CM | POA: Diagnosis not present

## 2021-05-22 DIAGNOSIS — U071 COVID-19: Secondary | ICD-10-CM | POA: Diagnosis not present

## 2021-05-22 DIAGNOSIS — R270 Ataxia, unspecified: Secondary | ICD-10-CM | POA: Diagnosis not present

## 2021-05-22 DIAGNOSIS — Z8669 Personal history of other diseases of the nervous system and sense organs: Secondary | ICD-10-CM | POA: Diagnosis not present

## 2021-05-22 DIAGNOSIS — Z741 Need for assistance with personal care: Secondary | ICD-10-CM | POA: Diagnosis not present

## 2021-05-22 DIAGNOSIS — M48061 Spinal stenosis, lumbar region without neurogenic claudication: Secondary | ICD-10-CM | POA: Diagnosis not present

## 2021-05-22 DIAGNOSIS — M6281 Muscle weakness (generalized): Secondary | ICD-10-CM | POA: Diagnosis not present

## 2021-05-22 DIAGNOSIS — S42202D Unspecified fracture of upper end of left humerus, subsequent encounter for fracture with routine healing: Secondary | ICD-10-CM | POA: Diagnosis not present

## 2021-05-27 ENCOUNTER — Emergency Department (HOSPITAL_COMMUNITY)
Admission: EM | Admit: 2021-05-27 | Discharge: 2021-05-28 | Disposition: A | Discharge status: Home / Self Care | Payer: Medicare Other | Attending: Emergency Medicine | Admitting: Emergency Medicine

## 2021-05-27 ENCOUNTER — Encounter (HOSPITAL_COMMUNITY): Payer: Self-pay

## 2021-05-27 ENCOUNTER — Emergency Department (HOSPITAL_COMMUNITY): Payer: Medicare Other

## 2021-05-27 ENCOUNTER — Other Ambulatory Visit: Payer: Self-pay

## 2021-05-27 DIAGNOSIS — W19XXXA Unspecified fall, initial encounter: Secondary | ICD-10-CM | POA: Insufficient documentation

## 2021-05-27 DIAGNOSIS — Z87891 Personal history of nicotine dependence: Secondary | ICD-10-CM | POA: Diagnosis not present

## 2021-05-27 DIAGNOSIS — R519 Headache, unspecified: Secondary | ICD-10-CM | POA: Insufficient documentation

## 2021-05-27 DIAGNOSIS — S199XXA Unspecified injury of neck, initial encounter: Secondary | ICD-10-CM | POA: Diagnosis not present

## 2021-05-27 DIAGNOSIS — S0993XA Unspecified injury of face, initial encounter: Secondary | ICD-10-CM | POA: Diagnosis not present

## 2021-05-27 DIAGNOSIS — Z9889 Other specified postprocedural states: Secondary | ICD-10-CM | POA: Diagnosis not present

## 2021-05-27 DIAGNOSIS — M25512 Pain in left shoulder: Secondary | ICD-10-CM | POA: Diagnosis not present

## 2021-05-27 DIAGNOSIS — M47812 Spondylosis without myelopathy or radiculopathy, cervical region: Secondary | ICD-10-CM | POA: Diagnosis not present

## 2021-05-27 DIAGNOSIS — I10 Essential (primary) hypertension: Secondary | ICD-10-CM | POA: Diagnosis not present

## 2021-05-27 DIAGNOSIS — M7989 Other specified soft tissue disorders: Secondary | ICD-10-CM | POA: Diagnosis not present

## 2021-05-27 DIAGNOSIS — R911 Solitary pulmonary nodule: Secondary | ICD-10-CM | POA: Diagnosis not present

## 2021-05-27 DIAGNOSIS — Z4789 Encounter for other orthopedic aftercare: Secondary | ICD-10-CM | POA: Diagnosis not present

## 2021-05-27 NOTE — ED Provider Notes (Signed)
Emergency Medicine Provider Triage Evaluation Note  Kelly Singh , a 65 y.o. female  was evaluated in triage.  Pt complains of mechanical fall on Saturday.  She has been being seen for this yet.  She states that she went to PT today and her doctor and they recommended that she come here for scans.  She reports that she butterflied the lacerations on her face.  She has had worsening headaches since.  No new weakness or numbness..  Review of Systems  Positive: Fall, lacerations, headache, facial pain, neck pain Negative: Syncope, fever  Physical Exam  BP (!) 162/91 (BP Location: Right Arm)   Pulse 80   Temp 98.7 F (37.1 C) (Oral)   Resp 18   SpO2 97%  Gen:   Awake, no distress   Resp:  Normal effort  MSK:   Moves extremities without difficulty  Other:  Multiple contusions and abrasions across face.  Medical Decision Making  Medically screening exam initiated at 6:02 PM.  Appropriate orders placed.  [name] was informed that the remainder of the evaluation will be completed by another provider, this initial triage assessment does not replace that evaluation, and the importance of remaining in the ED until their evaluation is complete.  Patient is a 65 year old woman sent here for evaluation of headache. She fell on Saturday, denies syncopal event. Based on her age, multiple contusions abrasions and laceration on her face, and headache will obtain CT scans of head, face, neck.  She had originally reported that she was sent by her doctor for an MRI.  I cannot find any corroborating notes of this and when I specifically asked her she says that she was not sent for an MRI, that she was just told to get scans so she assumed he meant MRI.  Note: Portions of this report may have been transcribed using voice recognition software. Every effort was made to ensure accuracy; however, inadvertent computerized transcription errors may be present    [name] 05/27/21  1804    Arnaldo Natal, MD 05/27/21 2328

## 2021-05-27 NOTE — ED Triage Notes (Signed)
Patient had mechanical fall on Saturday and has abrasions and lac to face, no loc.  Patient complains of headache and sent by her MD for further evaluation.

## 2021-05-28 NOTE — ED Provider Notes (Signed)
[facility] Emergency Department Provider Note MRN:  [id number]  Arrival date & time: 05/28/21     Chief Complaint   Fall, facial pain History of Present Illness   Kelly Singh is a 65 y.o. year-old female with a history of hepatitis C, fibromyalgia presenting to the ED with chief complaint of fall, facial pain.  Location: Forehead, nose, right cheek Duration: 2 days Onset: Sudden Timing: Constant mild facial pain Description: Dull Severity: Mild Exacerbating/Alleviating Factors: Worse with movement Associated Symptoms: None Pertinent Negatives: Denies nausea or vomiting, no pain or injuries to the chest, abdomen, back, leg  Additional History: Was carrying some objects and tripped and fell causing the fall  Review of Systems  A complete 10 system review of systems was obtained and all systems are negative except as noted in the HPI and PMH.   Patient's Health History    Past Medical History:  Diagnosis Date   Arthritis    Ataxia    Cerebellar ataxia ([place])    Chronic hepatitis C ([place])    Cirrhosis ([place])    Depression with anxiety    Fibromyalgia    Gallbladder sludge    Hiatal hernia    MGUS (monoclonal gammopathy of unknown significance)    hx with associated anemia   Migraine    Polysubstance abuse ([place])    in 1970's   Portal venous hypertension ([facility])     Past Surgical History:  Procedure Laterality Date   ANKLE SURGERY Right 2011   [place]   SHOULDER ACROMIOPLASTY  03/2021   TUBAL LIGATION  1979   WRIST SURGERY Right 2016    Family History  Problem Relation Age of Onset   Bone cancer Mother    Esophageal cancer Father     Social History   Socioeconomic History   Marital status: Single    Spouse name: Not on file   Number of children: 1   Years of education: college   Highest education level: Associate degree: occupational, Hotel manager, or vocational program  Occupational History   Occupation:  prep food  Tobacco Use   Smoking status: Former    Types: Cigarettes    Quit date: 1998    Years since quitting: 24.6   Smokeless tobacco: Never  Vaping Use   Vaping Use: Never used  Substance and Sexual Activity   Alcohol use: Yes    Comment: occasional   Drug use: Never   Sexual activity: Not on file  Other Topics Concern   Not on file  Social History Narrative   Lives at home with her granddaughter.   Left-handed.   Caffeine use:c 1-2 cups per day.   Social Determinants of Health   Financial Resource Strain: Not on file  Food Insecurity: Not on file  Transportation Needs: Not on file  Physical Activity: Not on file  Stress: Not on file  Social Connections: Not on file  Intimate Partner Violence: Not on file     Physical Exam   Vitals:   05/28/21 0050 05/28/21 0315  BP: (!) 163/97 (!) 190/104  Pulse: 70 67  Resp: 18 17  Temp:    SpO2: 100% 100%    CONSTITUTIONAL: Well-appearing, NAD NEURO:  Alert and oriented x 3, no focal deficits EYES:  eyes equal and reactive ENT/NECK:  no LAD, no JVD CARDIO: Regular rate, well-perfused, normal S1 and S2 PULM:  CTAB no wheezing or rhonchi GI/GU:  normal bowel sounds, non-distended, non-tender MSK/SPINE:  No gross  deformities, no edema SKIN: Scattered abrasions to the forehead, nose, right cheek PSYCH:  Appropriate speech and behavior  *Additional and/or pertinent findings included in MDM below  Diagnostic and Interventional Summary    EKG Interpretation  Date/Time:    Ventricular Rate:    PR Interval:    QRS Duration:   QT Interval:    QTC Calculation:   R Axis:     Text Interpretation:         Labs Reviewed - No data to display  CT Head Wo Contrast  Final Result    CT Cervical Spine Wo Contrast  Final Result    CT Maxillofacial WO CM  Final Result      Medications - No data to display   Procedures  /  Critical Care Procedures  ED Course and Medical Decision Making  I have reviewed the  triage vital signs, the nursing notes, and pertinent available records from the EMR.  Listed above are laboratory and imaging tests that I personally ordered, reviewed, and interpreted and then considered in my medical decision making (see below for details).  Mechanical fall, CT imaging reassuring, otherwise feels well, appropriate for discharge.       Barth Kirks. Sedonia Small, [place] [email or web address]  Final Clinical Impressions(s) / ED Diagnoses     ICD-10-CM   1. Fall, initial encounter  W19.XXXA     2. Facial injury, initial encounter  S09.[id number]       ED Discharge Orders     None        Discharge Instructions Discussed with and Provided to Patient:    Discharge Instructions      You were evaluated in the Emergency Department and after careful evaluation, we did not find any emergent condition requiring admission or further testing in the hospital.  Your exam/testing today was overall reassuring.  CT scans did not show any significant injuries or broken bones.  Please return to the Emergency Department if you experience any worsening of your condition.  Thank you for allowing [place] to be a part of your care.        Maudie Flakes, MD 05/28/21 [phone]

## 2021-05-28 NOTE — Discharge Instructions (Addendum)
You were evaluated in the Emergency Department and after careful evaluation, we did not find any emergent condition requiring admission or further testing in the hospital.  Your exam/testing today was overall reassuring.  CT scans did not show any significant injuries or broken bones.  Please return to the Emergency Department if you experience any worsening of your condition.  Thank you for allowing [place] to be a part of your care.

## 2021-05-29 DIAGNOSIS — M47816 Spondylosis without myelopathy or radiculopathy, lumbar region: Secondary | ICD-10-CM | POA: Diagnosis not present

## 2021-06-03 DIAGNOSIS — M6281 Muscle weakness (generalized): Secondary | ICD-10-CM | POA: Diagnosis not present

## 2021-06-03 DIAGNOSIS — Z9181 History of falling: Secondary | ICD-10-CM | POA: Diagnosis not present

## 2021-06-03 DIAGNOSIS — S42202D Unspecified fracture of upper end of left humerus, subsequent encounter for fracture with routine healing: Secondary | ICD-10-CM | POA: Diagnosis not present

## 2021-06-03 DIAGNOSIS — M25512 Pain in left shoulder: Secondary | ICD-10-CM | POA: Diagnosis not present

## 2021-06-03 DIAGNOSIS — R2689 Other abnormalities of gait and mobility: Secondary | ICD-10-CM | POA: Diagnosis not present

## 2021-06-03 DIAGNOSIS — M25612 Stiffness of left shoulder, not elsewhere classified: Secondary | ICD-10-CM | POA: Diagnosis not present

## 2021-06-05 DIAGNOSIS — M25511 Pain in right shoulder: Secondary | ICD-10-CM | POA: Diagnosis not present

## 2021-06-12 DIAGNOSIS — Z471 Aftercare following joint replacement surgery: Secondary | ICD-10-CM | POA: Diagnosis not present

## 2021-06-12 DIAGNOSIS — Z9889 Other specified postprocedural states: Secondary | ICD-10-CM | POA: Diagnosis not present

## 2021-06-12 DIAGNOSIS — M25552 Pain in left hip: Secondary | ICD-10-CM | POA: Diagnosis not present

## 2021-06-13 DIAGNOSIS — Z9889 Other specified postprocedural states: Secondary | ICD-10-CM | POA: Diagnosis not present

## 2021-06-13 DIAGNOSIS — Z471 Aftercare following joint replacement surgery: Secondary | ICD-10-CM | POA: Diagnosis not present

## 2021-06-13 DIAGNOSIS — M25552 Pain in left hip: Secondary | ICD-10-CM | POA: Diagnosis not present

## 2021-06-17 DIAGNOSIS — Z8781 Personal history of (healed) traumatic fracture: Secondary | ICD-10-CM | POA: Diagnosis not present

## 2021-06-20 DIAGNOSIS — Z9889 Other specified postprocedural states: Secondary | ICD-10-CM | POA: Diagnosis not present

## 2021-06-20 DIAGNOSIS — M25552 Pain in left hip: Secondary | ICD-10-CM | POA: Diagnosis not present

## 2021-06-20 DIAGNOSIS — Z471 Aftercare following joint replacement surgery: Secondary | ICD-10-CM | POA: Diagnosis not present

## 2021-06-25 DIAGNOSIS — Z1211 Encounter for screening for malignant neoplasm of colon: Secondary | ICD-10-CM | POA: Diagnosis not present

## 2021-06-25 DIAGNOSIS — R252 Cramp and spasm: Secondary | ICD-10-CM | POA: Diagnosis not present

## 2021-06-25 DIAGNOSIS — Z008 Encounter for other general examination: Secondary | ICD-10-CM | POA: Diagnosis not present

## 2021-06-25 DIAGNOSIS — M1990 Unspecified osteoarthritis, unspecified site: Secondary | ICD-10-CM | POA: Diagnosis not present

## 2021-06-25 DIAGNOSIS — Z471 Aftercare following joint replacement surgery: Secondary | ICD-10-CM | POA: Diagnosis not present

## 2021-06-25 DIAGNOSIS — Z79899 Other long term (current) drug therapy: Secondary | ICD-10-CM | POA: Diagnosis not present

## 2021-06-25 DIAGNOSIS — Z7189 Other specified counseling: Secondary | ICD-10-CM | POA: Diagnosis not present

## 2021-06-25 DIAGNOSIS — Z9889 Other specified postprocedural states: Secondary | ICD-10-CM | POA: Diagnosis not present

## 2021-06-25 DIAGNOSIS — M48062 Spinal stenosis, lumbar region with neurogenic claudication: Secondary | ICD-10-CM | POA: Diagnosis not present

## 2021-06-25 DIAGNOSIS — M25552 Pain in left hip: Secondary | ICD-10-CM | POA: Diagnosis not present

## 2021-06-25 DIAGNOSIS — Z0000 Encounter for general adult medical examination without abnormal findings: Secondary | ICD-10-CM | POA: Diagnosis not present

## 2021-06-27 DIAGNOSIS — Z471 Aftercare following joint replacement surgery: Secondary | ICD-10-CM | POA: Diagnosis not present

## 2021-06-27 DIAGNOSIS — Z9889 Other specified postprocedural states: Secondary | ICD-10-CM | POA: Diagnosis not present

## 2021-06-27 DIAGNOSIS — M25552 Pain in left hip: Secondary | ICD-10-CM | POA: Diagnosis not present

## 2021-07-03 DIAGNOSIS — Z8781 Personal history of (healed) traumatic fracture: Secondary | ICD-10-CM | POA: Diagnosis not present

## 2021-07-03 DIAGNOSIS — Z4789 Encounter for other orthopedic aftercare: Secondary | ICD-10-CM | POA: Diagnosis not present

## 2021-07-03 DIAGNOSIS — S42295D Other nondisplaced fracture of upper end of left humerus, subsequent encounter for fracture with routine healing: Secondary | ICD-10-CM | POA: Diagnosis not present

## 2021-07-09 ENCOUNTER — Ambulatory Visit: Payer: Medicare Other | Admitting: Internal Medicine

## 2021-08-08 ENCOUNTER — Telehealth: Payer: Self-pay | Admitting: Neurology

## 2021-08-08 NOTE — Telephone Encounter (Signed)
I returned the call to patient. She is currently taking baclofen for leg cramps. She would like to discuss other options at her next appt. She specifically asked about trazodone to help her sleep. She has a pending appt in January. I offered her multiple appts to come in early (in Nov and Dec). She declined the times. Stated she would like to continue her current treatment for now and discuss a medication change at a later time. I offered to speak to Dr. Krista Blue and she wanted to wait. I invited her to call back for assistance sooner if she changes her mind.

## 2021-08-08 NOTE — Telephone Encounter (Signed)
Pt called wanting to discuss alternet medications. Would like discuss with RN or Provider.

## 2021-09-16 ENCOUNTER — Other Ambulatory Visit: Payer: Self-pay | Admitting: Neurology

## 2021-09-17 ENCOUNTER — Ambulatory Visit
Admission: RE | Admit: 2021-09-17 | Discharge: 2021-09-17 | Disposition: A | Discharge status: Home / Self Care | Payer: Medicare Other | Source: Ambulatory Visit | Attending: Family Medicine | Admitting: Family Medicine

## 2021-09-17 DIAGNOSIS — Z1231 Encounter for screening mammogram for malignant neoplasm of breast: Secondary | ICD-10-CM

## 2021-10-08 DIAGNOSIS — M47816 Spondylosis without myelopathy or radiculopathy, lumbar region: Secondary | ICD-10-CM | POA: Diagnosis not present

## 2021-10-10 ENCOUNTER — Other Ambulatory Visit: Payer: Self-pay | Admitting: Family Medicine

## 2021-10-10 DIAGNOSIS — R928 Other abnormal and inconclusive findings on diagnostic imaging of breast: Secondary | ICD-10-CM

## 2021-10-29 ENCOUNTER — Ambulatory Visit: Payer: Medicare Other

## 2021-10-29 ENCOUNTER — Encounter: Payer: Self-pay | Admitting: Neurology

## 2021-10-29 ENCOUNTER — Other Ambulatory Visit: Payer: Self-pay

## 2021-10-29 ENCOUNTER — Ambulatory Visit
Admission: RE | Admit: 2021-10-29 | Discharge: 2021-10-29 | Disposition: A | Discharge status: Home / Self Care | Payer: Medicare Other | Source: Ambulatory Visit | Attending: Family Medicine | Admitting: Family Medicine

## 2021-10-29 ENCOUNTER — Ambulatory Visit: Payer: Commercial Managed Care - HMO | Admitting: Neurology

## 2021-10-29 VITALS — BP 161/90 | HR 75 | Ht 66.0 in | Wt 134.4 lb

## 2021-10-29 DIAGNOSIS — N6489 Other specified disorders of breast: Secondary | ICD-10-CM | POA: Diagnosis not present

## 2021-10-29 DIAGNOSIS — G3281 Cerebellar ataxia in diseases classified elsewhere: Secondary | ICD-10-CM

## 2021-10-29 DIAGNOSIS — R269 Unspecified abnormalities of gait and mobility: Secondary | ICD-10-CM | POA: Diagnosis not present

## 2021-10-29 DIAGNOSIS — R922 Inconclusive mammogram: Secondary | ICD-10-CM | POA: Diagnosis not present

## 2021-10-29 DIAGNOSIS — R928 Other abnormal and inconclusive findings on diagnostic imaging of breast: Secondary | ICD-10-CM

## 2021-10-29 MED ORDER — TIZANIDINE HCL 2 MG PO TABS: 2.0000 mg | ORAL_TABLET | Freq: Three times a day (TID) | ORAL | 11 refills | Status: DC | PRN

## 2021-10-29 NOTE — Progress Notes (Signed)
Chief Complaint  Patient presents with   Follow-up    Room 15. Pt alone. Pt fell last night and landed on her face. She has multiple lacerations and bruises.       ASSESSMENT AND PLAN  Kelly Singh is a 66 y.o. 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,             EMG nerve conduction study on March 27, 2021 also demonstrated widespread chronic neuropathic changes, suggestive of mild motor neuron involvement, but with her slow progressive clinical course of 10 years, is not consistent with typical motor neuron disease,             Treatment of hepatitis C with Harvoni in May 2021             MRI of the brain March 2021 showed mild age-related generalized atrophy, small vessel disease              Extensive laboratory evaluation showed no treatable etiology             Hospitalization for COVID, weakness, rhabdo 04/03/21-04/07/21,   Referred to physical therapy              Worsening low back pain, radiating pain to left lower extremity                        Evidence of moderate lumbar stenosis at L4-5,             Cymbalta 60 mg daily, gabapentin 100 mg 3 times a day            Tizanidine as needed Return to clinic as needed, she will call office if she develop new issues  DIAGNOSTIC DATA (LABS, IMAGING, TESTING) - I reviewed patient records, labs, notes, testing and imaging myself where available.   MEDICAL HISTORY:  Kelly Singh is a 66 year old female, seen in request by her primary care physician Dr. Arline Asp, Mitzi Hansen for evaluation of speech difficulty, gait abnormality.  Initial evaluation was on November 03, 2019.   Past medical history of depression, migraine headache, chronic low back pain   Around 2001, she first noticed problem with walking, she denies any previously  balance problem, was still riding a motorcycle, then began to notice unsteady gait, getting off path while walking, then began to stumble, and fall, she often tripped on obstacles, in 2001, she had a fall, resulting ankle fracture, her balance then progressively worsened, then noticed difficulty even standing still,   Since 2018, she noticed mild slurred speech, denies trouble swallowing, mild trouble with hand coordination, she denies tremor   She previously lived in [place], had extensive evaluation at that time, then moved to [place] for couple years, was evaluated by movement disorder center Dr, Thermon Leyland on November 02, 2017, she was given the diagnosis of 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, and slow progression of motor symptoms, she was given a trial of carbidopa-levodopa 25/100 mg to 1 tablet 3 times a day, she developed nausea, there was no significant improvement, she was later referred to physical therapy,   She moved to [facility] in October 2020, is referred here to establish neurological care,   She denied  a family history of gait problem,   Laboratory evaluations in January 2021 showed mild elevated alkaline phosphate 146, total bilirubin of 2.1, albumin was decreased 3.3, mild elevated AST 68, ALT 63, calcium was decreased 8.0   Nonreactive hepatitis B surface antibody, surface antigen, negative hepatitis C, elevated kappa free light chain 29.7, lambda light chain ratio 1.87   UPDATE Sep 13 2020: She came in without any assistant, complains of slow worsening slurred speech, gait abnormality, denies significant pain   She complains of leg muscle cramps in her leg and feet, also complains of low back pain, radiating pain to left hip, left leg give out underneath her sometimes, she also complained frequent bilateral lower extremity, leg and feet muscle cramps, especially at nighttime, she also complains of intermittent numbness at bilateral shin, but denies bilateral feet paresthesia,  she has urinary urgency, occasionally incontinence since 2020,   Reported abnormal x-ray of lumbar spine, showed degenerative changes, but I do not have the report   She has been treated for hepatitis C, 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[employer], in a seated position, preparing vegetable   UPDATE March 27 2021: She continue to progress slowly, complains of worsening dysarthria, mild dysphagia depend on the texture of the food, also worsening gait abnormality, fell multiple times, suffered left humeral fracture recently, planning on elective surgery tomorrow  She also complains of intermittent low back pain, frequent bilateral calf muscle cramp, difficulty sleeping at nighttime, intermittent bilateral feet paresthesia, worsening bladder incontinence, she rely on her walker, cane, wear depends now, also noticed intermittent bilateral upper extremity paresthesia, and loss of the dextrality We personally reviewed MRI of lumbar spine in April 2022, multilevel degenerative changes, most obvious at L4-5, with moderate spinal stenosis, bilateral foraminal stenosis due to disc bulging, facet hypertrophy, incidental left renal cyst 4.4 cm  Also reviewed MRI of the brain in March 2021, generalized atrophy, age disproportional, mild small vessel disease  CT of cervical spine February 2021, multilevel degenerative changes, no significant canal or foraminal narrowing  CT of the chest, small bilateral upper lobe groundglass nodule, stable from October 2020, cirrhosis with evidence of portal hypertension, left renal stone  MRI of abdomen July 2021, stable 1.0 0.7 cm subcapsular lesion of the anterior lobe of the liver, with very minimal peripheral nodular enhancement, suggestive of a benign hemangioma, mild prominence of the central common bile duct, measuring up to 7 mm in caliber, mildly heterogeneous liver parenchyma background enhancement and mild splenomegaly, in keeping with cirrhosis, trace perihepatic ascites,  sludge is seen in the gallbladder   Laboratory evaluations: CBC, hemoglobin 13.1, slight decreased WBC of 3.6, normal BMP, creatinine of 0.58, TSH, negative HIV, lactic acid, UDS was positive for marijuana and benzodiazepine, negative alcohol, within normal range ammonia level, kappa/lambda light chain, protein electrophoresis panel   X-ray of left shoulder Mar 04, 2021, distracted, displaced fracture of the surgical neck of left humerus, glenohumeral joint remains in apposition  EMG nerve conduction study today March 25, 2021 showed widespread chronic neuropathic changes, most obvious at bilateral lumbosacral dermatomes, there was also slight chronic neuropathic changes noted at right cervical myotomes, right sternocleidomastoid.  There was no significant abnormality noted at right orbicularis oculi, frontalis, thoracic paraspinals.   Update October 29, 2021, Laboratory evaluation showed no treatable etiology, normal or negative RRP, B12, folate, CK, sed rate, ferritin, copper, ANA, vitamin D, TSH    Hospitalized 04/03/21-04/07/21, was positive for COVID, had left shoulder surgery, went through inpatient rehabilitation,  She remains active at home, lives independently, fell on January 23 when she carried food for her fireplace fell face forward, with multiple face bruise,  She still drives, able to do manage daily activities such as grocery shopping, but with increased difficulty, also noticed mild voice change, denies trouble swallowing, her daughter suffered stroke,  She continues to have low back pain, is seeing pain management, had nerve ablation, which has helped her symptoms some,  PHYSICAL EXAM:   Vitals:   10/29/21 1042  BP: (!) 161/90  Pulse: 75  Weight: 134 lb 6.4 oz (61 kg)  Height: 5\' 6"  (1.676 m)   Not recorded     Body mass index is 21.69 kg/m.  PHYSICAL EXAMNIATION:  Gen: NAD, conversant, well nourised, well groomed                     Cardiovascular: Regular rate  rhythm, no peripheral edema, warm, nontender. Eyes: Conjunctivae clear without exudates or hemorrhage Neck: Supple, no carotid bruits. Pulmonary: Clear to auscultation bilaterally   NEUROLOGICAL EXAM:  MENTAL STATUS: Speech/cognition: Mild slurred speech, oriented to history taking and casual conversation   CRANIAL NERVES: CN II: Visual fields are full to confrontation. Pupils are round equal and briskly reactive to light. CN III, IV, VI: extraocular movement are normal. No ptosis. CN V: Facial sensation is intact to light touch CN VII: Face is symmetric with normal eye closure  CN VIII: Hearing is normal to causal conversation. CN IX, X: Phonation is normal. CN XI: Head turning and shoulder shrug are intact  MOTOR: No limb muscle weakness,  REFLEXES: Reflexes are 2+ and symmetric at the biceps, triceps, knees, and ankles. Plantar responses are flexor.  SENSORY: Intact to light touch, pinprick   COORDINATION: Mild to moderate finger-to-nose heel-to-shin dysmetria, mild truncal ataxia  GAIT/STANCE: Need push-up to get up from seated position, wide-based, ataxic unsteady gait  REVIEW OF SYSTEMS:  Full 14 system review of systems performed and notable only for as above All other review of systems were negative.   ALLERGIES: Allergies  Allergen Reactions   Latex Itching, Swelling, Rash and Other (See Comments)    Eats through skin; blister    Adhesive [Tape] Other (See Comments)    Plastic tape eats through skin - pls use paper tape    HOME MEDICATIONS: Current Outpatient Medications  Medication Sig Dispense Refill   acetaminophen (TYLENOL) 325 MG tablet Take 2 tablets (650 mg total) by mouth every 6 (six) hours as needed for mild pain or headache.     baclofen (LIORESAL) 10 MG tablet Take 1 tablet (10 mg total) by mouth 3 (three) times daily. 270 tablet 1   HYDROcodone-acetaminophen (NORCO/VICODIN) 5-325 MG tablet Take 1 tablet by mouth every 6 (six) hours as needed  (pain). 10 tablet 0   Multiple Vitamin (MULTIVITAMIN WITH MINERALS) TABS tablet Take 1 tablet by mouth daily. 30 tablet 11   naproxen sodium (ALEVE) 220 MG tablet Take 220 mg by mouth.     Omega-3 Fatty Acids (FISH OIL PO) Take 1 capsule by mouth daily.     ondansetron (ZOFRAN ODT) 4 MG disintegrating tablet Take 1 tablet (4 mg total) by mouth every 8 (eight) hours as needed for nausea or vomiting. 6 tablet 0   sertraline (ZOLOFT) 50 MG tablet Take 1 tablet (50 mg total) by mouth every morning. 90 tablet 1   Vitamin D, Cholecalciferol, 25 MCG (1000 UT) TABS Take 1 tablet by mouth daily.  No current facility-administered medications for this visit.    PAST MEDICAL HISTORY: Past Medical History:  Diagnosis Date   Arthritis    Ataxia    Cerebellar ataxia ([place])    Chronic hepatitis C ([place])    Cirrhosis ([place])    Depression with anxiety    Fibromyalgia    Gallbladder sludge    Hiatal hernia    MGUS (monoclonal gammopathy of unknown significance)    hx with associated anemia   Migraine    Polysubstance abuse ([place])    in 1970's   Portal venous hypertension ([place])     PAST SURGICAL HISTORY: Past Surgical History:  Procedure Laterality Date   ANKLE SURGERY Right 2011   BREAST ENHANCEMENT SURGERY  1996   SHOULDER ACROMIOPLASTY  03/2021   TUBAL LIGATION  1979   WRIST SURGERY Right 2016    FAMILY HISTORY: Family History  Problem Relation Age of Onset   Bone cancer Mother    Esophageal cancer Father     SOCIAL HISTORY: Social History   Socioeconomic History   Marital status: Single    Spouse name: Not on file   Number of children: 1   Years of education: college   Highest education level: Associate degree: occupational, Hotel manager, or vocational program  Occupational History   Occupation: prep food  Tobacco Use   Smoking status: Former    Types: Cigarettes    Quit date: 1998    Years since quitting: 25.0   Smokeless tobacco: Never  Vaping Use   Vaping Use: Never  used  Substance and Sexual Activity   Alcohol use: Yes    Comment: occasional   Drug use: Never   Sexual activity: Not on file  Other Topics Concern   Not on file  Social History Narrative   Lives at home with her granddaughter.   Left-handed.   Caffeine use:c 1-2 cups per day.   Social Determinants of Health   Financial Resource Strain: Not on file  Food Insecurity: Not on file  Transportation Needs: Not on file  Physical Activity: Not on file  Stress: Not on file  Social Connections: Not on file  Intimate Partner Violence: Not on file      Marcial Pacas, M.D. Ph.D.  [facility] [address] [place], [place] [address] Ph: [phone] Fax: [phone]  CC:  Janie Morning, [place],   [id number]  Janie Morning, DO

## 2021-11-06 ENCOUNTER — Ambulatory Visit: Payer: Medicare Other | Attending: Neurology | Admitting: Rehabilitation

## 2021-11-06 ENCOUNTER — Other Ambulatory Visit: Payer: Medicare Other

## 2021-11-06 DIAGNOSIS — R2689 Other abnormalities of gait and mobility: Secondary | ICD-10-CM | POA: Insufficient documentation

## 2021-11-06 DIAGNOSIS — R260 Ataxic gait: Secondary | ICD-10-CM | POA: Insufficient documentation

## 2021-11-08 ENCOUNTER — Ambulatory Visit: Payer: Medicare Other

## 2021-11-08 ENCOUNTER — Other Ambulatory Visit: Payer: Self-pay

## 2021-11-08 DIAGNOSIS — R260 Ataxic gait: Secondary | ICD-10-CM

## 2021-11-08 DIAGNOSIS — R2689 Other abnormalities of gait and mobility: Secondary | ICD-10-CM | POA: Diagnosis not present

## 2021-11-08 NOTE — Therapy (Signed)
[facility] [address] [place], [place], [address] Phone: [phone]   Fax:  [phone]  Physical Therapy Evaluation  Patient Details  Name: Kelly Singh MRN: [id number] Date of Birth: 1955-10-14 Referring Provider (PT): Dr. Krista Blue   Encounter Date: 11/08/2021   PT End of Session - 11/08/21 1207     Visit Number 1    Number of Visits 17    Date for PT Re-Evaluation 01/03/22    Authorization Type UHC    PT Start Time 8875    PT Stop Time 1100    PT Time Calculation (min) 45 min    Equipment Utilized During Treatment Gait belt    Activity Tolerance Patient tolerated treatment well    Behavior During Therapy WFL for tasks assessed/performed             Past Medical History:  Diagnosis Date   Arthritis    Ataxia    Cerebellar ataxia ([place])    Chronic hepatitis C ([place])    Cirrhosis ([place])    Depression with anxiety    Fibromyalgia    Gallbladder sludge    Hiatal hernia    MGUS (monoclonal gammopathy of unknown significance)    hx with associated anemia   Migraine    Polysubstance abuse ([place])    in 1970's   Portal venous hypertension ([place])     Past Surgical History:  Procedure Laterality Date   ANKLE SURGERY Right 2011   [place]   SHOULDER ACROMIOPLASTY  03/2021   TUBAL LIGATION  1979   WRIST SURGERY Right 2016    There were no vitals filed for this visit.    Subjective Assessment - 11/08/21 1023     Subjective Pt reports that stepped on ice in 2011 and slipped and broker her R ankle. Since then she started having balance issues. She was diagnosed with Spinocerebellar Ataxia in 2015 with neurologist in [place] and was confirmed with neurologist in 2019 in [place], [place]. Pt currently uses intermittent walker at home. She intermittently uses walking sticks for community mobility.    Limitations Lifting;Standing;Walking;House hold activities    Patient Stated  Goals Improve strength, improve balance, reduce fall    Currently in Pain? No/denies                [facility] PT Assessment - 11/08/21 0001       Assessment   Medical Diagnosis Spinocerebellar ataxia    Referring Provider (PT) Dr. Krista Blue    Onset Date/Surgical Date 10/31/21      Precautions   Precautions Fall      Balance Screen   Has the patient fallen in the past 6 months Yes    How many times? 12    Has the patient had a decrease in activity level because of a fear of falling?  Yes    Is the patient reluctant to leave their home because of a fear of falling?  No      Home Environment   Living Environment Private residence    Living Arrangements Alone    Type of [place] One level    [place] - 4 wheels;Other (comment);Shower seat;Grab bars - toilet;Grab bars - tub/shower   Walking sticks     Prior Function   Level of Independence Independent    Vocation On disability      Cognition   Overall Cognitive Status Within Functional Limits for  tasks assessed      ROM / Strength   AROM / PROM / Strength Strength      Strength   Overall Strength Within functional limits for tasks performed   Screened with MMT in sitting posture. Bil heel raises: 20x     Standardized Balance Assessment   Standardized Balance Assessment Berg Balance Test      Berg Balance Test   Sit to Stand Able to stand  independently using hands    Standing Unsupported Able to stand 2 minutes with supervision    Sitting with Back Unsupported but Feet Supported on Floor or Stool Able to sit safely and securely 2 minutes    Stand to Sit Uses backs of legs against chair to control descent    Transfers Able to transfer safely, definite need of hands    Standing Unsupported with Eyes Closed Able to stand 10 seconds with supervision    Standing Unsupported with Feet Together Able to place feet together independently and stand for 1 minute with supervision    From Standing,  Reach Forward with Outstretched Arm Can reach confidently >25 cm (10")    From Standing Position, Pick up Object from Floor Able to pick up shoe, needs supervision    From Standing Position, Turn to Look Behind Over each Shoulder Needs supervision when turning    Turn 360 Degrees Able to turn 360 degrees safely but slowly    Standing Unsupported, Alternately Place Feet on Step/Stool Able to complete >2 steps/needs minimal assist    Standing Unsupported, One Foot in Front Able to take small step independently and hold 30 seconds    Standing on One Leg Unable to try or needs assist to prevent fall    Total Score 34    Berg comment: High risk for fall                Patient education: Pt educated on nature of SCA. Pt educated on importance of learning compensatory strategies to continue to focus on improving function and maintaining function Pt educated on improving compliance with use of AD (walking stick) for in home mobility and community mobility reduce falls  Pt educated on stabilizing her self against chair, countertop or wall when putting on her jacket to improve stability Pt educated on using backpack instead of purse to free up her arm so she can use other arm for stability when she needs it.          PT Short Term Goals - 11/08/21 1031       PT SHORT TERM GOAL #1   Title Patient will demo improved compliance with use of AD(walking stick) to reduce number of falls (all STG to be met by 12/06/21)    Baseline Pt walked in to eval without bringing her walking sticks (eval)    Time 4    Period Weeks    Status New    Target Date 12/06/21      PT SHORT TERM GOAL #2   Title Patient will be demo 4 points improvement in BBS to reduce fall risk and improve safety    Baseline 34/50 (11/08/21)    Time 4    Period Weeks    Status New    Target Date 12/06/21      PT SHORT TERM GOAL #3   Title Pt will report at least 50% compliance with HEP to improve her balance.    Baseline  TBD    Time 4  Period Weeks    Status New    Target Date 12/06/21               PT Long Term Goals - 11/08/21 1159       PT LONG TERM GOAL #1   Title Pt will demo at least 8 points of improvement on BBS to improve standing balance, safety and reduce fall risk (all LTG due by 01/03/22)    Baseline 34/56 (11/08/21)    Time 8    Period Weeks    Status New    Target Date 01/03/22      PT LONG TERM GOAL #2   Title Pt will report no falls in 4 consecutive weeks to improve safety and decrese fall risk    Baseline Pt has had 12 falls in 6 months with average 2 falls/month with hx of multiple near falls (11/08/21)    Time 8    Period Weeks    Status New    Target Date 01/03/22      PT LONG TERM GOAL #3   Title Patient will report at least 4 points of improvement in FGA to improve balance with functional gait to reduce fall risk    Baseline TBD    Time 8    Period Weeks    Status New    Target Date 01/03/22                    Plan - 11/08/21 1201     Clinical Impression Statement Patient is a 66 y.o. female who was seen today for gait and mobility disorder due to spinocerebellar ataxia. Patient has significant history of mulitple falls within last 6 months and has significant injuries from her fall. Patient demonstrated 34/56 on berg balance scale which indicates high risk for fall. Patient requires further education to learn compensatory strategies to improve safety and reduce fall risk. patient demonstrates poor coordination, balance, gait abnormality, decreased safety awareness which is causing difficulties with functional gait and mobility to maintain independence.    Personal Factors and Comorbidities Age;Comorbidity 2;Past/Current Experience;Time since onset of injury/illness/exacerbation    Comorbidities hx of L humerus fx, hx of L ankle fx, SCA, mutliple falls    Examination-Activity Limitations Bathing;Bend;Carry;Dressing;Reach  Overhead;Stairs;Stand;Transfers;Squat    Examination-Participation Restrictions Cleaning;Community Activity;Laundry;Meal Prep;Occupation;Shop    Stability/Clinical Decision Making Stable/Uncomplicated    Clinical Decision Making Moderate    Rehab Potential Good    PT Frequency 2x / week    PT Duration 8 weeks    PT Treatment/Interventions ADLs/Self Care Home Management;Cryotherapy;Moist Heat;Gait training;Stair training;Functional mobility training;Therapeutic activities;Therapeutic exercise;Balance training;Neuromuscular re-education;Patient/family education;Orthotic Fit/Training;Manual techniques;Passive range of motion;Energy conservation;Joint Manipulations    PT Next Visit Plan Perform FGA (try with 1 walking stick for AD) and LTG, continue with education on compensatory strategies to improve safety and reduce fall, educate on improving compliance with use of 1 walking stick for balance for in home and community mobility, issue HEP    Consulted and Agree with Plan of Care Patient             Patient will benefit from skilled therapeutic intervention in order to improve the following deficits and impairments:  Abnormal gait, Decreased balance, Decreased coordination, Decreased safety awareness, Decreased mobility, Decreased endurance, Difficulty walking, Postural dysfunction  Visit Diagnosis: Other abnormalities of gait and mobility  Ataxic gait     Problem List Patient Active Problem List   Diagnosis Date Noted   COVID-19 virus infection 04/04/2021  Acute COVID-19 04/03/2021   Weakness 65/68/1275   Acute metabolic encephalopathy 17/00/1749   Closed fracture of humerus, surgical neck 03/06/2021   Polysubstance abuse ([place]) 03/06/2021   Cerebellar ataxia in diseases classified elsewhere ([place]) 09/13/2020   Chronic low back pain 09/13/2020   Slurred speech 03/08/2020   Other cirrhosis of liver ([place]) 12/05/2019   Dizzy 11/09/2019   Fall 11/09/2019   Hypomagnesemia 11/09/2019    Scalp laceration 11/09/2019   Alcohol intoxication ([place]) 11/09/2019   Hypokalemia    Gait abnormality 11/03/2019   Chronic hepatitis C without hepatic coma ([place]) 11/01/2019    Kerrie Pleasure, PT 11/08/2021, 12:07 PM  [facility] [address] [place], [place], [address] Phone: [phone]   Fax:  [phone]  Name: Kelly Singh MRN: [id number] Date of Birth: 01-24-1956

## 2021-11-13 ENCOUNTER — Ambulatory Visit: Payer: Medicare Other | Admitting: Physical Therapy

## 2021-11-21 ENCOUNTER — Ambulatory Visit: Payer: Medicare Other | Admitting: Physical Therapy

## 2021-11-26 DIAGNOSIS — G8929 Other chronic pain: Secondary | ICD-10-CM | POA: Diagnosis not present

## 2021-11-26 DIAGNOSIS — Z9889 Other specified postprocedural states: Secondary | ICD-10-CM | POA: Diagnosis not present

## 2021-11-26 DIAGNOSIS — M545 Low back pain, unspecified: Secondary | ICD-10-CM | POA: Diagnosis not present

## 2021-11-26 DIAGNOSIS — Z8781 Personal history of (healed) traumatic fracture: Secondary | ICD-10-CM | POA: Diagnosis not present

## 2021-11-27 ENCOUNTER — Encounter: Payer: Self-pay | Admitting: Physical Therapy

## 2021-11-27 ENCOUNTER — Ambulatory Visit: Payer: Medicare Other | Admitting: Physical Therapy

## 2021-11-27 ENCOUNTER — Other Ambulatory Visit: Payer: Self-pay

## 2021-11-27 VITALS — BP 130/75 | HR 62

## 2021-11-27 DIAGNOSIS — R260 Ataxic gait: Secondary | ICD-10-CM

## 2021-11-27 DIAGNOSIS — R2689 Other abnormalities of gait and mobility: Secondary | ICD-10-CM | POA: Diagnosis not present

## 2021-11-27 NOTE — Patient Instructions (Signed)
Access Code: [email or web address] URL: [email or web address] Date: 11/27/2021 Prepared by: Willow Ora  Exercises Seated Hamstring Stretch - 1 x daily - 5 x weekly - 1 sets - 3 reps - 30 seconds hold Sit to Stand with Counter Support - 1 x daily - 5 x weekly - 1 sets - 10 reps

## 2021-11-28 NOTE — Therapy (Signed)
[facility] [address] [place], [place], [address] Phone: [phone]   Fax:  [phone]  Physical Therapy Treatment  Patient Details  Name: Kelly Singh MRN: [id number] Date of Birth: Nov 26, 1955 Referring Provider (PT): Dr. Krista Blue   Encounter Date: 11/27/2021   PT End of Session - 11/27/21 1458     Visit Number 2    Number of Visits 17    Date for PT Re-Evaluation 01/03/22    Authorization Type UHC    PT Start Time 6378    PT Stop Time 1530    PT Time Calculation (min) 38 min    Equipment Utilized During Treatment Gait belt    Activity Tolerance Patient tolerated treatment well    Behavior During Therapy WFL for tasks assessed/performed             Past Medical History:  Diagnosis Date   Arthritis    Ataxia    Cerebellar ataxia ([place])    Chronic hepatitis C ([place])    Cirrhosis ([place])    Depression with anxiety    Fibromyalgia    Gallbladder sludge    Hiatal hernia    MGUS (monoclonal gammopathy of unknown significance)    hx with associated anemia   Migraine    Polysubstance abuse ([place])    in 1970's   Portal venous hypertension ([place])     Past Surgical History:  Procedure Laterality Date   ANKLE SURGERY Right 2011   [place] ACROMIOPLASTY  03/2021   TUBAL LIGATION  1979   WRIST SURGERY Right 2016    Vitals:   11/27/21 1454  BP: 130/75  Pulse: 62     Subjective Assessment - 11/27/21 1454     Subjective No new complaints. Using walking stick on arrival today. Reports multiple falls and chronic low back pain. Did take some pain medication for back pain and leg cramps.    Limitations Lifting;Standing;Walking;House hold activities    Patient Stated Goals Improve strength, improve balance, reduce fall    Currently in Pain? Yes    Pain Score 8     Pain Location Back    Pain Orientation Other (Comment)   centralized in lower back   Pain Descriptors /  Indicators Aching;Spasm;Tightness    Pain Type Chronic pain    Pain Onset More than a month ago    Pain Frequency Constant    Aggravating Factors  immobility- prolonged sitting and standing    Pain Relieving Factors medication, heat (helps back and to relax legs                [facility] PT Assessment - 11/27/21 1459       Functional Gait  Assessment   Gait assessed  Yes    Gait Level Surface Walks 20 ft, slow speed, abnormal gait pattern, evidence for imbalance or deviates 10-15 in outside of the 12 in walkway width. Requires more than 7 sec to ambulate 20 ft.   8.07 sec's   Change in Gait Speed Able to change speed, demonstrates mild gait deviations, deviates 6-10 in outside of the 12 in walkway width, or no gait deviations, unable to achieve a major change in velocity, or uses a change in velocity, or uses an assistive device.    Gait with Horizontal Head Turns Performs head turns smoothly with slight change in gait velocity (eg, minor disruption to smooth gait path), deviates 6-10 in outside 12 in walkway width,  or uses an assistive device.    Gait with Vertical Head Turns Performs task with moderate change in gait velocity, slows down, deviates 10-15 in outside 12 in walkway width but recovers, can continue to walk.    Gait and Pivot Turn Pivot turns safely in greater than 3 sec and stops with no loss of balance, or pivot turns safely within 3 sec and stops with mild imbalance, requires small steps to catch balance.    Step Over Obstacle Is able to step over one shoe box (4.5 in total height) but must slow down and adjust steps to clear box safely. May require verbal cueing.    Gait with Narrow Base of Support Ambulates less than 4 steps heel to toe or cannot perform without assistance.    Gait with Eyes Closed Walks 20 ft, slow speed, abnormal gait pattern, evidence for imbalance, deviates 10-15 in outside 12 in walkway width. Requires more than 9 sec to ambulate 20 ft.   13.44 sec's    Ambulating Backwards Walks 20 ft, slow speed, abnormal gait pattern, evidence for imbalance, deviates 10-15 in outside 12 in walkway width.   21.84   Steps Alternating feet, must use rail.   increased time with descending needed   Total Score 13    FGA comment: <19 high fall risk. walking stick used with testing.                    [facility] Adult PT Treatment/Exercise - 11/27/21 1459       Transfers   Transfers Sit to Stand;Stand to Sit    Sit to Stand 5: Supervision;From bed;From chair/3-in-1;With upper extremity assist    Stand to Sit 5: Supervision;With upper extremity assist;To bed;To chair/3-in-1      Ambulation/Gait   Ambulation/Gait Yes    Ambulation/Gait Assistance 5: Supervision;4: Min guard    Ambulation/Gait Assistance Details pt with veering at times due to ataxic gait    Ambulation Distance (Feet) --   around clinic with session   Assistive device Other (Comment)   walking stick   Gait Pattern Step-through pattern;Ataxic;Decreased stride length    Ambulation Surface Level;Indoor    Stairs Yes    Stairs Assistance 5: Supervision    Stairs Assistance Details (indicate cue type and reason) increased time needed with descending stairs with bil UE support.    Stair Management Technique Two rails;Alternating pattern;Forwards    Number of Stairs 4   x1 rep   Height of Stairs 6      Exercises   Exercises Other Exercises    Other Exercises  issued HEP. Refer to [place] for full details. Cues on form/technique            Issued to HEP this session: Access Code: [email or web address] URL: [email or web address] Date: 11/27/2021 Prepared by: Willow Ora  Exercises Seated Hamstring Stretch - 1 x daily - 5 x weekly - 1 sets - 3 reps - 30 seconds hold Sit to Stand with Counter Support - 1 x daily - 5 x weekly - 1 sets - 10 reps      PT Education - 11/27/21 1645     Education Details results of FGA; initial HEP    Person(s) Educated Patient    Methods  Explanation;Demonstration;Verbal cues;Handout    Comprehension Verbalized understanding;Returned demonstration;Verbal cues required;Tactile cues required;Need further instruction              PT Short Term Goals - 11/08/21 1031  PT SHORT TERM GOAL #1   Title Patient will demo improved compliance with use of AD(walking stick) to reduce number of falls (all STG to be met by 12/06/21)    Baseline Pt walked in to eval without bringing her walking sticks (eval)    Time 4    Period Weeks    Status New    Target Date 12/06/21      PT SHORT TERM GOAL #2   Title Patient will be demo 4 points improvement in BBS to reduce fall risk and improve safety    Baseline 34/50 (11/08/21)    Time 4    Period Weeks    Status New    Target Date 12/06/21      PT SHORT TERM GOAL #3   Title Pt will report at least 50% compliance with HEP to improve her balance.    Baseline TBD    Time 4    Period Weeks    Status New    Target Date 12/06/21               PT Long Term Goals - 11/08/21 1159       PT LONG TERM GOAL #1   Title Pt will demo at least 8 points of improvement on BBS to improve standing balance, safety and reduce fall risk (all LTG due by 01/03/22)    Baseline 34/56 (11/08/21)    Time 8    Period Weeks    Status New    Target Date 01/03/22      PT LONG TERM GOAL #2   Title Pt will report no falls in 4 consecutive weeks to improve safety and decrese fall risk    Baseline Pt has had 12 falls in 6 months with average 2 falls/month with hx of multiple near falls (11/08/21)    Time 8    Period Weeks    Status New    Target Date 01/03/22      PT LONG TERM GOAL #3   Title Patient will report at least 4 points of improvement in FGA to improve balance with functional gait to reduce fall risk    Baseline TBD    Time 8    Period Weeks    Status New    Target Date 01/03/22                   Plan - 11/27/21 1458     Clinical Impression Statement Today's skilled session  initially focused on performance of FGA to set baseline with score of 13/30, <19 is high fall risk. Remainder of session began to establish an HEP for strengtheing/stretching. Will plan to add balance ex's at next session. The pt should benefit from continued PT to progress toward unmet goals.    Personal Factors and Comorbidities Age;Comorbidity 2;Past/Current Experience;Time since onset of injury/illness/exacerbation    Comorbidities hx of L humerus fx, hx of L ankle fx, SCA, mutliple falls    Examination-Activity Limitations Bathing;Bend;Carry;Dressing;Reach Overhead;Stairs;Stand;Transfers;Squat    Examination-Participation Restrictions Cleaning;Community Activity;Laundry;Meal Prep;Occupation;Shop    Stability/Clinical Decision Making Stable/Uncomplicated    Rehab Potential Good    PT Frequency 2x / week    PT Duration 8 weeks    PT Treatment/Interventions ADLs/Self Care Home Management;Cryotherapy;Moist Heat;Gait training;Stair training;Functional mobility training;Therapeutic activities;Therapeutic exercise;Balance training;Neuromuscular re-education;Patient/family education;Orthotic Fit/Training;Manual techniques;Passive range of motion;Energy conservation;Joint Manipulations    PT Next Visit Plan add balance ex's to HEP.  continue with education on compensatory strategies to improve safety and reduce  fall, educate on improving compliance with use of 1 walking stick for balance for in home and community mobility    PT Home Exercise Plan Access Code: [email or web address]    Consulted and Agree with Plan of Care Patient             Patient will benefit from skilled therapeutic intervention in order to improve the following deficits and impairments:  Abnormal gait, Decreased balance, Decreased coordination, Decreased safety awareness, Decreased mobility, Decreased endurance, Difficulty walking, Postural dysfunction  Visit Diagnosis: Other abnormalities of gait and mobility  Ataxic  gait     Problem List Patient Active Problem List   Diagnosis Date Noted   COVID-19 virus infection 04/04/2021   Acute COVID-19 04/03/2021   Weakness 56/09/5482   Acute metabolic encephalopathy 23/46/8873   Closed fracture of humerus, surgical neck 03/06/2021   Polysubstance abuse ([place]) 03/06/2021   Cerebellar ataxia in diseases classified elsewhere ([place]) 09/13/2020   Chronic low back pain 09/13/2020   Slurred speech 03/08/2020   Other cirrhosis of liver ([place]) 12/05/2019   Dizzy 11/09/2019   Fall 11/09/2019   Hypomagnesemia 11/09/2019   Scalp laceration 11/09/2019   Alcohol intoxication ([place]) 11/09/2019   Hypokalemia    Gait abnormality 11/03/2019   Chronic hepatitis C without hepatic coma ([place]) 11/01/2019    Willow Ora, PTA, [facility] [address], [place], [place] [address] [phone] 11/28/21, 3:03 PM   Name: SUHAYLA CHISOM MRN: [id number] Date of Birth: August 12, 1956

## 2021-11-29 ENCOUNTER — Ambulatory Visit: Payer: Medicare Other

## 2021-12-04 ENCOUNTER — Ambulatory Visit: Payer: Medicare Other | Attending: Family Medicine | Admitting: Physical Therapy

## 2021-12-06 ENCOUNTER — Ambulatory Visit: Payer: Medicare Other

## 2021-12-11 ENCOUNTER — Ambulatory Visit: Payer: Medicare Other

## 2021-12-11 DIAGNOSIS — M5136 Other intervertebral disc degeneration, lumbar region: Secondary | ICD-10-CM | POA: Diagnosis not present

## 2021-12-13 ENCOUNTER — Inpatient Hospital Stay: Payer: Medicare Other | Attending: Family Medicine

## 2021-12-13 ENCOUNTER — Ambulatory Visit: Payer: Medicare Other | Admitting: Physical Therapy

## 2021-12-18 ENCOUNTER — Ambulatory Visit: Payer: Medicare Other | Admitting: Physical Therapy

## 2021-12-19 ENCOUNTER — Telehealth: Payer: Self-pay | Admitting: Oncology

## 2021-12-19 NOTE — Telephone Encounter (Signed)
Called patient regarding 03/17 appointment, voicemail was left. ?

## 2021-12-20 ENCOUNTER — Telehealth: Payer: Self-pay | Admitting: Oncology

## 2021-12-20 ENCOUNTER — Other Ambulatory Visit: Payer: Medicare Other

## 2021-12-20 ENCOUNTER — Inpatient Hospital Stay: Payer: Medicare Other | Admitting: Oncology

## 2021-12-20 ENCOUNTER — Ambulatory Visit: Payer: Medicare Other

## 2021-12-20 ENCOUNTER — Ambulatory Visit: Payer: Medicare HMO | Admitting: Oncology

## 2021-12-20 NOTE — Telephone Encounter (Signed)
.  Called pt per 3/17 inbasket , Patient was unavailable, a message with appt time and date was left with pt and pt daughters number on file.   ?

## 2021-12-23 ENCOUNTER — Telehealth: Payer: Self-pay | Admitting: Oncology

## 2021-12-23 NOTE — Telephone Encounter (Signed)
.  Called pt per 3/17 inbasket , Patient was unavailable, a message with appt time and date was left with number on file.   ?

## 2021-12-25 ENCOUNTER — Ambulatory Visit: Payer: Medicare Other | Admitting: Physical Therapy

## 2021-12-27 ENCOUNTER — Ambulatory Visit: Payer: Medicare Other

## 2022-01-01 ENCOUNTER — Ambulatory Visit: Payer: Medicare Other | Admitting: Physical Therapy

## 2022-01-03 ENCOUNTER — Ambulatory Visit: Payer: Medicare Other

## 2022-01-08 ENCOUNTER — Ambulatory Visit: Payer: Medicare Other | Admitting: Physical Therapy

## 2022-01-10 ENCOUNTER — Ambulatory Visit: Payer: Medicare Other | Admitting: Physical Therapy

## 2022-01-30 ENCOUNTER — Other Ambulatory Visit: Payer: Self-pay

## 2022-01-30 ENCOUNTER — Inpatient Hospital Stay: Payer: Medicare Other | Attending: Family Medicine

## 2022-01-30 DIAGNOSIS — R768 Other specified abnormal immunological findings in serum: Secondary | ICD-10-CM | POA: Diagnosis not present

## 2022-01-30 DIAGNOSIS — Z79899 Other long term (current) drug therapy: Secondary | ICD-10-CM | POA: Diagnosis not present

## 2022-01-30 DIAGNOSIS — Z888 Allergy status to other drugs, medicaments and biological substances status: Secondary | ICD-10-CM | POA: Diagnosis not present

## 2022-01-30 DIAGNOSIS — D6959 Other secondary thrombocytopenia: Secondary | ICD-10-CM | POA: Diagnosis not present

## 2022-01-30 DIAGNOSIS — D472 Monoclonal gammopathy: Secondary | ICD-10-CM | POA: Diagnosis not present

## 2022-01-30 DIAGNOSIS — K7460 Unspecified cirrhosis of liver: Secondary | ICD-10-CM | POA: Diagnosis not present

## 2022-01-30 LAB — CMP (CANCER CENTER ONLY)
ALT: 15 U/L (ref 0–44)
AST: 17 U/L (ref 15–41)
Albumin: 4.1 g/dL (ref 3.5–5.0)
Alkaline Phosphatase: 112 U/L (ref 38–126)
Anion gap: 2 — ABNORMAL LOW (ref 5–15)
BUN: 14 mg/dL (ref 8–23)
CO2: 32 mmol/L (ref 22–32)
Calcium: 9.2 mg/dL (ref 8.9–10.3)
Chloride: 105 mmol/L (ref 98–111)
Creatinine: 0.65 mg/dL (ref 0.44–1.00)
GFR, Estimated: >60 mL/min (ref >60)
Glucose, Bld: 96 mg/dL (ref 70–99)
Potassium: 4.3 mmol/L (ref 3.5–5.1)
Sodium: 139 mmol/L (ref 135–145)
Total Bilirubin: 0.8 mg/dL (ref 0.3–1.2)
Total Protein: 7.5 g/dL (ref 6.5–8.1)

## 2022-01-30 LAB — CBC WITH DIFFERENTIAL (CANCER CENTER ONLY)
Abs Immature Granulocytes: 0.01 10*3/uL (ref 0.00–0.07)
Basophils Absolute: 0 10*3/uL (ref 0.0–0.1)
Basophils Relative: 1 %
Eosinophils Absolute: 0.1 10*3/uL (ref 0.0–0.5)
Eosinophils Relative: 3 %
HCT: 45.3 % (ref 36.0–46.0)
Hemoglobin: 14.9 g/dL (ref 12.0–15.0)
Immature Granulocytes: 0 %
Lymphocytes Relative: 28 %
Lymphs Abs: 1.2 10*3/uL (ref 0.7–4.0)
MCH: 31.4 pg (ref 26.0–34.0)
MCHC: 32.9 g/dL (ref 30.0–36.0)
MCV: 95.6 fL (ref 80.0–100.0)
Monocytes Absolute: 0.2 10*3/uL (ref 0.1–1.0)
Monocytes Relative: 6 %
Neutro Abs: 2.6 10*3/uL (ref 1.7–7.7)
Neutrophils Relative %: 62 %
Platelet Count: 145 10*3/uL — ABNORMAL LOW (ref 150–400)
RBC: 4.74 MIL/uL (ref 3.87–5.11)
RDW: 13.4 % (ref 11.5–15.5)
WBC Count: 4.1 10*3/uL (ref 4.0–10.5)
nRBC: 0 % (ref 0.0–0.2)

## 2022-01-31 LAB — KAPPA/LAMBDA LIGHT CHAINS
Kappa free light chain: 15.1 mg/L (ref 3.3–19.4)
Kappa, lambda light chain ratio: 2.25 — ABNORMAL HIGH (ref 0.26–1.65)
Lambda free light chains: 6.7 mg/L (ref 5.7–26.3)

## 2022-02-03 LAB — MULTIPLE MYELOMA PANEL, SERUM
Albumin SerPl Elph-Mcnc: 3.8 g/dL (ref 2.9–4.4)
Albumin/Glob SerPl: 1.2 (ref 0.7–1.7)
Alpha 1: 0.2 g/dL (ref 0.0–0.4)
Alpha2 Glob SerPl Elph-Mcnc: 0.6 g/dL (ref 0.4–1.0)
B-Globulin SerPl Elph-Mcnc: 0.8 g/dL (ref 0.7–1.3)
Gamma Glob SerPl Elph-Mcnc: 1.6 g/dL (ref 0.4–1.8)
Globulin, Total: 3.2 g/dL (ref 2.2–3.9)
IgA: 153 mg/dL (ref 87–352)
IgG (Immunoglobin G), Serum: 1646 mg/dL — ABNORMAL HIGH (ref 586–1602)
IgM (Immunoglobulin M), Srm: 83 mg/dL (ref 26–217)
M Protein SerPl Elph-Mcnc: 0.7 g/dL — ABNORMAL HIGH
Total Protein ELP: 7 g/dL (ref 6.0–8.5)

## 2022-02-06 ENCOUNTER — Other Ambulatory Visit: Payer: Self-pay

## 2022-02-06 ENCOUNTER — Inpatient Hospital Stay: Payer: Medicare Other | Attending: Family Medicine | Admitting: Oncology

## 2022-02-06 VITALS — BP 181/92 | HR 72 | Temp 98.1°F | Resp 15 | Wt 133.8 lb

## 2022-02-06 DIAGNOSIS — D472 Monoclonal gammopathy: Secondary | ICD-10-CM | POA: Diagnosis not present

## 2022-02-06 DIAGNOSIS — K7460 Unspecified cirrhosis of liver: Secondary | ICD-10-CM | POA: Diagnosis not present

## 2022-02-06 DIAGNOSIS — Z886 Allergy status to analgesic agent status: Secondary | ICD-10-CM | POA: Insufficient documentation

## 2022-02-06 DIAGNOSIS — K7689 Other specified diseases of liver: Secondary | ICD-10-CM | POA: Diagnosis not present

## 2022-02-06 DIAGNOSIS — D6959 Other secondary thrombocytopenia: Secondary | ICD-10-CM | POA: Insufficient documentation

## 2022-02-06 DIAGNOSIS — R270 Ataxia, unspecified: Secondary | ICD-10-CM | POA: Diagnosis not present

## 2022-02-06 DIAGNOSIS — Z79899 Other long term (current) drug therapy: Secondary | ICD-10-CM | POA: Diagnosis not present

## 2022-02-06 NOTE — Progress Notes (Signed)
Hematology and Oncology Follow Up Visit ? ?Kelly Singh ?[id number] ?06-13-1956 66 y.o. ?02/06/2022 12:25 PM ?Kelly Russell, DO  ? ?Principle Diagnosis: 66 year old woman with biclonal gammopathy with elevated IgG  diagnosed in 2011.  Differential diagnosis that includes a reactive finding related to liver disease versus a plasma cell disorder. ? ?Secondary diagnosis: Thrombocytopenia related to cirrhosis of the liver. ? ? ?Current therapy: Active surveillance. ? ?Interim History: Kelly Singh returns today for a follow-up visit.  Since last visit, she reports no major changes in her health.  She continues to be ambulatory with the help of a cane with occasional falls and unsteadiness related to her ataxia condition.  She denies any fevers, chills or sweats.  He denies any hospitalizations or illnesses.  She denies any bone pain or pathological fractures. ?  ? ? ? ?Medications: Reviewed without changes. ?Current Outpatient Medications  ?Medication Sig Dispense Refill  ? acetaminophen (TYLENOL) 325 MG tablet Take 2 tablets (650 mg total) by mouth every 6 (six) hours as needed for mild pain or headache.    ? baclofen (LIORESAL) 10 MG tablet Take 1 tablet (10 mg total) by mouth 3 (three) times daily. 270 tablet 1  ? HYDROcodone-acetaminophen (NORCO/VICODIN) 5-325 MG tablet Take 1 tablet by mouth every 6 (six) hours as needed (pain). 10 tablet 0  ? Multiple Vitamin (MULTIVITAMIN WITH MINERALS) TABS tablet Take 1 tablet by mouth daily. 30 tablet 11  ? naproxen sodium (ALEVE) 220 MG tablet Take 220 mg by mouth.    ? Omega-3 Fatty Acids (FISH OIL PO) Take 1 capsule by mouth daily.    ? ondansetron (ZOFRAN ODT) 4 MG disintegrating tablet Take 1 tablet (4 mg total) by mouth every 8 (eight) hours as needed for nausea or vomiting. 6 tablet 0  ? sertraline (ZOLOFT) 50 MG tablet Take 1 tablet (50 mg total) by mouth every morning. 90 tablet 1  ? tiZANidine (ZANAFLEX) 2 MG tablet Take 1 tablet (2 mg total) by  mouth every 8 (eight) hours as needed for muscle spasms. 60 tablet 11  ? Vitamin D, Cholecalciferol, 25 MCG (1000 UT) TABS Take 1 tablet by mouth daily.    ? ?No current facility-administered medications for this visit.  ? ? ? ?Allergies:  ?Allergies  ?Allergen Reactions  ? Latex Itching, Swelling, Rash and Other (See Comments)  ?  Eats through skin; blister ?  ? Adhesive [Tape] Other (See Comments)  ?  Plastic tape eats through skin - pls use paper tape  ? ? ? ?Physical Exam: ? ?Blood pressure (!) 181/92, pulse 72, temperature 98.1 ?F (36.7 ?C), temperature source Tympanic, resp. rate 15, weight 133 lb 12.8 oz (60.7 kg), SpO2 100 %. ? ? ?ECOG: 1 ? ? ?General appearance: Comfortable appearing without any discomfort ?Head: Normocephalic without any trauma ?Oropharynx: Mucous membranes are moist and pink without any thrush or ulcers. ?Eyes: Pupils are equal and round reactive to light. ?Lymph nodes: No cervical, supraclavicular, inguinal or axillary lymphadenopathy.   ?Heart:regular rate and rhythm.  S1 and S2 without leg edema. ?Lung: Clear without any rhonchi or wheezes.  No dullness to percussion. ?Abdomin: Soft, nontender, nondistended with good bowel sounds.  No hepatosplenomegaly. ?Musculoskeletal: No joint deformity or effusion.  Full range of motion noted. ?Neurological: No deficits noted on motor, sensory and deep tendon reflex exam. ?Skin: No petechial rash or dryness.  Appeared moist.  ? ? ? ? ? ? ?Lab Results: ?Lab Results  ?Component Value Date  ? WBC  4.1 01/30/2022  ? HGB 14.9 01/30/2022  ? HCT 45.3 01/30/2022  ? MCV 95.6 01/30/2022  ? PLT 145 (L) 01/30/2022  ? ?  Chemistry   ?   ?Component Value Date/Time  ? NA 139 01/30/2022 1339  ? K 4.3 01/30/2022 1339  ? CL 105 01/30/2022 1339  ? CO2 32 01/30/2022 1339  ? BUN 14 01/30/2022 1339  ? CREATININE 0.65 01/30/2022 1339  ? CREATININE 0.60 11/16/2020 1008  ?    ?Component Value Date/Time  ? CALCIUM 9.2 01/30/2022 1339  ? ALKPHOS 112 01/30/2022 1339  ? AST 17  01/30/2022 1339  ? ALT 15 01/30/2022 1339  ? ALT 63 (H) 11/01/2019 0955  ? BILITOT 0.8 01/30/2022 1339  ?  ? ? ? ? Latest Reference Range & Units 12/07/20 13:53 01/30/22 13:39  ?M Protein SerPl Elph-Mcnc Not Observed g/dL 0.5 (H) (C) 0.7 (H) (C)  ?IFE 1  Comment ! (C) Comment ! (C)  ?Globulin, Total 2.2 - 3.9 g/dL 3.0 (C) 3.2 (C)  ?B-Globulin SerPl Elph-Mcnc 0.7 - 1.3 g/dL 0.6 (L) (C) 0.8 (C)  ?IgG (Immunoglobin G), Serum 586 - 1,602 mg/dL 1,934 (H) 1,646 (H)  ?IgM (Immunoglobulin M), Srm 26 - 217 mg/dL 86 83  ?IgA 87 - 352 mg/dL 167 153  ? ? Latest Reference Range & Units 10/13/19 12:00 01/11/20 08:15 12/07/20 13:53 01/30/22 13:39  ?Kappa free light chain 3.3 - 19.4 mg/L 29.7 (H) 34.4 (H) 19.8 (H) 15.1  ?Lambda free light chains 5.7 - 26.3 mg/L 15.9 26.4 (H) 13.5 6.7  ?Kappa, lambda light chain ratio 0.26 - 1.65  1.87 (H) 1.30 1.47 2.25 (H)  ?(H): Data is abnormally high ? ? ?Impression and Plan: ? ?66 year old woman with: ? ?1.  Biclonal IgG protein with lambda specificity noted since 2011.  This is related to plasma cell disorder versus reactive findings. ? ?Laboratory data from January 30, 2022 were personally reviewed.  Her M spike continues to be under 1 g/dL with the mild elevation in her IgG level.  She does have mild elevation in the kappa to lambda ratio.  Remaining labs do not indicate any evidence of endorgan damage with normal hematological parameters as well as normal kidney function and protein levels.  At this time, I do not recommend any further work-up but rather annual monitoring. ? ? ?2.  Thrombocytopenia: Mild and asymptomatic at this time without any further intervention needed.  This is likely related to her liver disease. ? ?3.  Focal hepatic lesion detected on MRI on in March 2021.  Repeat imaging in August 2022 showed less concerning findings. ? ?4. Follow-up: In 1 year for repeat follow-up. ? ?30  minutes were dedicated to this visit.  The time was spent on reviewing laboratory data, disease  status update and outlining future plan of care discussion. ? ? ?Zola Button, MD ?5/4/202312:25 PM ? ?

## 2022-02-17 DIAGNOSIS — G8929 Other chronic pain: Secondary | ICD-10-CM | POA: Diagnosis not present

## 2022-02-17 DIAGNOSIS — M5031 Other cervical disc degeneration,  high cervical region: Secondary | ICD-10-CM | POA: Diagnosis not present

## 2022-02-17 DIAGNOSIS — M546 Pain in thoracic spine: Secondary | ICD-10-CM | POA: Diagnosis not present

## 2022-02-17 DIAGNOSIS — M47819 Spondylosis without myelopathy or radiculopathy, site unspecified: Secondary | ICD-10-CM | POA: Diagnosis not present

## 2022-02-17 DIAGNOSIS — M542 Cervicalgia: Secondary | ICD-10-CM | POA: Diagnosis not present

## 2022-02-17 DIAGNOSIS — M519 Unspecified thoracic, thoracolumbar and lumbosacral intervertebral disc disorder: Secondary | ICD-10-CM | POA: Diagnosis not present

## 2022-02-17 DIAGNOSIS — M5136 Other intervertebral disc degeneration, lumbar region: Secondary | ICD-10-CM | POA: Diagnosis not present

## 2022-03-24 ENCOUNTER — Encounter: Payer: Self-pay | Admitting: Internal Medicine

## 2022-03-26 ENCOUNTER — Encounter: Payer: Self-pay | Admitting: Internal Medicine

## 2022-04-02 DIAGNOSIS — M47814 Spondylosis without myelopathy or radiculopathy, thoracic region: Secondary | ICD-10-CM | POA: Diagnosis not present

## 2022-04-29 DIAGNOSIS — F1120 Opioid dependence, uncomplicated: Secondary | ICD-10-CM | POA: Diagnosis not present

## 2022-04-29 DIAGNOSIS — M5136 Other intervertebral disc degeneration, lumbar region: Secondary | ICD-10-CM | POA: Diagnosis not present

## 2022-04-29 DIAGNOSIS — M546 Pain in thoracic spine: Secondary | ICD-10-CM | POA: Diagnosis not present

## 2022-04-29 DIAGNOSIS — Z79891 Long term (current) use of opiate analgesic: Secondary | ICD-10-CM | POA: Diagnosis not present

## 2022-04-29 DIAGNOSIS — M542 Cervicalgia: Secondary | ICD-10-CM | POA: Diagnosis not present

## 2022-04-29 DIAGNOSIS — M48061 Spinal stenosis, lumbar region without neurogenic claudication: Secondary | ICD-10-CM | POA: Diagnosis not present

## 2022-04-29 DIAGNOSIS — G8929 Other chronic pain: Secondary | ICD-10-CM | POA: Diagnosis not present

## 2022-04-29 DIAGNOSIS — M47814 Spondylosis without myelopathy or radiculopathy, thoracic region: Secondary | ICD-10-CM | POA: Diagnosis not present

## 2022-05-02 ENCOUNTER — Encounter: Payer: Self-pay | Admitting: Internal Medicine

## 2022-05-14 ENCOUNTER — Other Ambulatory Visit: Payer: Self-pay

## 2022-05-14 DIAGNOSIS — K7460 Unspecified cirrhosis of liver: Secondary | ICD-10-CM

## 2022-06-16 ENCOUNTER — Telehealth: Payer: Self-pay | Admitting: Neurology

## 2022-06-16 NOTE — Telephone Encounter (Signed)
Pt is calling. I put a refill. Pt then ask after if medication tiZANidine (ZANAFLEX) 2 MG tablet could be increased to '4mg'$ 

## 2022-06-16 NOTE — Telephone Encounter (Signed)
Edmondson, Sharitta L 14 minutes ago (3:42 PM)   SE Pt is calling. I put a refill. Pt then ask after if medication tiZANidine (ZANAFLEX) 2 MG tablet could be increased to '4mg'$ 

## 2022-06-16 NOTE — Telephone Encounter (Signed)
Mychart message sent to the pt on this. 

## 2022-06-16 NOTE — Telephone Encounter (Signed)
Duplicate message. 

## 2022-06-16 NOTE — Telephone Encounter (Signed)
Pt is calling. Requesting a refill on  sertraline (ZOLOFT) 50 MG tablet and tiZANidine (ZANAFLEX) 2 MG tablet. Pt is requesting prescription be sent to [id number]

## 2022-06-18 MED ORDER — TIZANIDINE HCL 2 MG PO TABS: 2.0000 mg | ORAL_TABLET | Freq: Three times a day (TID) | ORAL | 11 refills | Status: AC | PRN

## 2022-06-18 MED ORDER — SERTRALINE HCL 50 MG PO TABS: 50.0000 mg | ORAL_TABLET | Freq: Every morning | ORAL | 1 refills | Status: DC

## 2022-06-18 NOTE — Telephone Encounter (Signed)
I have refilled meds as rx'd. Last f/u was in January if new dosages of meds are needed f/u would be needed to discuss.

## 2022-06-18 NOTE — Addendum Note (Signed)
Addended by: Verlin Grills on: 06/18/2022 08:32 AM   Modules accepted: Orders

## 2022-08-20 IMAGING — MG DIGITAL SCREENING BREAST BILAT IMPLANT W/ TOMO W/ CAD
8 of 12 series · 8 of 28 positions shown · non-contrast
Comparison: None.
COMPARISON: None.

Addendum:
CLINICAL DATA: Screening.

EXAM:
DIGITAL SCREENING BILATERAL MAMMOGRAM WITH IMPLANTS, CAD AND
TOMOSYNTHESIS
TECHNIQUE: Bilateral screening digital craniocaudal and mediolateral oblique
mammograms were obtained. Bilateral screening digital breast
tomosynthesis was performed. The images were evaluated with
computer-aided detection. Standard and/or implant displaced views
were performed.

[L CC]
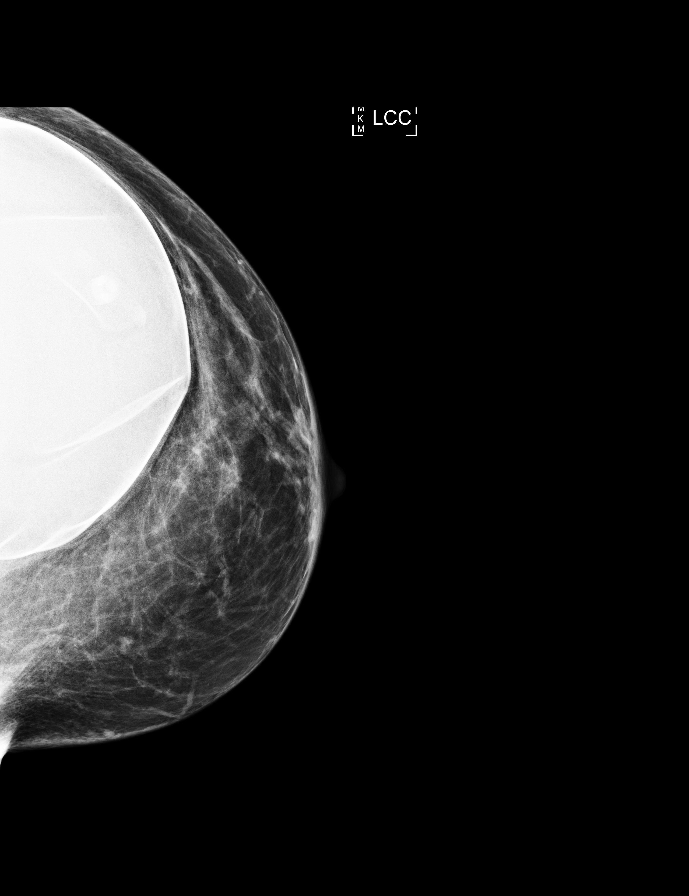

[R MLO]
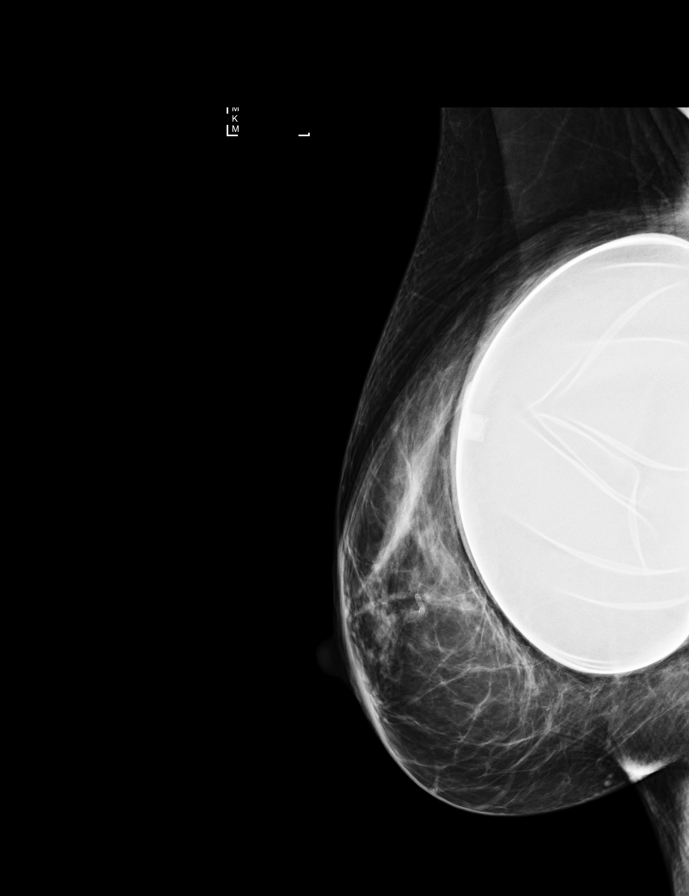

[R CC]
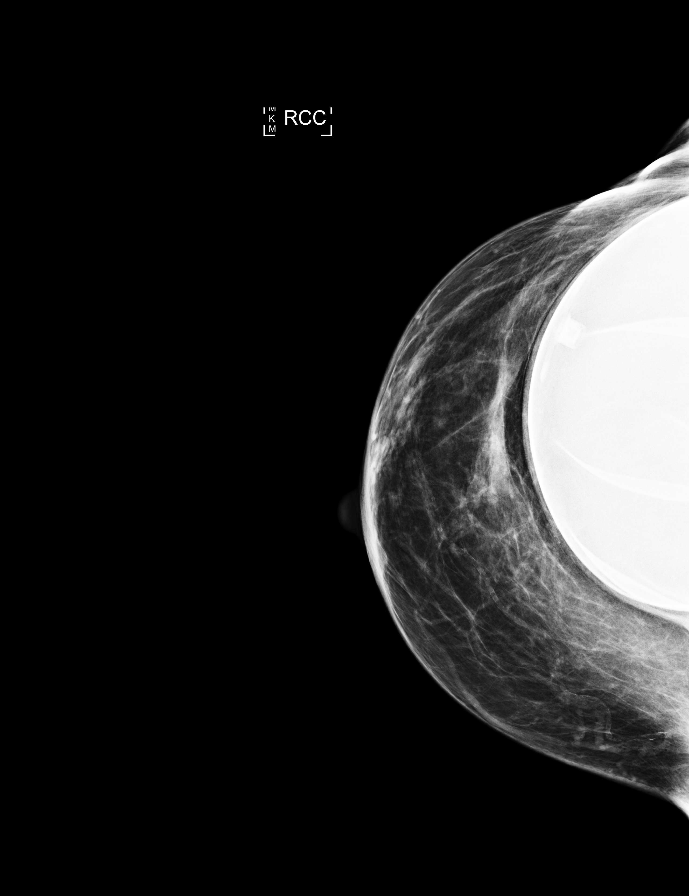

[L MLO]
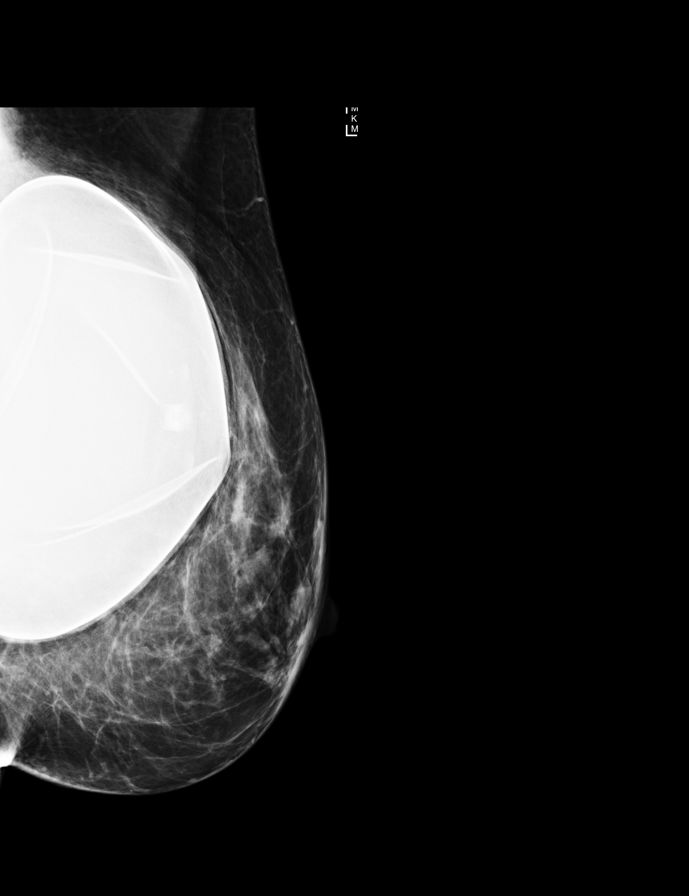

[L MLO synth-2D]
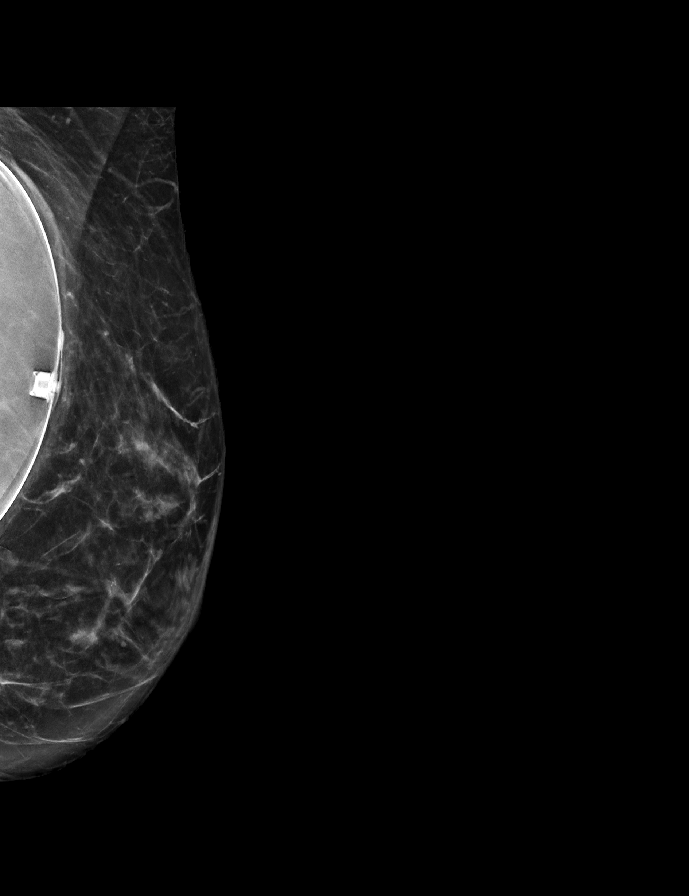

[L CC synth-2D]
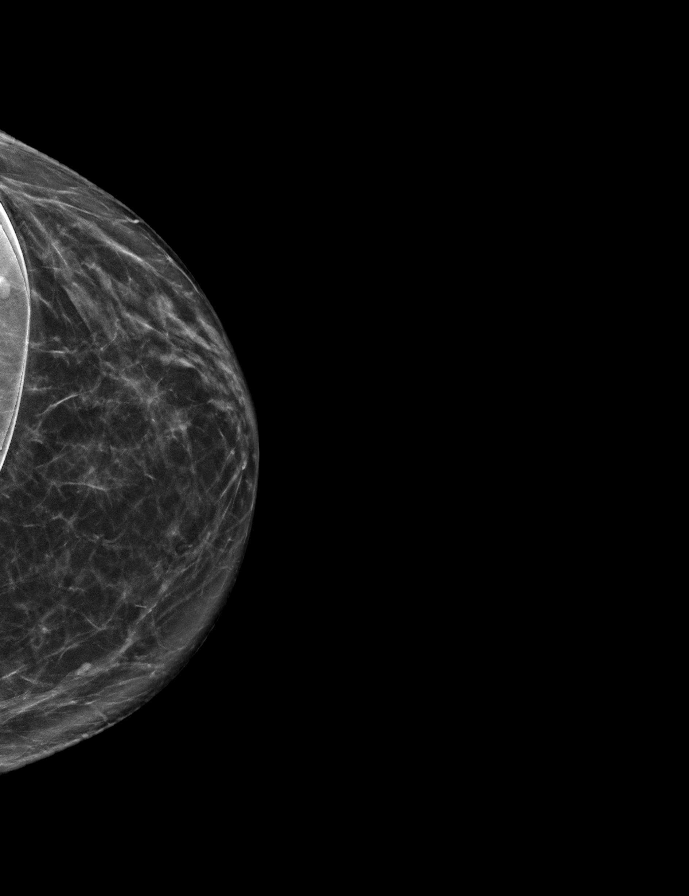

[R MLO synth-2D]
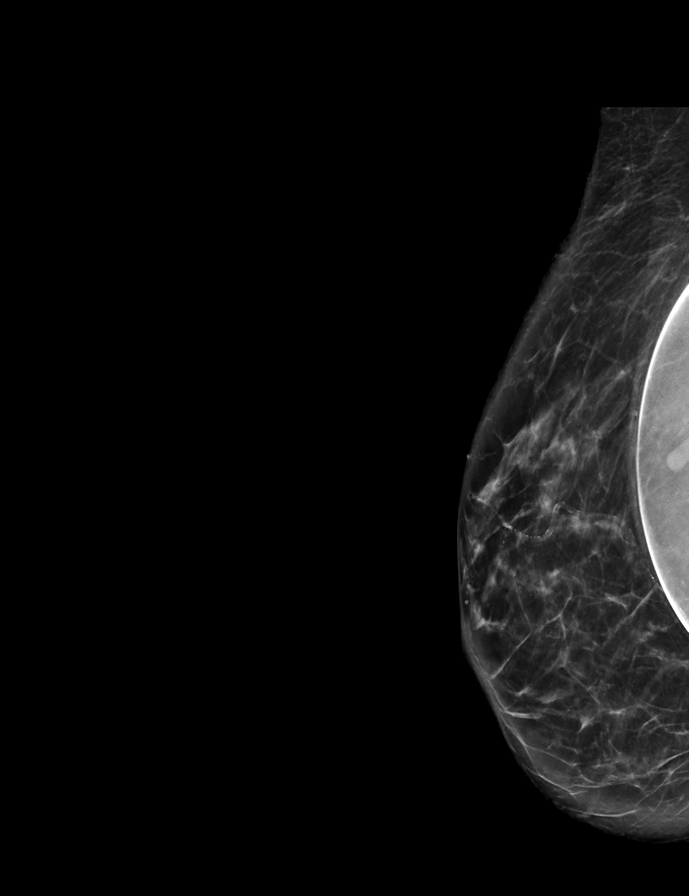

[R CC synth-2D]
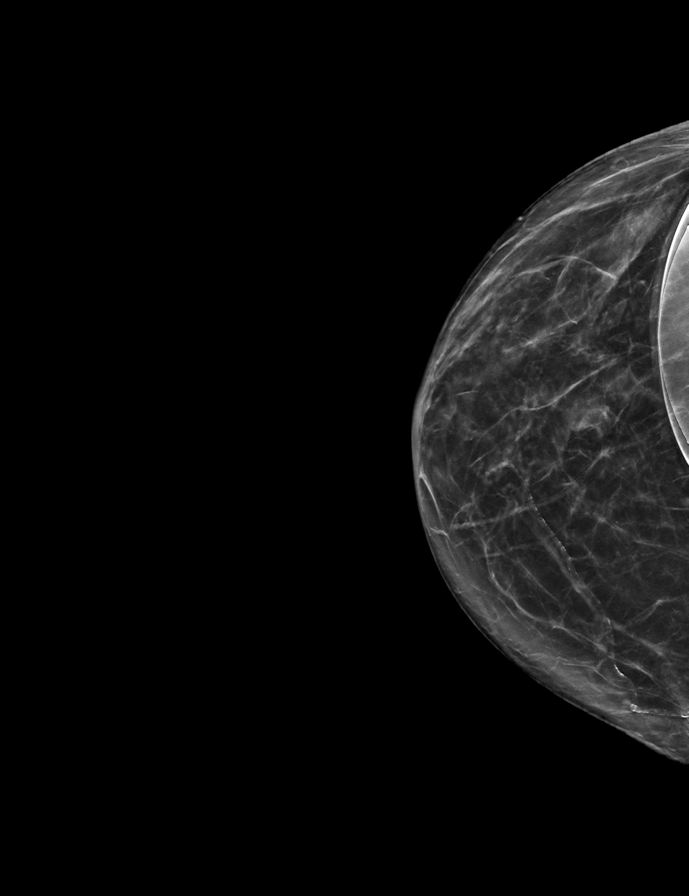

[8 of 28 positions shown; findings below may reference images not displayed]

ACR Breast Density Category b: There are scattered areas of
fibroglandular density.
FINDINGS: The patient has bilateral retropectoral saline implants.

In the left breast, a possible asymmetry warrants further
evaluation. In the right breast, no suspicious masses or malignant
type calcifications are identified.
IMPRESSION: Further evaluation is suggested for possible asymmetry in the left
breast.

RECOMMENDATION:
Diagnostic mammogram and possibly ultrasound of the left breast.
(Code:X0-0-CCG)

BI-RADS CATEGORY  0: Incomplete. Need additional imaging evaluation
and/or prior mammograms for comparison.

ADDENDUM:
Prior outside mammograms dated 12/30/2014 in 12/26/2013 have become
available for comparison. The recommendation from the recent
screening mammography report for further evaluation of the possible
left breast asymmetry remains.

BI-RADS category: 0: Incomplete. Need additional imaging evaluation
and/or prior mammograms for comparison.

*** End of Addendum ***
ACR Breast Density Category b: There are scattered areas of
fibroglandular density.
FINDINGS: The patient has bilateral retropectoral saline implants.

In the left breast, a possible asymmetry warrants further
evaluation. In the right breast, no suspicious masses or malignant
type calcifications are identified.
IMPRESSION: Further evaluation is suggested for possible asymmetry in the left
breast.

RECOMMENDATION:
Diagnostic mammogram and possibly ultrasound of the left breast.
(Code:X0-0-CCG)

BI-RADS CATEGORY  0: Incomplete. Need additional imaging evaluation
and/or prior mammograms for comparison.

## 2022-09-04 ENCOUNTER — Encounter (HOSPITAL_BASED_OUTPATIENT_CLINIC_OR_DEPARTMENT_OTHER): Payer: Self-pay

## 2022-09-04 ENCOUNTER — Emergency Department (HOSPITAL_BASED_OUTPATIENT_CLINIC_OR_DEPARTMENT_OTHER)
Admission: EM | Admit: 2022-09-04 | Discharge: 2022-09-04 | Discharge status: Against Medical Advice | Payer: Medicare HMO | Attending: Emergency Medicine | Admitting: Emergency Medicine

## 2022-09-04 ENCOUNTER — Other Ambulatory Visit: Payer: Self-pay

## 2022-09-04 DIAGNOSIS — R519 Headache, unspecified: Secondary | ICD-10-CM | POA: Insufficient documentation

## 2022-09-04 DIAGNOSIS — R112 Nausea with vomiting, unspecified: Secondary | ICD-10-CM | POA: Insufficient documentation

## 2022-09-04 DIAGNOSIS — Z20822 Contact with and (suspected) exposure to covid-19: Secondary | ICD-10-CM | POA: Diagnosis not present

## 2022-09-04 DIAGNOSIS — Z5321 Procedure and treatment not carried out due to patient leaving prior to being seen by health care provider: Secondary | ICD-10-CM | POA: Insufficient documentation

## 2022-09-04 DIAGNOSIS — R197 Diarrhea, unspecified: Secondary | ICD-10-CM | POA: Insufficient documentation

## 2022-09-04 LAB — RESP PANEL BY RT-PCR (FLU A&B, COVID) ARPGX2
Influenza A by PCR: NEGATIVE
Influenza B by PCR: NEGATIVE
SARS Coronavirus 2 by RT PCR: NEGATIVE

## 2022-09-04 NOTE — ED Notes (Signed)
Pt left AMA before being seen by the provider. Pt didn't want to wait any longer. Attempted to get pt to wait was unsuccessful. Provider notified.

## 2022-09-04 NOTE — ED Triage Notes (Signed)
Pt presents with c/o n/v headache for 2 days, diarrhea today. Pt reports this has been coming and going. No fever at triage

## 2022-09-04 NOTE — ED Notes (Signed)
Nausea, abdominal pain and a headache. Ongoing for the last 3 weeks. Occurring intermittently.

## 2022-09-12 DIAGNOSIS — M5136 Other intervertebral disc degeneration, lumbar region: Secondary | ICD-10-CM | POA: Diagnosis not present

## 2022-09-12 DIAGNOSIS — Z13220 Encounter for screening for lipoid disorders: Secondary | ICD-10-CM | POA: Diagnosis not present

## 2022-09-12 DIAGNOSIS — Z0000 Encounter for general adult medical examination without abnormal findings: Secondary | ICD-10-CM | POA: Diagnosis not present

## 2022-09-12 DIAGNOSIS — R946 Abnormal results of thyroid function studies: Secondary | ICD-10-CM | POA: Diagnosis not present

## 2022-09-12 DIAGNOSIS — G629 Polyneuropathy, unspecified: Secondary | ICD-10-CM | POA: Diagnosis not present

## 2022-09-12 DIAGNOSIS — Z79899 Other long term (current) drug therapy: Secondary | ICD-10-CM | POA: Diagnosis not present

## 2022-09-12 DIAGNOSIS — R270 Ataxia, unspecified: Secondary | ICD-10-CM | POA: Diagnosis not present

## 2022-09-12 DIAGNOSIS — R0602 Shortness of breath: Secondary | ICD-10-CM | POA: Diagnosis not present

## 2022-09-12 DIAGNOSIS — G3281 Cerebellar ataxia in diseases classified elsewhere: Secondary | ICD-10-CM | POA: Diagnosis not present

## 2022-09-12 DIAGNOSIS — R7309 Other abnormal glucose: Secondary | ICD-10-CM | POA: Diagnosis not present

## 2022-09-12 DIAGNOSIS — E559 Vitamin D deficiency, unspecified: Secondary | ICD-10-CM | POA: Diagnosis not present

## 2022-09-12 DIAGNOSIS — Z87891 Personal history of nicotine dependence: Secondary | ICD-10-CM | POA: Diagnosis not present

## 2022-09-22 DIAGNOSIS — M546 Pain in thoracic spine: Secondary | ICD-10-CM | POA: Diagnosis not present

## 2022-09-22 DIAGNOSIS — M47814 Spondylosis without myelopathy or radiculopathy, thoracic region: Secondary | ICD-10-CM | POA: Diagnosis not present

## 2022-09-22 DIAGNOSIS — M542 Cervicalgia: Secondary | ICD-10-CM | POA: Diagnosis not present

## 2022-09-22 DIAGNOSIS — G8929 Other chronic pain: Secondary | ICD-10-CM | POA: Diagnosis not present

## 2022-09-22 DIAGNOSIS — M5136 Other intervertebral disc degeneration, lumbar region: Secondary | ICD-10-CM | POA: Diagnosis not present

## 2022-09-22 DIAGNOSIS — F1120 Opioid dependence, uncomplicated: Secondary | ICD-10-CM | POA: Diagnosis not present

## 2022-10-22 ENCOUNTER — Telehealth: Payer: Self-pay | Admitting: Oncology

## 2022-10-22 NOTE — Telephone Encounter (Signed)
Called patient regarding providers departure, patient is notified. Patient will get rescheduled with new provider.

## 2023-01-05 DIAGNOSIS — G8929 Other chronic pain: Secondary | ICD-10-CM | POA: Diagnosis not present

## 2023-01-05 DIAGNOSIS — M47812 Spondylosis without myelopathy or radiculopathy, cervical region: Secondary | ICD-10-CM | POA: Diagnosis not present

## 2023-01-05 DIAGNOSIS — M542 Cervicalgia: Secondary | ICD-10-CM | POA: Diagnosis not present

## 2023-01-05 DIAGNOSIS — M546 Pain in thoracic spine: Secondary | ICD-10-CM | POA: Diagnosis not present

## 2023-02-02 ENCOUNTER — Other Ambulatory Visit: Payer: Self-pay | Admitting: *Deleted

## 2023-02-02 DIAGNOSIS — D472 Monoclonal gammopathy: Secondary | ICD-10-CM

## 2023-02-05 ENCOUNTER — Other Ambulatory Visit: Payer: Medicare Other

## 2023-02-05 ENCOUNTER — Inpatient Hospital Stay: Payer: 59 | Attending: Oncology

## 2023-02-05 DIAGNOSIS — D472 Monoclonal gammopathy: Secondary | ICD-10-CM | POA: Diagnosis not present

## 2023-02-05 DIAGNOSIS — Z79899 Other long term (current) drug therapy: Secondary | ICD-10-CM | POA: Diagnosis not present

## 2023-02-05 DIAGNOSIS — R1310 Dysphagia, unspecified: Secondary | ICD-10-CM | POA: Insufficient documentation

## 2023-02-05 DIAGNOSIS — M549 Dorsalgia, unspecified: Secondary | ICD-10-CM | POA: Diagnosis not present

## 2023-02-05 DIAGNOSIS — K7460 Unspecified cirrhosis of liver: Secondary | ICD-10-CM | POA: Insufficient documentation

## 2023-02-05 DIAGNOSIS — G8929 Other chronic pain: Secondary | ICD-10-CM | POA: Insufficient documentation

## 2023-02-05 DIAGNOSIS — Z87891 Personal history of nicotine dependence: Secondary | ICD-10-CM | POA: Diagnosis not present

## 2023-02-05 DIAGNOSIS — Z8619 Personal history of other infectious and parasitic diseases: Secondary | ICD-10-CM | POA: Insufficient documentation

## 2023-02-05 LAB — CBC WITH DIFFERENTIAL (CANCER CENTER ONLY)
Abs Immature Granulocytes: 0 10*3/uL (ref 0.00–0.07)
Basophils Absolute: 0 10*3/uL (ref 0.0–0.1)
Basophils Relative: 1 %
Eosinophils Absolute: 0.2 10*3/uL (ref 0.0–0.5)
Eosinophils Relative: 5 %
HCT: 42.4 % (ref 36.0–46.0)
Hemoglobin: 14.3 g/dL (ref 12.0–15.0)
Immature Granulocytes: 0 %
Lymphocytes Relative: 35 %
Lymphs Abs: 1.2 10*3/uL (ref 0.7–4.0)
MCH: 31.2 pg (ref 26.0–34.0)
MCHC: 33.7 g/dL (ref 30.0–36.0)
MCV: 92.6 fL (ref 80.0–100.0)
Monocytes Absolute: 0.3 10*3/uL (ref 0.1–1.0)
Monocytes Relative: 7 %
Neutro Abs: 1.8 10*3/uL (ref 1.7–7.7)
Neutrophils Relative %: 52 %
Platelet Count: 147 10*3/uL — ABNORMAL LOW (ref 150–400)
RBC: 4.58 MIL/uL (ref 3.87–5.11)
RDW: 13.7 % (ref 11.5–15.5)
WBC Count: 3.5 10*3/uL — ABNORMAL LOW (ref 4.0–10.5)
nRBC: 0 % (ref 0.0–0.2)

## 2023-02-05 LAB — CMP (CANCER CENTER ONLY)
ALT: 16 U/L (ref 0–44)
AST: 22 U/L (ref 15–41)
Albumin: 4.3 g/dL (ref 3.5–5.0)
Alkaline Phosphatase: 90 U/L (ref 38–126)
Anion gap: 4 — ABNORMAL LOW (ref 5–15)
BUN: 19 mg/dL (ref 8–23)
CO2: 29 mmol/L (ref 22–32)
Calcium: 9.4 mg/dL (ref 8.9–10.3)
Chloride: 105 mmol/L (ref 98–111)
Creatinine: 0.64 mg/dL (ref 0.44–1.00)
GFR, Estimated: >60 mL/min (ref >60)
Glucose, Bld: 76 mg/dL (ref 70–99)
Potassium: 4.7 mmol/L (ref 3.5–5.1)
Sodium: 138 mmol/L (ref 135–145)
Total Bilirubin: 0.6 mg/dL (ref 0.3–1.2)
Total Protein: 7.2 g/dL (ref 6.5–8.1)

## 2023-02-06 LAB — KAPPA/LAMBDA LIGHT CHAINS
Kappa free light chain: 15.8 mg/L (ref 3.3–19.4)
Kappa, lambda light chain ratio: 2.08 — ABNORMAL HIGH (ref 0.26–1.65)
Lambda free light chains: 7.6 mg/L (ref 5.7–26.3)

## 2023-02-10 LAB — MULTIPLE MYELOMA PANEL, SERUM
Albumin SerPl Elph-Mcnc: 3.7 g/dL (ref 2.9–4.4)
Albumin/Glob SerPl: 1.2 (ref 0.7–1.7)
Alpha 1: 0.3 g/dL (ref 0.0–0.4)
Alpha2 Glob SerPl Elph-Mcnc: 0.6 g/dL (ref 0.4–1.0)
B-Globulin SerPl Elph-Mcnc: 0.8 g/dL (ref 0.7–1.3)
Gamma Glob SerPl Elph-Mcnc: 1.6 g/dL (ref 0.4–1.8)
Globulin, Total: 3.3 g/dL (ref 2.2–3.9)
IgA: 120 mg/dL (ref 87–352)
IgG (Immunoglobin G), Serum: 1479 mg/dL (ref 586–1602)
IgM (Immunoglobulin M), Srm: 65 mg/dL (ref 26–217)
M Protein SerPl Elph-Mcnc: 0.5 g/dL — ABNORMAL HIGH
Total Protein ELP: 7 g/dL (ref 6.0–8.5)

## 2023-02-12 ENCOUNTER — Telehealth: Payer: Self-pay

## 2023-02-12 ENCOUNTER — Inpatient Hospital Stay (HOSPITAL_BASED_OUTPATIENT_CLINIC_OR_DEPARTMENT_OTHER): Payer: 59 | Admitting: Oncology

## 2023-02-12 ENCOUNTER — Ambulatory Visit: Payer: Medicare Other | Admitting: Oncology

## 2023-02-12 VITALS — BP 155/80 | HR 63 | Temp 98.1°F | Resp 18 | Ht 66.0 in | Wt 137.3 lb

## 2023-02-12 DIAGNOSIS — Z8619 Personal history of other infectious and parasitic diseases: Secondary | ICD-10-CM | POA: Diagnosis not present

## 2023-02-12 DIAGNOSIS — M549 Dorsalgia, unspecified: Secondary | ICD-10-CM | POA: Diagnosis not present

## 2023-02-12 DIAGNOSIS — D472 Monoclonal gammopathy: Secondary | ICD-10-CM | POA: Diagnosis not present

## 2023-02-12 DIAGNOSIS — R1310 Dysphagia, unspecified: Secondary | ICD-10-CM | POA: Diagnosis not present

## 2023-02-12 DIAGNOSIS — Z79899 Other long term (current) drug therapy: Secondary | ICD-10-CM | POA: Diagnosis not present

## 2023-02-12 DIAGNOSIS — G8929 Other chronic pain: Secondary | ICD-10-CM | POA: Diagnosis not present

## 2023-02-12 DIAGNOSIS — Z87891 Personal history of nicotine dependence: Secondary | ICD-10-CM | POA: Diagnosis not present

## 2023-02-12 DIAGNOSIS — K7460 Unspecified cirrhosis of liver: Secondary | ICD-10-CM | POA: Diagnosis not present

## 2023-02-12 NOTE — Telephone Encounter (Signed)
Pt scheduled to see Dr. Rhea Belton 04/30/23 at 8:50am. Radiology scheduling to contact pt to schedule MR/MRCP, order in epic.

## 2023-02-12 NOTE — Telephone Encounter (Signed)
-----   Message from Ardell Isaacs, RN sent at 02/12/2023  3:27 PM EDT ----- Regarding: F/U appt and scan? Geannie Risen, Dr Truett Perna had new pt appt today with Ms Sontag inherited her from Dr Binnie Kand Dr Truett Perna wanted to set up F/U with Dr Rhea Belton but also noticed that she was due for a scan last year?  Please let me know what I can do to help  Thanks [facility]

## 2023-02-12 NOTE — Progress Notes (Signed)
Referral sent to CSW due to issues noted on SDOH. She denies any suicidal ideations.

## 2023-02-12 NOTE — Progress Notes (Signed)
[facility] New Patient Consult   Requesting MD: Irena Reichmann, Do [address] [place] [place],  [place] [address]   TATANYA Singh 67 y.o.  04-Oct-1956    Reason for Consult: Monoclonal gammopathy   HPI: Kelly Singh has a history of a biclonal monoclonal protein dating to 2011.  She has been followed by Dr. Clelia Croft since January 2021.  She is felt to have a monoclonal myopathy of unknown significance and has been followed with observation. She has multiple comorbid conditions including a history of cirrhosis, treated hepatitis C, and ataxia.  She has been seen by neurology for the ataxia and difficulty with speech.  She has seen gastroenterology for cirrhosis.  [name] is chronic back pain.  She reports receiving injection therapy and takes hydrocodone once daily.  Past Medical History:  Diagnosis Date   Arthritis    Ataxia    Cerebellar ataxia (HCC)    Chronic hepatitis C (HCC)    Cirrhosis (HCC)    Depression with anxiety    Fibromyalgia    Gallbladder sludge    Hiatal hernia    MGUS (monoclonal gammopathy of unknown significance)    hx with associated anemia   Migraine    Polysubstance abuse (HCC)    in 1970's   Portal venous hypertension (HCC)     .  G1, P1  Past Surgical History:  Procedure Laterality Date   ANKLE SURGERY Right 2011   BREAST ENHANCEMENT SURGERY  1996   SHOULDER ACROMIOPLASTY  03/2021   TUBAL LIGATION  1979   WRIST SURGERY Right 2016    Medications: Reviewed  Allergies:  Allergies  Allergen Reactions   Latex Itching, Swelling, Rash and Other (See Comments)    Eats through skin; blister    Adhesive [Tape] Other (See Comments)    Plastic tape eats through skin - pls use paper tape    Family history: Her mother died of "bone cancer ".  Her father died of esophagus cancer.  Social History:   She lives alone in [place].  She works at a [employer].  She quit smoking cigarettes 25 years  ago.  She reports quitting alcohol in 2022.  She used cocaine and LSD in the 1970s.  She has a daughter in [place].  ROS:   Positives include: Progressive loss of balance and speech since 2020, dysphagia with certain foods for 2 years, altered taste after a change in her medications, chronic knee, ankle, wrist, and back pain  A complete ROS was otherwise negative.  Physical Exam:  Blood pressure (!) 155/80, pulse 63, temperature 98.1 F (36.7 C), temperature source Oral, resp. rate 18, height 5\' 6"  (1.676 m), weight 137 lb 4.8 oz (62.3 kg), SpO2 99 %.  HEENT: Oropharynx without visible mass, neck without mass Lungs: Clear bilaterally, no respiratory distress Cardiac: Regular rate and rhythm Abdomen: No hepatosplenomegaly  Vascular: No leg edema Lymph nodes: No cervical, supraclavicular, axillary, or inguinal nodes Neurologic: Alert and oriented, the motor exam appears intact in the upper and lower extremities bilaterally, she walks with a wide-based ataxic gait Skin: Tattoos, no rash Musculoskeletal: Tender at the low back   LAB:  CBC  Lab Results  Component Value Date   WBC 3.5 (L) 02/05/2023   HGB 14.3 02/05/2023   HCT 42.4 02/05/2023   MCV 92.6 02/05/2023   PLT 147 (L) 02/05/2023   NEUTROABS 1.8 02/05/2023        CMP  Lab Results  Component Value  Date   NA 138 02/05/2023   K 4.7 02/05/2023   CL 105 02/05/2023   CO2 29 02/05/2023   GLUCOSE 76 02/05/2023   BUN 19 02/05/2023   CREATININE 0.64 02/05/2023   CALCIUM 9.4 02/05/2023   PROT 7.2 02/05/2023   ALBUMIN 4.3 02/05/2023   AST 22 02/05/2023   ALT 16 02/05/2023   ALKPHOS 90 02/05/2023   BILITOT 0.6 02/05/2023   GFRNONAA >60 02/05/2023   GFRAA 112 11/16/2020        Assessment/Plan:   Biclonal IgG lambda gammopathy since 2011, reactive versus related to a plasma cell disorder Stable serum M spike and IgG level 02/05/2023 Mild thrombocytopenia-chronic, likely secondary to cirrhosis History of  intermittent neutropenia-likely secondary to cirrhosis Ataxia Cirrhosis secondary to hepatitis C History of a segment 2 liver lesion, LI-RADS 2 on MRI 04/27/2020, less prominent on MRI 05/09/2021 Portal hypertension Speech deficit    Disposition:   [name] has been followed by Dr. Clelia Croft for a biclonal IgG lambda protein.  The protein may represent a biclonal monoclonal gammopathy of unknown significance or this could be related to chronic liver disease.  There is no clinical evidence for progression to a lymphoproliferative disorder or multiple myeloma.  She will return for an office and lab visit in 1 year.  I recommended she follow-up with neurology to evaluate the progressive ataxia and altered speech.  I recommended she follow-up with gastroenterology for management of cirrhosis, surveillance for hepatocellular carcinoma, and evaluation of dysphagia.  Thornton Papas, MD  02/12/2023, 4:55 PM

## 2023-02-13 ENCOUNTER — Telehealth: Payer: Self-pay

## 2023-02-13 NOTE — Telephone Encounter (Signed)
CSW received referral from RN regarding food insecurity and assistance with utilities.  Left vm.

## 2023-02-16 ENCOUNTER — Telehealth: Payer: Self-pay

## 2023-02-16 NOTE — Telephone Encounter (Signed)
CSW left a vm for patient to offer resources for food insecurity and utilities.

## 2023-02-19 ENCOUNTER — Ambulatory Visit (HOSPITAL_BASED_OUTPATIENT_CLINIC_OR_DEPARTMENT_OTHER)
Admission: RE | Admit: 2023-02-19 | Discharge: 2023-02-19 | Disposition: A | Discharge status: Home / Self Care | Payer: 59 | Source: Ambulatory Visit | Attending: Internal Medicine | Admitting: Internal Medicine

## 2023-02-19 ENCOUNTER — Encounter (HOSPITAL_BASED_OUTPATIENT_CLINIC_OR_DEPARTMENT_OTHER): Payer: Self-pay

## 2023-02-19 DIAGNOSIS — K7460 Unspecified cirrhosis of liver: Secondary | ICD-10-CM

## 2023-03-08 ENCOUNTER — Ambulatory Visit (HOSPITAL_COMMUNITY)
Admission: RE | Admit: 2023-03-08 | Discharge: 2023-03-08 | Disposition: A | Discharge status: Home / Self Care | Payer: 59 | Source: Ambulatory Visit | Attending: Internal Medicine | Admitting: Internal Medicine

## 2023-03-08 DIAGNOSIS — K7460 Unspecified cirrhosis of liver: Secondary | ICD-10-CM | POA: Diagnosis not present

## 2023-03-08 DIAGNOSIS — N281 Cyst of kidney, acquired: Secondary | ICD-10-CM | POA: Diagnosis not present

## 2023-03-08 MED ORDER — GADOBUTROL 1 MMOL/ML IV SOLN
6.0000 mL | Freq: Once | INTRAVENOUS | Status: AC | PRN
  Administered 2023-03-08: 6 mL via INTRAVENOUS

## 2023-04-30 ENCOUNTER — Other Ambulatory Visit (INDEPENDENT_AMBULATORY_CARE_PROVIDER_SITE_OTHER): Payer: 59

## 2023-04-30 ENCOUNTER — Ambulatory Visit (INDEPENDENT_AMBULATORY_CARE_PROVIDER_SITE_OTHER): Payer: 59 | Admitting: Internal Medicine

## 2023-04-30 ENCOUNTER — Encounter: Payer: Self-pay | Admitting: Internal Medicine

## 2023-04-30 VITALS — BP 118/80 | HR 86 | Ht 66.0 in | Wt 134.0 lb

## 2023-04-30 DIAGNOSIS — Z1211 Encounter for screening for malignant neoplasm of colon: Secondary | ICD-10-CM | POA: Diagnosis not present

## 2023-04-30 DIAGNOSIS — Z8619 Personal history of other infectious and parasitic diseases: Secondary | ICD-10-CM

## 2023-04-30 DIAGNOSIS — K766 Portal hypertension: Secondary | ICD-10-CM | POA: Diagnosis not present

## 2023-04-30 DIAGNOSIS — K7460 Unspecified cirrhosis of liver: Secondary | ICD-10-CM | POA: Diagnosis not present

## 2023-04-30 LAB — HEPATIC FUNCTION PANEL
ALT: 19 U/L (ref 0–35)
AST: 22 U/L (ref 0–37)
Albumin: 4.5 g/dL (ref 3.5–5.2)
Alkaline Phosphatase: 97 U/L (ref 39–117)
Bilirubin, Direct: 0.2 mg/dL (ref 0.0–0.3)
Total Bilirubin: 0.9 mg/dL (ref 0.2–1.2)
Total Protein: 7.9 g/dL (ref 6.0–8.3)

## 2023-04-30 LAB — PROTIME-INR
INR: 1.2 ratio — ABNORMAL HIGH (ref 0.8–1.0)
Prothrombin Time: 12.7 s (ref 9.6–13.1)

## 2023-04-30 MED ORDER — NA SULFATE-K SULFATE-MG SULF 17.5-3.13-1.6 GM/177ML PO SOLN: 1.0000 | Freq: Once | ORAL | 0 refills | Status: AC

## 2023-04-30 NOTE — Progress Notes (Signed)
Patient ID: Kelly Singh, female   DOB: [birth year], 67 y.o.   MRN: [id number] HPI: Kelly Singh is a 67 year old female with a history of cirrhosis secondary to hepatitis C status posttreatment with Harvoni with SVR, previously elevated IgG with positive ANA, MGUS, ataxia who is here for follow-up.  She has not been seen here since 2021.  She is here alone today.  She did recently have an MRI to follow-up previously seen liver lesion.  She reports that she is doing well.  She is now working for [employer] on third shift.  She feels great other than her minor issues with balance and slower speech related to her ataxia.  She does have lower back pain and intermittently uses hydrocodone.  She has taken herself off of most of her chronic medications but does take fish oil, milk thistle vitamin D and tizanidine as needed.  She denies issues with confusion, bleeding including blood in stool or melena, confusion, lower extremity edema or increasing abdominal girth.  No itching.  No belly pain.  Regular bowel habits.  [place] colon[place] in [place] in [place] Her last EGD was here over 3 years ago.  She is not drinking alcohol.  Past Medical History:  Diagnosis Date   Arthritis    Ataxia    Cerebellar ataxia (HCC)    Chronic hepatitis C (HCC)    Cirrhosis (HCC)    Depression with anxiety    Fibromyalgia    Gallbladder sludge    Hiatal hernia    MGUS (monoclonal gammopathy of unknown significance)    hx with associated anemia   Migraine    Polysubstance abuse (HCC)    in 1970's   Portal venous hypertension (HCC)     Past Surgical History:  Procedure Laterality Date   ANKLE SURGERY Right 2011   BREAST ENHANCEMENT SURGERY  1996   SHOULDER ACROMIOPLASTY  03/2021   TUBAL LIGATION  1979   WRIST SURGERY Right 2016    Outpatient Medications Prior to Visit  Medication Sig Dispense Refill   MILK THISTLE PO Take 1 tablet by mouth daily. occasional     Omega-3 Fatty Acids (FISH OIL PO) Take  1 capsule by mouth daily.     tiZANidine (ZANAFLEX) 2 MG tablet Take 1 tablet (2 mg total) by mouth every 8 (eight) hours as needed for muscle spasms. 60 tablet 11   Vitamin D, Cholecalciferol, 25 MCG (1000 UT) TABS Take 1 tablet by mouth daily.     ondansetron (ZOFRAN ODT) 4 MG disintegrating tablet Take 1 tablet (4 mg total) by mouth every 8 (eight) hours as needed for nausea or vomiting. (Patient not taking: Reported on 02/12/2023) 6 tablet 0   No facility-administered medications prior to visit.    Allergies  Allergen Reactions   Latex Itching, Swelling, Rash and Other (See Comments)    Eats through skin; blister    Adhesive [Tape] Other (See Comments)    Plastic tape eats through skin - pls use paper tape    Family History  Problem Relation Age of Onset   Bone cancer Mother    Esophageal cancer Father     Social History   Tobacco Use   Smoking status: Former    Current packs/day: 0.00    Types: Cigarettes    Quit date: 1998    Years since quitting: 26.5   Smokeless tobacco: Never  Vaping Use   Vaping status: Never Used  Substance Use Topics   Alcohol use: Yes  Comment: occasional   Drug use: Never    ROS: As per history of present illness, otherwise negative  BP 118/80   Pulse 86   Ht 5\' 6"  (1.676 m)   Wt 134 lb (60.8 kg)   BMI 21.63 kg/m  Gen: awake, alert, NAD HEENT: anicteric  CV: RRR, no mrg Pulm: CTA b/l Abd: soft, NT/ND, +BS throughout Ext: no c/c/e Neuro: nonfocal, no asterixis  RELEVANT LABS AND IMAGING: CBC    Component Value Date/Time   WBC 3.5 (L) 02/05/2023 1025   WBC 1.4 (LL) 04/07/2021 0414   RBC 4.58 02/05/2023 1025   HGB 14.3 02/05/2023 1025   HCT 42.4 02/05/2023 1025   PLT 147 (L) 02/05/2023 1025   MCV 92.6 02/05/2023 1025   MCH 31.2 02/05/2023 1025   MCHC 33.7 02/05/2023 1025   RDW 13.7 02/05/2023 1025   LYMPHSABS 1.2 02/05/2023 1025   MONOABS 0.3 02/05/2023 1025   EOSABS 0.2 02/05/2023 1025   BASOSABS 0.0 02/05/2023  1025    CMP     Component Value Date/Time   NA 138 02/05/2023 1025   K 4.7 02/05/2023 1025   CL 105 02/05/2023 1025   CO2 29 02/05/2023 1025   GLUCOSE 76 02/05/2023 1025   BUN 19 02/05/2023 1025   CREATININE 0.64 02/05/2023 1025   CREATININE 0.60 11/16/2020 1008   CALCIUM 9.4 02/05/2023 1025   PROT 7.2 02/05/2023 1025   ALBUMIN 4.3 02/05/2023 1025   AST 22 02/05/2023 1025   ALT 16 02/05/2023 1025   ALT 63 (H) 11/01/2019 0955   ALKPHOS 90 02/05/2023 1025   BILITOT 0.6 02/05/2023 1025   GFRNONAA >60 02/05/2023 1025   GFRNONAA 96 11/16/2020 1008   GFRAA 112 11/16/2020 1008   MRI ABDOMEN WITHOUT AND WITH CONTRAST   TECHNIQUE: Multiplanar multisequence MR imaging of the abdomen was performed both before and after the administration of intravenous contrast.   CONTRAST:  6mL GADAVIST GADOBUTROL 1 MMOL/ML IV SOLN   COMPARISON:  05/09/2021   FINDINGS: Lower chest: No acute abnormality.   Hepatobiliary: Mildly coarse contour of the liver. No suspicious lesion or contrast enhancement. No gallstones, gallbladder wall thickening, or biliary dilatation.   Pancreas: Unremarkable. No pancreatic ductal dilatation or surrounding inflammatory changes.   Spleen: Normal in size without significant abnormality.   Adrenals/Urinary Tract: Adrenal glands are unremarkable. Simple, benign renal cortical cyst of the superior pole of the left kidney, for which no further follow-up or characterization is required. Kidneys are otherwise normal, without obvious renal calculi, solid lesion, or hydronephrosis.   Stomach/Bowel: Stomach is within normal limits. No evidence of bowel wall thickening, distention, or inflammatory changes.   Vascular/Lymphatic: Recanalization of the umbilical vein. No enlarged abdominal lymph nodes.   Other: No abdominal wall hernia or abnormality. No ascites.   Musculoskeletal: No acute or significant osseous findings.   IMPRESSION: 1. No suspicious liver  lesion or contrast enhancement. 2. Mildly coarse contour of the liver, consistent with reported diagnosis of cirrhosis. 3. Recanalization of the umbilical vein. No splenomegaly or ascites.     Electronically Signed   By: Jearld Lesch M.D.   On: 03/14/2023 13:11  ASSESSMENT/PLAN: 67 year old female with a history of cirrhosis secondary to hepatitis C status posttreatment with Harvoni with SVR, previously elevated IgG with positive ANA, MGUS, ataxia who is here for follow-up.   Cirrhosis related to hepatitis C/previous elevated ANA and IgG --her liver enzymes have normalized.  Her INR has not been checked in some time and  needs to be checked today.  She certainly has well compensated disease and has been cured for hep C.  I do not see any evidence of portal hypertension by exam.  Her previous splenomegaly has also improved.  She does have mild thrombocytopenia. -- Repeat IgG today; my suspicion for active autoimmune hepatitis remains quite low as the patient has normalized her liver enzymes; if this becomes a question in the future liver biopsy would be recommended -- Repeat EGD recommended at this time for variceal screening; we discussed the risk, benefits and alternatives and she is agreeable and wishes to proceed -- Her MELD score is overall low assuming INR remains low or near normal; check INR today -- Continue alcohol free lifestyle -- MRI for HCC screening negative; repeat in 1 year -- She reports having previously had Pneumovax, annual influenza vaccination recommended  2.  MGUS --stable and following annually with oncology.  No evidence for multiple myeloma  3.  Colon cancer screening --greater than 10 years since last colonoscopy.  Colonoscopy recommended.  We discussed the risk, benefits and alternatives and she is agreeable and wishes to proceed    VO:[id number], Nathaneil Canary [address] [place],  [place] [address]

## 2023-04-30 NOTE — Patient Instructions (Signed)
Your provider has requested that you go to the basement level for lab work before leaving today. Press "B" on the elevator. The lab is located at the first door on the left as you exit the elevator.  You have been scheduled for an endoscopy and colonoscopy. Please follow the written instructions given to you at your visit today.  Please pick up your prep supplies at the pharmacy within the next 1-3 days.  If you use inhalers (even only as needed), please bring them with you on the day of your procedure.  DO NOT TAKE 7 DAYS PRIOR TO TEST- Trulicity (dulaglutide) Ozempic, Wegovy (semaglutide) Mounjaro (tirzepatide) Bydureon Bcise (exanatide extended release)  DO NOT TAKE 1 DAY PRIOR TO YOUR TEST Rybelsus (semaglutide) Adlyxin (lixisenatide) Victoza (liraglutide) Byetta (exanatide) ___________________________________________________________________________ _______________________________________________________  If your blood pressure at your visit was 140/90 or greater, please contact your primary care physician to follow up on this.  _______________________________________________________  If you are age 56 or older, your body mass index should be between 23-30. Your Body mass index is 21.63 kg/m. If this is out of the aforementioned range listed, please consider follow up with your Primary Care Provider.  If you are age 15 or younger, your body mass index should be between 19-25. Your Body mass index is 21.63 kg/m. If this is out of the aformentioned range listed, please consider follow up with your Primary Care Provider.   ________________________________________________________  The [facility] providers would like to encourage you to use [facility] to communicate with providers for non-urgent requests or questions.  Due to long hold times on the telephone, sending your provider a message by [facility] may be a faster and more efficient way to get a response.  Please allow 48 business  hours for a response.  Please remember that this is for non-urgent requests.  _______________________________________________________

## 2023-05-13 DIAGNOSIS — M47816 Spondylosis without myelopathy or radiculopathy, lumbar region: Secondary | ICD-10-CM | POA: Diagnosis not present

## 2023-05-13 DIAGNOSIS — M546 Pain in thoracic spine: Secondary | ICD-10-CM | POA: Diagnosis not present

## 2023-05-13 DIAGNOSIS — M542 Cervicalgia: Secondary | ICD-10-CM | POA: Diagnosis not present

## 2023-05-13 DIAGNOSIS — G8929 Other chronic pain: Secondary | ICD-10-CM | POA: Diagnosis not present

## 2023-05-13 DIAGNOSIS — M47812 Spondylosis without myelopathy or radiculopathy, cervical region: Secondary | ICD-10-CM | POA: Diagnosis not present

## 2023-06-05 ENCOUNTER — Other Ambulatory Visit: Payer: Self-pay | Admitting: Neurology

## 2023-06-13 ENCOUNTER — Other Ambulatory Visit: Payer: Self-pay

## 2023-06-13 ENCOUNTER — Emergency Department (HOSPITAL_BASED_OUTPATIENT_CLINIC_OR_DEPARTMENT_OTHER)
Admission: EM | Admit: 2023-06-13 | Discharge: 2023-06-13 | Disposition: A | Discharge status: Home / Self Care | Payer: 59 | Attending: Emergency Medicine | Admitting: Emergency Medicine

## 2023-06-13 DIAGNOSIS — Z91040 Latex allergy status: Secondary | ICD-10-CM | POA: Insufficient documentation

## 2023-06-13 DIAGNOSIS — R471 Dysarthria and anarthria: Secondary | ICD-10-CM | POA: Diagnosis not present

## 2023-06-13 DIAGNOSIS — R112 Nausea with vomiting, unspecified: Secondary | ICD-10-CM | POA: Insufficient documentation

## 2023-06-13 DIAGNOSIS — Z20822 Contact with and (suspected) exposure to covid-19: Secondary | ICD-10-CM | POA: Insufficient documentation

## 2023-06-13 DIAGNOSIS — R509 Fever, unspecified: Secondary | ICD-10-CM | POA: Insufficient documentation

## 2023-06-13 LAB — COMPREHENSIVE METABOLIC PANEL
ALT: 13 U/L (ref 0–44)
AST: 16 U/L (ref 15–41)
Albumin: 4.2 g/dL (ref 3.5–5.0)
Alkaline Phosphatase: 86 U/L (ref 38–126)
Anion gap: 7 (ref 5–15)
BUN: 10 mg/dL (ref 8–23)
CO2: 30 mmol/L (ref 22–32)
Calcium: 8.9 mg/dL (ref 8.9–10.3)
Chloride: 103 mmol/L (ref 98–111)
Creatinine, Ser: 0.7 mg/dL (ref 0.44–1.00)
GFR, Estimated: >60 mL/min (ref >60)
Glucose, Bld: 93 mg/dL (ref 70–99)
Potassium: 3.8 mmol/L (ref 3.5–5.1)
Sodium: 140 mmol/L (ref 135–145)
Total Bilirubin: 1.1 mg/dL (ref 0.3–1.2)
Total Protein: 7.4 g/dL (ref 6.5–8.1)

## 2023-06-13 LAB — CBC WITH DIFFERENTIAL/PLATELET
Abs Immature Granulocytes: 0 10*3/uL (ref 0.00–0.07)
Basophils Absolute: 0 10*3/uL (ref 0.0–0.1)
Basophils Relative: 1 %
Eosinophils Absolute: 0.1 10*3/uL (ref 0.0–0.5)
Eosinophils Relative: 3 %
HCT: 45.6 % (ref 36.0–46.0)
Hemoglobin: 15.7 g/dL — ABNORMAL HIGH (ref 12.0–15.0)
Immature Granulocytes: 0 %
Lymphocytes Relative: 40 %
Lymphs Abs: 1.5 10*3/uL (ref 0.7–4.0)
MCH: 31.7 pg (ref 26.0–34.0)
MCHC: 34.4 g/dL (ref 30.0–36.0)
MCV: 91.9 fL (ref 80.0–100.0)
Monocytes Absolute: 0.3 10*3/uL (ref 0.1–1.0)
Monocytes Relative: 7 %
Neutro Abs: 2 10*3/uL (ref 1.7–7.7)
Neutrophils Relative %: 49 %
Platelets: 160 10*3/uL (ref 150–400)
RBC: 4.96 MIL/uL (ref 3.87–5.11)
RDW: 13.2 % (ref 11.5–15.5)
WBC: 3.9 10*3/uL — ABNORMAL LOW (ref 4.0–10.5)
nRBC: 0 % (ref 0.0–0.2)

## 2023-06-13 LAB — SARS CORONAVIRUS 2 BY RT PCR: SARS Coronavirus 2 by RT PCR: NEGATIVE

## 2023-06-13 LAB — LIPASE, BLOOD: Lipase: 32 U/L (ref 11–51)

## 2023-06-13 MED ORDER — ONDANSETRON 4 MG PO TBDP: 4.0000 mg | ORAL_TABLET | Freq: Three times a day (TID) | ORAL | 0 refills | Status: AC | PRN

## 2023-06-13 MED ORDER — SODIUM CHLORIDE 0.9 % IV BOLUS
1000.0000 mL | Freq: Once | INTRAVENOUS | Status: AC
  Administered 2023-06-13: 1000 mL via INTRAVENOUS

## 2023-06-13 MED ORDER — ONDANSETRON HCL 4 MG/2ML IJ SOLN
4.0000 mg | Freq: Once | INTRAMUSCULAR | Status: AC
  Administered 2023-06-13: 4 mg via INTRAVENOUS
  Filled 2023-06-13: qty 2

## 2023-06-13 NOTE — ED Triage Notes (Signed)
Pt self ambulated to exam 14 c/o N/V fever x 24 hours. Pt states she has had emesis x 6 in last 24hrs currently denies abd pain. Pt endorses delayed speech and balance issues from previous CVA.  VSS NAD PT a/o x 4 temp 97.5

## 2023-06-13 NOTE — ED Provider Notes (Signed)
[place] EMERGENCY DEPARTMENT AT [facility]  Provider Note  CSN: [id number] Arrival date & time: 06/13/23 0421  History Chief Complaint  Patient presents with   Nausea    Kelly Singh is a 67 y.o. female reports she was feeling poorly at work on the night of 9/6, with migraine and nausea/vomiting. She had a subjective fever then. Went home from work early but felt better during the day. She went back to work tonight and reports nausea returned so she decided to leave early but her boss said she had to come to get checked out. She reports some nausea now but no vomiting. No abdominal pain. No headache now.    Home Medications Prior to Admission medications   Medication Sig Start Date End Date Taking? Authorizing Provider  ondansetron (ZOFRAN-ODT) 4 MG disintegrating tablet Take 1 tablet (4 mg total) by mouth every 8 (eight) hours as needed for nausea or vomiting. 06/13/23  Yes Pollyann Savoy, MD  MILK THISTLE PO Take 1 tablet by mouth daily. occasional    [provider]  Omega-3 Fatty Acids (FISH OIL PO) Take 1 capsule by mouth daily.    [provider]  tiZANidine (ZANAFLEX) 2 MG tablet Take 1 tablet (2 mg total) by mouth every 8 (eight) hours as needed for muscle spasms. 06/18/22   Levert Feinstein, MD  Vitamin D, Cholecalciferol, 25 MCG (1000 UT) TABS Take 1 tablet by mouth daily.    [provider]     Allergies    Latex and Adhesive [tape]   Review of Systems   Review of Systems Please see HPI for pertinent positives and negatives  Physical Exam BP (!) 158/85 (BP Location: Right Arm)   Pulse 61   Temp 97.6 F (36.4 C) (Oral)   Resp 18   Wt 61.8 kg   SpO2 98%   BMI 21.98 kg/m   Physical Exam Vitals and nursing note reviewed.  Constitutional:      Appearance: Normal appearance.  HENT:     Head: Normocephalic and atraumatic.     Nose: Nose normal.     Mouth/Throat:     Mouth: Mucous membranes are moist.  Eyes:      Extraocular Movements: Extraocular movements intact.     Conjunctiva/sclera: Conjunctivae normal.  Cardiovascular:     Rate and Rhythm: Normal rate.  Pulmonary:     Effort: Pulmonary effort is normal.     Breath sounds: Normal breath sounds.  Abdominal:     General: Abdomen is flat.     Palpations: Abdomen is soft.     Tenderness: There is no abdominal tenderness.  Musculoskeletal:        General: No swelling. Normal range of motion.     Cervical back: Neck supple.  Skin:    General: Skin is warm and dry.  Neurological:     General: No focal deficit present.     Mental Status: She is alert and oriented to person, place, and time.     Cranial Nerves: No cranial nerve deficit.     Motor: No weakness.     Comments: Mild dysarthria she reports is old  Psychiatric:        Mood and Affect: Mood normal.     ED Results / Procedures / Treatments   EKG None  Procedures Procedures  Medications Ordered in the ED Medications  sodium chloride 0.9 % bolus 1,000 mL (0 mLs Intravenous Stopped 06/13/23 0547)  ondansetron (ZOFRAN) injection 4  mg (4 mg Intravenous Given 06/13/23 0449)    Initial Impression and Plan  Patient here with about 24 hours of nausea, vomiting and subjective fever. Overall her exam is reassuring. Will check basic labs, Covid swab. Give IVF and antiemetics.   ED Course   Clinical Course as of 06/13/23 0618  Sat Jun 13, 2023  6045 CBC with mild leukopenia, similar to previous and likely related to her chronic liver disease. PLT are normal.  [CS]  0545 Covid is negative [CS]  0549 CMP and lipase are normal.  [CS]  4098 Patient feeling better. Labs here are unremarkable and she is eager to go home. She has a day off of work, but would like to go back tomorrow night. Will give Rx for Zofran as needed. Recommend PCP follow up, RTED for any other concerns.   [CS]    Clinical Course User Index [CS] Pollyann Savoy, MD     MDM Rules/Calculators/A&P Medical  Decision Making Problems Addressed: Nausea and vomiting, unspecified vomiting type: acute illness or injury  Amount and/or Complexity of Data Reviewed Labs: ordered. Decision-making details documented in ED Course.  Risk Prescription drug management.     Final Clinical Impression(s) / ED Diagnoses Final diagnoses:  Nausea and vomiting, unspecified vomiting type    Rx / DC Orders ED Discharge Orders          Ordered    ondansetron (ZOFRAN-ODT) 4 MG disintegrating tablet  Every 8 hours PRN        06/13/23 0617             Pollyann Savoy, MD 06/13/23 [phone]

## 2023-06-13 NOTE — ED Notes (Signed)
Covid swab collected

## 2023-06-30 ENCOUNTER — Encounter: Payer: 59 | Admitting: Internal Medicine

## 2023-07-10 ENCOUNTER — Telehealth: Payer: Self-pay

## 2023-07-10 NOTE — Telephone Encounter (Signed)
Transition Care Management Unsuccessful Follow-up Telephone Call  Date of discharge and from where:  [facility]  Attempts:  1st Attempt  Reason for unsuccessful TCM follow-up call:  No answer/busy   Lenard Forth [place]lew  [facility], [facility] Guide, Phone: [phone] Website: [email or web address]

## 2023-07-13 ENCOUNTER — Telehealth: Payer: Self-pay

## 2023-07-13 NOTE — Telephone Encounter (Signed)
Transition Care Management Follow-up Telephone Call Date of discharge and from where: [facility] How have you been since you were released from the hospital? Patient stated she was fine and hasn't followed up with PCP because they wont let her make an appointment until she can pay a bill. She stated she should have never moved to [place] and delined the rest if the call  Any questions or concerns? No  Items Reviewed: Did the pt receive and understand the discharge instructions provided?  Medications obtained and verified?  Other?  Any new allergies since your discharge?  Dietary orders reviewed?  Do you have support at home?     Follow up appointments reviewed:  PCP Hospital f/u appt confirmed? No  Scheduled to see  on  @ . Specialist Hospital f/u appt confirmed? No  Scheduled to see  on  @ . Are transportation arrangements needed? No  If their condition worsens, is the pt aware to call PCP or go to the Emergency Dept.?  Was the patient provided with contact information for the PCP's office or ED?  Was to pt encouraged to call back with questions or concerns?

## 2023-07-14 ENCOUNTER — Emergency Department (HOSPITAL_BASED_OUTPATIENT_CLINIC_OR_DEPARTMENT_OTHER): Payer: 59 | Admitting: Radiology

## 2023-07-14 ENCOUNTER — Encounter (HOSPITAL_BASED_OUTPATIENT_CLINIC_OR_DEPARTMENT_OTHER): Payer: Self-pay | Admitting: Emergency Medicine

## 2023-07-14 ENCOUNTER — Emergency Department (HOSPITAL_BASED_OUTPATIENT_CLINIC_OR_DEPARTMENT_OTHER)
Admission: EM | Admit: 2023-07-14 | Discharge: 2023-07-14 | Disposition: A | Discharge status: Home / Self Care | Payer: 59 | Attending: Emergency Medicine | Admitting: Emergency Medicine

## 2023-07-14 ENCOUNTER — Other Ambulatory Visit: Payer: Self-pay

## 2023-07-14 DIAGNOSIS — Z87891 Personal history of nicotine dependence: Secondary | ICD-10-CM | POA: Insufficient documentation

## 2023-07-14 DIAGNOSIS — M25561 Pain in right knee: Secondary | ICD-10-CM | POA: Diagnosis not present

## 2023-07-14 DIAGNOSIS — W19XXXA Unspecified fall, initial encounter: Secondary | ICD-10-CM

## 2023-07-14 DIAGNOSIS — M1711 Unilateral primary osteoarthritis, right knee: Secondary | ICD-10-CM | POA: Diagnosis not present

## 2023-07-14 DIAGNOSIS — S80911A Unspecified superficial injury of right knee, initial encounter: Secondary | ICD-10-CM | POA: Diagnosis not present

## 2023-07-14 DIAGNOSIS — W01198A Fall on same level from slipping, tripping and stumbling with subsequent striking against other object, initial encounter: Secondary | ICD-10-CM | POA: Insufficient documentation

## 2023-07-14 DIAGNOSIS — M85861 Other specified disorders of bone density and structure, right lower leg: Secondary | ICD-10-CM | POA: Diagnosis not present

## 2023-07-14 DIAGNOSIS — Z91040 Latex allergy status: Secondary | ICD-10-CM | POA: Diagnosis not present

## 2023-07-14 MED ORDER — CELECOXIB 200 MG PO CAPS: 200.0000 mg | ORAL_CAPSULE | Freq: Two times a day (BID) | ORAL | 0 refills | Status: AC

## 2023-07-14 MED ORDER — CELECOXIB 200 MG PO CAPS
200.0000 mg | ORAL_CAPSULE | Freq: Once | ORAL | Status: AC
  Administered 2023-07-14: 200 mg via ORAL
  Filled 2023-07-14: qty 1

## 2023-07-14 NOTE — ED Provider Notes (Signed)
Care of patient received from prior provider at 1:05 PM, please see their note for complete H/P and care plan.  Received handoff per ED course.  Clinical Course as of 07/14/23 1305  Tue Jul 14, 2023  1304 Stable  34 YOF with GLF. Fell on right knee.  Workplace injury. Ambulatory. No obvious deformity. [CC]    Clinical Course User Index [CC] Glyn Ade, MD    Reassessment: X-ray with no focal pathology.  Patient does have visible bursitis of the right anterior medial bursa.  Fairly substantial pain with passive range of motion.  No obvious joint effusion.  No erythema or no fever.  Unlikely to be gout or septic arthritis at this time. Patient well-appearing Will treat with anti-inflammatories given lack of medical comorbidities and refer to orthopedics for repeat evaluation in 72 hours.  Disposition:  I have considered need for hospitalization, however, considering all of the above, I believe this patient is stable for discharge at this time.  Patient/family educated about specific return precautions for given chief complaint and symptoms.  Patient/family educated about follow-up with PCP.     Patient/family expressed understanding of return precautions and need for follow-up. Patient spoken to regarding all imaging and laboratory results and appropriate follow up for these results. All education provided in verbal form with additional information in written form. Time was allowed for answering of patient questions. Patient discharged.    Emergency Department Medication Summary:   Medications  celecoxib (CELEBREX) capsule 200 mg (has no administration in time range)          Glyn Ade, MD 07/14/23 1424

## 2023-07-14 NOTE — Discharge Instructions (Addendum)
As discussed, x-ray of your right knee did not show signs of any fracture or dislocation.  Recommend continued rest, ice, elevation, NSAIDs such as ibuprofen/Aleve for treatment of symptoms.  Attached is number for orthopedics to schedule an appointment if you continue to have symptoms.  Please do not hesitate to return to emergency department for worrisome signs and symptoms we discussed become apparent.

## 2023-07-14 NOTE — ED Triage Notes (Addendum)
Tripped and fell yesterday.  C/o R knee pain.

## 2023-07-14 NOTE — ED Notes (Signed)
Discharge paperwork given and verbally understood. 

## 2023-07-14 NOTE — ED Provider Notes (Signed)
[place] EMERGENCY DEPARTMENT AT [facility] Provider Note   CSN: [id number] Arrival date & time: 07/14/23  1030     History  Chief Complaint  Patient presents with   Kelly Singh    Kelly Singh is a 67 y.o. female.   Fall   67 year old female presents emergency department with complaints of fall.  Patient states that she tripped 2 days ago when she is outside on a "gumball" from a tree.  States that she landed on her right knee and subsequently landed on her bottom.  Reports right knee pain since incident.  States that she works for [employer] and is required to report to provider when an injury occurs.  States that she would not of come to the emergency department otherwise.  Has been able to ambulate since the incident but with some pain.  Denies any trauma to head, loss consciousness.  Denies any chest pain, shortness of breath, abdominal pain, nausea, vomiting.  Past medical history significant for cerebellar ataxia, fibromyalgia, MGUS, polysubstance abuse, chronic hepatitis C, chronic low back pain  Home Medications Prior to Admission medications   Medication Sig Start Date End Date Taking? Authorizing Provider  celecoxib (CELEBREX) 200 MG capsule Take 1 capsule (200 mg total) by mouth 2 (two) times daily. 07/14/23  Yes Countryman, Almeta Monas, MD  MILK THISTLE PO Take 1 tablet by mouth daily. occasional    [provider]  Omega-3 Fatty Acids (FISH OIL PO) Take 1 capsule by mouth daily.    [provider]  ondansetron (ZOFRAN-ODT) 4 MG disintegrating tablet Take 1 tablet (4 mg total) by mouth every 8 (eight) hours as needed for nausea or vomiting. 06/13/23   Pollyann Savoy, MD  tiZANidine (ZANAFLEX) 2 MG tablet Take 1 tablet (2 mg total) by mouth every 8 (eight) hours as needed for muscle spasms. 06/18/22   Levert Feinstein, MD  Vitamin D, Cholecalciferol, 25 MCG (1000 UT) TABS Take 1 tablet by mouth daily.    [provider]      Allergies    Latex and  Adhesive [tape]    Review of Systems   Review of Systems  All other systems reviewed and are negative.   Physical Exam Updated Vital Signs BP (!) 171/87 (BP Location: Right Arm)   Pulse (!) 57   Temp (!) 97.5 F (36.4 C)   Resp 16   SpO2 100%  Physical Exam Vitals and nursing note reviewed.  Constitutional:      General: She is not in acute distress.    Appearance: She is well-developed.  HENT:     Head: Normocephalic and atraumatic.  Eyes:     Conjunctiva/sclera: Conjunctivae normal.  Cardiovascular:     Rate and Rhythm: Normal rate and regular rhythm.     Heart sounds: No murmur heard. Pulmonary:     Effort: Pulmonary effort is normal. No respiratory distress.     Breath sounds: Normal breath sounds.  Abdominal:     Palpations: Abdomen is soft.     Tenderness: There is no abdominal tenderness.  Musculoskeletal:        General: No swelling.     Cervical back: Neck supple.     Comments: No midline tenderness of cervical, thoracic, lumbar spine without step-off or deformity.  No tenderness to palpation of upper extremities.  Some tenderness of anterior medial aspect of right tibia approximately.  Some overlying swelling.  No obvious ecchymosis, erythema, palpable fluctuance/induration.  Full range of motion of right knee.  Pedal pulses 2+ bilaterally.  Otherwise, no tenderness of lower extremities.  Skin:    General: Skin is warm and dry.     Capillary Refill: Capillary refill takes less than 2 seconds.  Neurological:     Mental Status: She is alert.  Psychiatric:        Mood and Affect: Mood normal.     ED Results / Procedures / Treatments   Labs (all labs ordered are listed, but only abnormal results are displayed) Labs Reviewed - No data to display  EKG None  Radiology DG Knee Complete 4 Views Right  Result Date: 07/14/2023 CLINICAL DATA:  Fall.  Right knee pain. EXAM: RIGHT KNEE - COMPLETE 4+ VIEW COMPARISON:  None Available. FINDINGS: There is diffuse  osteopenia of the visualized osseous structures. No acute fracture or dislocation. No aggressive osseous lesion. There are degenerative changes of the knee joint in the form of mildly reduced medial tibio-femoral compartment joint space, tibial spiking and osteophytosis. No knee effusion or focal soft tissue swelling. No radiopaque foreign bodies. IMPRESSION: 1. Osteopenia and degenerative change. No acute fracture or dislocation. Electronically Signed   By: Jules Schick M.D.   On: 07/14/2023 14:01    Procedures Procedures    Medications Ordered in ED Medications  celecoxib (CELEBREX) capsule 200 mg (200 mg Oral Given 07/14/23 1446)    ED Course/ Medical Decision Making/ A&P Clinical Course as of 07/15/23 0957  Tue Jul 14, 2023  1304 Stable  67 YOF with GLF. Fell on right knee.  Workplace injury. Ambulatory. No obvious deformity. [CC]    Clinical Course User Index [CC] Glyn Ade, MD                                 Medical Decision Making Amount and/or Complexity of Data Reviewed Radiology: ordered.  Risk Prescription drug management.   This patient presents to the ED for concern of fall, right knee pain, this involves an extensive number of treatment options, and is a complaint that carries with it a high risk of complications and morbidity.  The differential diagnosis includes fracture, strain/pain, dislocation, ligamentous/tendinous injury, neurovascular compromise, other   Co morbidities that complicate the patient evaluation  See HPI   Additional history obtained:  Additional history obtained from EMR External records from outside source obtained and reviewed including hospital records   Lab Tests:  N/a   Imaging Studies ordered:  I ordered imaging studies including right knee x-ray I independently visualized and interpreted imaging which showed no acute osseous abnormality I agree with the radiologist interpretation   Cardiac Monitoring: /  EKG:  The patient was maintained on a cardiac monitor.  I personally viewed and interpreted the cardiac monitored which showed an underlying rhythm of: Sinus rhythm   Consultations Obtained:  N/a   Problem List / ED Course / Critical interventions / Medication management  Fall, right knee pain Reevaluation of the patient showed that the patient stayed the same I have reviewed the patients home medicines and have made adjustments as needed   Social Determinants of Health:  Former cigarette use.  Polysubstance use.   Test / Admission - Considered:  Fall, right knee pain Vitals signs within normal range and stable throughout visit. Imaging studies significant for: See above 67 year old female presents emergency department with complaints of mechanical fall as well as right knee pain.  On exam, patient with mild tenderness medial aspect of right  knee without any obvious joint laxity.  No evidence of pulse deficits concerning for ischemic limb.  No lower extremity swelling concerning for DVT.  No overlying skin changes concerning for secondary infectious process.  At time of shift change, awaiting reading of right knee x-ray.  Will expect discharge once imaging study is read.  At time of shift change, patient care handoff to Dr. Doran Durand.  Patient stable upon shift change.          Final Clinical Impression(s) / ED Diagnoses Final diagnoses:  Fall, initial encounter  Acute pain of right knee    Rx / DC Orders ED Discharge Orders          Ordered    celecoxib (CELEBREX) 200 MG capsule  2 times daily        07/14/23 1424              Peter Garter, [place] 07/15/23 0957    Virgina Norfolk, DO 07/20/23 0701

## 2023-07-14 NOTE — ED Notes (Signed)
Pt called for in the lobby, no answer.

## 2023-07-16 DIAGNOSIS — M25561 Pain in right knee: Secondary | ICD-10-CM | POA: Diagnosis not present

## 2023-08-04 DIAGNOSIS — M47816 Spondylosis without myelopathy or radiculopathy, lumbar region: Secondary | ICD-10-CM | POA: Diagnosis not present

## 2023-08-13 DIAGNOSIS — J988 Other specified respiratory disorders: Secondary | ICD-10-CM | POA: Diagnosis not present

## 2023-08-14 ENCOUNTER — Telehealth: Payer: Self-pay

## 2023-08-14 NOTE — Telephone Encounter (Signed)
 Transition Care Management Unsuccessful Follow-up Telephone Call  Date of discharge and from where:  07/14/2023 [facility]  Attempts:  1st Attempt  Reason for unsuccessful TCM follow-up call:  No answer/busy  Lakendra Helling [facility]  [facility], [facility] Guide Direct Dial: [phone]  Website: [email or web address]

## 2023-08-14 NOTE — Telephone Encounter (Signed)
Transition Care Management Follow-up Telephone Call Date of discharge and from where: 07/14/2023 [facility] How have you been since you were released from the hospital? Patient stated she is feeling fine. Any questions or concerns? No  Items Reviewed: Did the pt receive and understand the discharge instructions provided? Yes  Medications obtained and verified? Yes  Other? No  Any new allergies since your discharge? No  Dietary orders reviewed? Yes Do you have support at home? Yes   Follow up appointments reviewed:  PCP Hospital f/u appt confirmed? No  Scheduled to see  on  @ . Specialist Hospital f/u appt confirmed? No  Scheduled to see  on  @ . Are transportation arrangements needed? No  If their condition worsens, is the pt aware to call PCP or go to the Emergency Dept.? Yes Was the patient provided with contact information for the PCP's office or ED? Yes Was to pt encouraged to call back with questions or concerns? Yes   Anayeli Arel [facility]  [facility], [facility] Guide Direct Dial: [phone]  Website: [email or web address]

## 2024-01-26 DIAGNOSIS — Z79899 Other long term (current) drug therapy: Secondary | ICD-10-CM | POA: Diagnosis not present

## 2024-01-26 DIAGNOSIS — R946 Abnormal results of thyroid function studies: Secondary | ICD-10-CM | POA: Diagnosis not present

## 2024-01-26 DIAGNOSIS — E559 Vitamin D deficiency, unspecified: Secondary | ICD-10-CM | POA: Diagnosis not present

## 2024-01-26 DIAGNOSIS — Z13220 Encounter for screening for lipoid disorders: Secondary | ICD-10-CM | POA: Diagnosis not present

## 2024-01-26 DIAGNOSIS — R7309 Other abnormal glucose: Secondary | ICD-10-CM | POA: Diagnosis not present

## 2024-02-02 DIAGNOSIS — Z8619 Personal history of other infectious and parasitic diseases: Secondary | ICD-10-CM | POA: Diagnosis not present

## 2024-02-02 DIAGNOSIS — Z8669 Personal history of other diseases of the nervous system and sense organs: Secondary | ICD-10-CM | POA: Diagnosis not present

## 2024-02-02 DIAGNOSIS — E559 Vitamin D deficiency, unspecified: Secondary | ICD-10-CM | POA: Diagnosis not present

## 2024-02-02 DIAGNOSIS — D472 Monoclonal gammopathy: Secondary | ICD-10-CM | POA: Diagnosis not present

## 2024-02-02 DIAGNOSIS — Z87891 Personal history of nicotine dependence: Secondary | ICD-10-CM | POA: Diagnosis not present

## 2024-02-02 DIAGNOSIS — Z0000 Encounter for general adult medical examination without abnormal findings: Secondary | ICD-10-CM | POA: Diagnosis not present

## 2024-02-12 ENCOUNTER — Other Ambulatory Visit: Payer: 59

## 2024-02-12 ENCOUNTER — Inpatient Hospital Stay: Payer: Self-pay

## 2024-02-19 ENCOUNTER — Ambulatory Visit: Payer: 59 | Admitting: Oncology

## 2024-03-18 ENCOUNTER — Other Ambulatory Visit: Payer: Self-pay

## 2024-03-18 DIAGNOSIS — K7460 Unspecified cirrhosis of liver: Secondary | ICD-10-CM

## 2024-03-29 ENCOUNTER — Telehealth: Payer: Self-pay

## 2024-03-29 NOTE — Telephone Encounter (Signed)
 Rad scheduling called to schedule pt for repeat MRI and [place]. Do you want to

## 2024-03-29 NOTE — Telephone Encounter (Signed)
-----   Message from Nurse Merlynn B sent at 03/16/2023  1:14 PM EDT ----- MRI HCC screening in 1 year.

## 2024-03-29 NOTE — Telephone Encounter (Signed)
 Ordered repeat MRI 1 year follow-up. Rad scheduling contacted pt to schedule and pt told them she has moved out of state.
